# Patient Record
Sex: Male | Born: 1960 | Race: Black or African American | Hispanic: No | Marital: Single
Health system: Southern US, Academic
[De-identification: ages and names within clinical notes are randomized; demographics above are authoritative.]

## PROBLEM LIST (undated history)

## (undated) ENCOUNTER — Telehealth
Attending: Student in an Organized Health Care Education/Training Program | Primary: Student in an Organized Health Care Education/Training Program

## (undated) ENCOUNTER — Ambulatory Visit
Attending: Student in an Organized Health Care Education/Training Program | Primary: Student in an Organized Health Care Education/Training Program

## (undated) ENCOUNTER — Encounter

## (undated) ENCOUNTER — Encounter: Attending: Hematology & Oncology | Primary: Hematology & Oncology

## (undated) ENCOUNTER — Ambulatory Visit

## (undated) ENCOUNTER — Telehealth: Attending: Family | Primary: Family

## (undated) ENCOUNTER — Encounter
Attending: Student in an Organized Health Care Education/Training Program | Primary: Student in an Organized Health Care Education/Training Program

## (undated) ENCOUNTER — Telehealth

## (undated) ENCOUNTER — Ambulatory Visit
Payer: PRIVATE HEALTH INSURANCE | Attending: Student in an Organized Health Care Education/Training Program | Primary: Student in an Organized Health Care Education/Training Program

## (undated) ENCOUNTER — Ambulatory Visit: Payer: PRIVATE HEALTH INSURANCE | Attending: Hospice and Palliative Medicine | Primary: Hospice and Palliative Medicine

## (undated) ENCOUNTER — Ambulatory Visit: Payer: PRIVATE HEALTH INSURANCE | Attending: Registered" | Primary: Registered"

## (undated) ENCOUNTER — Ambulatory Visit: Payer: PRIVATE HEALTH INSURANCE

## (undated) ENCOUNTER — Telehealth: Attending: Hematology & Oncology | Primary: Hematology & Oncology

## (undated) ENCOUNTER — Encounter: Attending: Radiation Oncology | Primary: Radiation Oncology

## (undated) ENCOUNTER — Encounter
Payer: PRIVATE HEALTH INSURANCE | Attending: Student in an Organized Health Care Education/Training Program | Primary: Student in an Organized Health Care Education/Training Program

## (undated) ENCOUNTER — Inpatient Hospital Stay

## (undated) ENCOUNTER — Ambulatory Visit: Attending: Hematology & Oncology | Primary: Hematology & Oncology

## (undated) ENCOUNTER — Telehealth: Attending: Radiation Oncology | Primary: Radiation Oncology

## (undated) ENCOUNTER — Encounter: Payer: PRIVATE HEALTH INSURANCE | Attending: Hospice and Palliative Medicine | Primary: Hospice and Palliative Medicine

## (undated) ENCOUNTER — Ambulatory Visit: Attending: Radiation Oncology | Primary: Radiation Oncology

## (undated) ENCOUNTER — Ambulatory Visit: Payer: PRIVATE HEALTH INSURANCE | Attending: Geriatric Medicine | Primary: Geriatric Medicine

## (undated) ENCOUNTER — Ambulatory Visit: Attending: Surgery | Primary: Surgery

## (undated) DIAGNOSIS — K859 Acute pancreatitis without necrosis or infection, unspecified: Secondary | ICD-10-CM

## (undated) DIAGNOSIS — C801 Malignant (primary) neoplasm, unspecified: Secondary | ICD-10-CM

## (undated) DIAGNOSIS — F410 Panic disorder [episodic paroxysmal anxiety] without agoraphobia: Secondary | ICD-10-CM

## (undated) HISTORY — PX: KNEE SURGERY: SHX244

## (undated) HISTORY — DX: Panic disorder (episodic paroxysmal anxiety): F41.0

## (undated) MED ORDER — ZINC GLUCONATE 50 MG TABLET
Freq: Every day | ORAL | 0.00000 days
Start: ? — End: 2020-12-02

## (undated) MED ORDER — METFORMIN ER 500 MG TABLET,EXTENDED RELEASE 24 HR
Freq: Two times a day (BID) | ORAL | 0.00000 days | Status: SS
Start: ? — End: 2021-01-12

## (undated) MED ORDER — CHOLECALCIFEROL (VITAMIN D3) 10 MCG (400 UNIT) CAPSULE
Freq: Every day | ORAL | 0.00000 days
Start: ? — End: 2020-12-02

---
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
## 2004-04-25 ENCOUNTER — Emergency Department (HOSPITAL_COMMUNITY): Admission: EM | Admit: 2004-04-25 | Discharge: 2004-04-25 | Payer: Self-pay | Admitting: Emergency Medicine

## 2005-09-20 ENCOUNTER — Observation Stay (HOSPITAL_COMMUNITY): Admission: EM | Admit: 2005-09-20 | Discharge: 2005-09-21 | Payer: Self-pay | Admitting: Emergency Medicine

## 2005-09-21 ENCOUNTER — Ambulatory Visit: Payer: Self-pay | Admitting: *Deleted

## 2006-02-26 ENCOUNTER — Emergency Department (HOSPITAL_COMMUNITY): Admission: EM | Admit: 2006-02-26 | Discharge: 2006-02-26 | Payer: Self-pay | Admitting: Emergency Medicine

## 2006-08-25 ENCOUNTER — Ambulatory Visit (HOSPITAL_COMMUNITY): Admission: RE | Admit: 2006-08-25 | Discharge: 2006-08-25 | Payer: Self-pay | Admitting: Family Medicine

## 2007-06-15 ENCOUNTER — Emergency Department (HOSPITAL_COMMUNITY): Admission: EM | Admit: 2007-06-15 | Discharge: 2007-06-15 | Payer: Self-pay | Admitting: Emergency Medicine

## 2007-11-23 DIAGNOSIS — K859 Acute pancreatitis without necrosis or infection, unspecified: Secondary | ICD-10-CM

## 2007-11-23 HISTORY — DX: Acute pancreatitis without necrosis or infection, unspecified: K85.90

## 2007-11-23 HISTORY — PX: COLONOSCOPY: SHX174

## 2009-02-14 ENCOUNTER — Emergency Department (HOSPITAL_COMMUNITY): Admission: EM | Admit: 2009-02-14 | Discharge: 2009-02-14 | Payer: Self-pay | Admitting: Emergency Medicine

## 2009-02-14 ENCOUNTER — Ambulatory Visit: Payer: Self-pay | Admitting: Cardiology

## 2009-02-15 ENCOUNTER — Inpatient Hospital Stay (HOSPITAL_COMMUNITY): Admission: EM | Admit: 2009-02-15 | Discharge: 2009-03-05 | Payer: Self-pay | Admitting: Emergency Medicine

## 2009-02-15 ENCOUNTER — Ambulatory Visit: Payer: Self-pay | Admitting: Gastroenterology

## 2009-02-17 ENCOUNTER — Ambulatory Visit: Payer: Self-pay | Admitting: Gastroenterology

## 2009-02-18 ENCOUNTER — Ambulatory Visit: Payer: Self-pay | Admitting: Gastroenterology

## 2009-02-20 ENCOUNTER — Ambulatory Visit: Payer: Self-pay | Admitting: Gastroenterology

## 2009-02-21 ENCOUNTER — Ambulatory Visit: Payer: Self-pay | Admitting: Gastroenterology

## 2009-02-24 ENCOUNTER — Encounter (INDEPENDENT_AMBULATORY_CARE_PROVIDER_SITE_OTHER): Payer: Self-pay | Admitting: Internal Medicine

## 2009-02-25 ENCOUNTER — Ambulatory Visit: Payer: Self-pay | Admitting: Internal Medicine

## 2009-02-26 ENCOUNTER — Ambulatory Visit: Payer: Self-pay | Admitting: Internal Medicine

## 2009-02-27 ENCOUNTER — Encounter: Payer: Self-pay | Admitting: Gastroenterology

## 2009-02-28 ENCOUNTER — Ambulatory Visit: Payer: Self-pay | Admitting: Gastroenterology

## 2009-03-01 ENCOUNTER — Ambulatory Visit: Payer: Self-pay | Admitting: Internal Medicine

## 2009-03-01 ENCOUNTER — Encounter: Payer: Self-pay | Admitting: Internal Medicine

## 2009-03-03 ENCOUNTER — Ambulatory Visit: Payer: Self-pay | Admitting: Internal Medicine

## 2009-03-04 ENCOUNTER — Ambulatory Visit: Payer: Self-pay | Admitting: Internal Medicine

## 2009-03-10 ENCOUNTER — Encounter (INDEPENDENT_AMBULATORY_CARE_PROVIDER_SITE_OTHER): Payer: Self-pay

## 2009-03-10 ENCOUNTER — Encounter: Payer: Self-pay | Admitting: Internal Medicine

## 2009-03-10 DIAGNOSIS — D649 Anemia, unspecified: Secondary | ICD-10-CM

## 2009-04-22 ENCOUNTER — Telehealth: Payer: Self-pay | Admitting: Gastroenterology

## 2010-01-13 IMAGING — CR DG CHEST 2V
2 series · 2 of 2 positions shown · non-contrast
Comparison: 02/25/2009.

CLINICAL DATA: Pneumonia.  Acute pancreatitis.

CHEST - 2 VIEW

[view not recorded (1 of 2)]
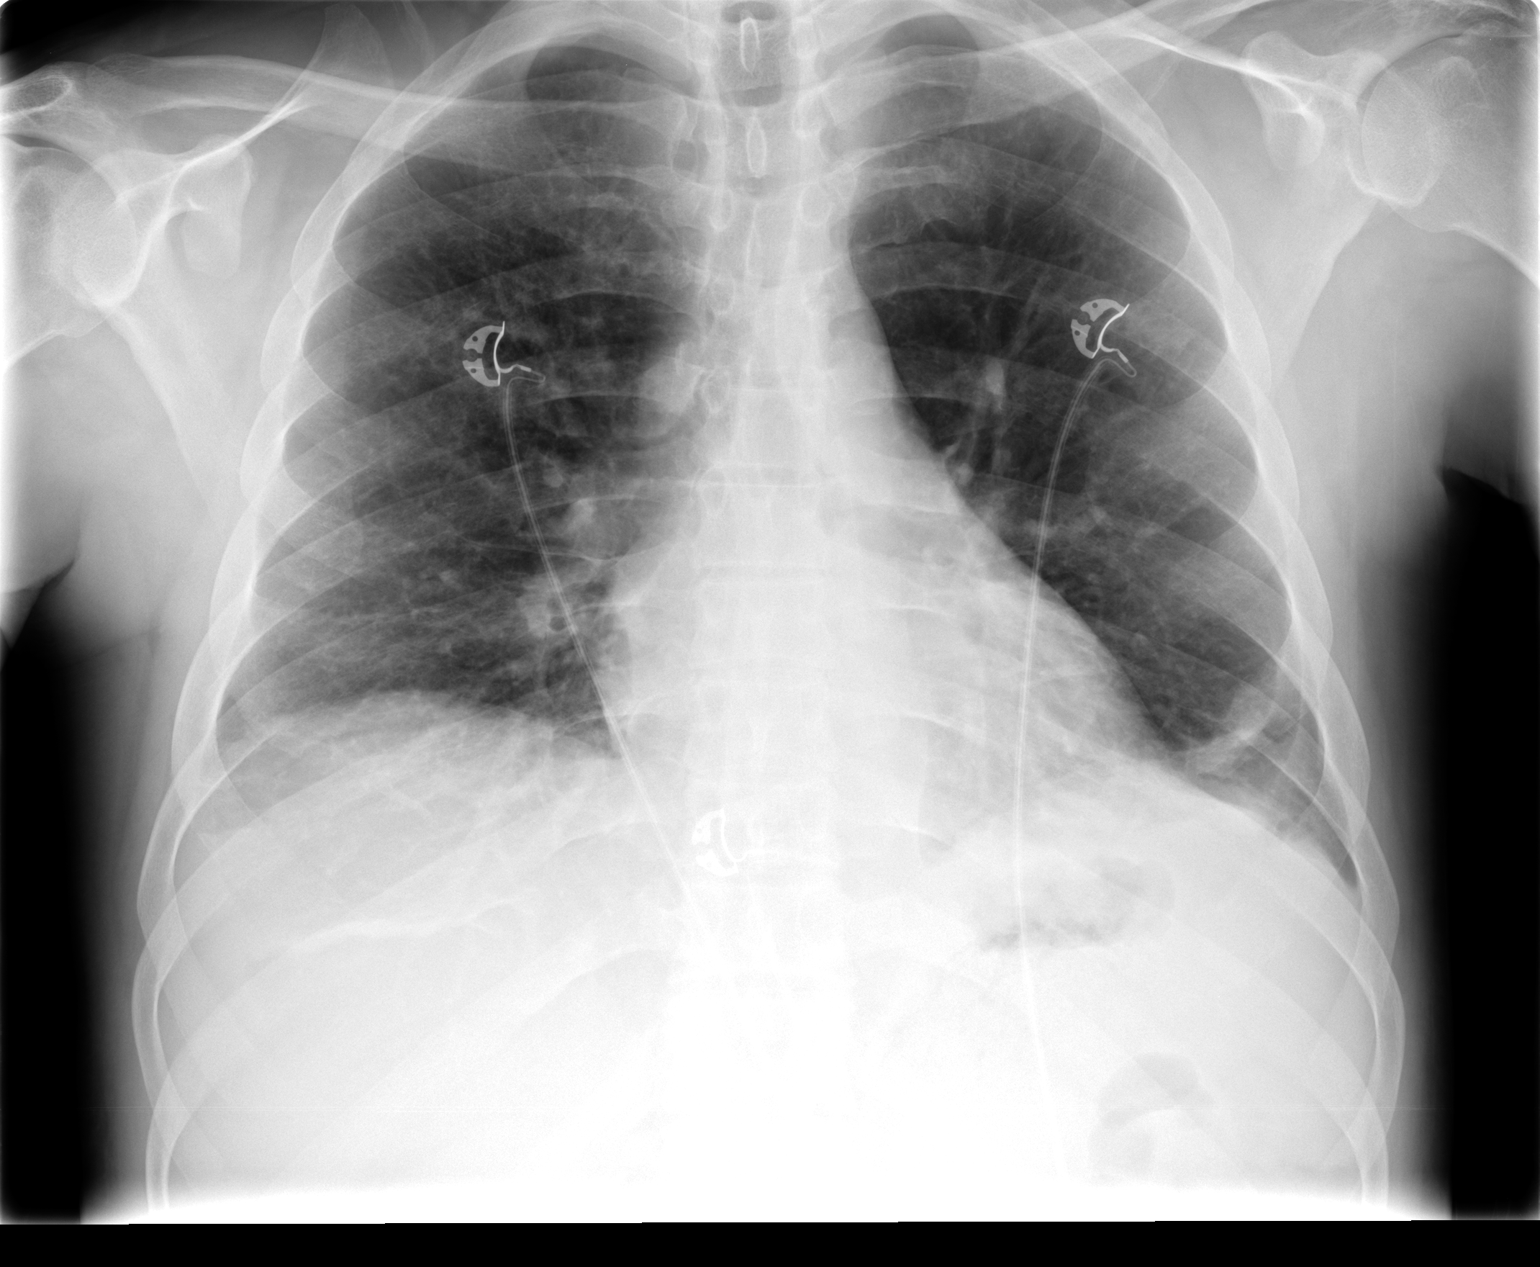

[view not recorded (2 of 2)]
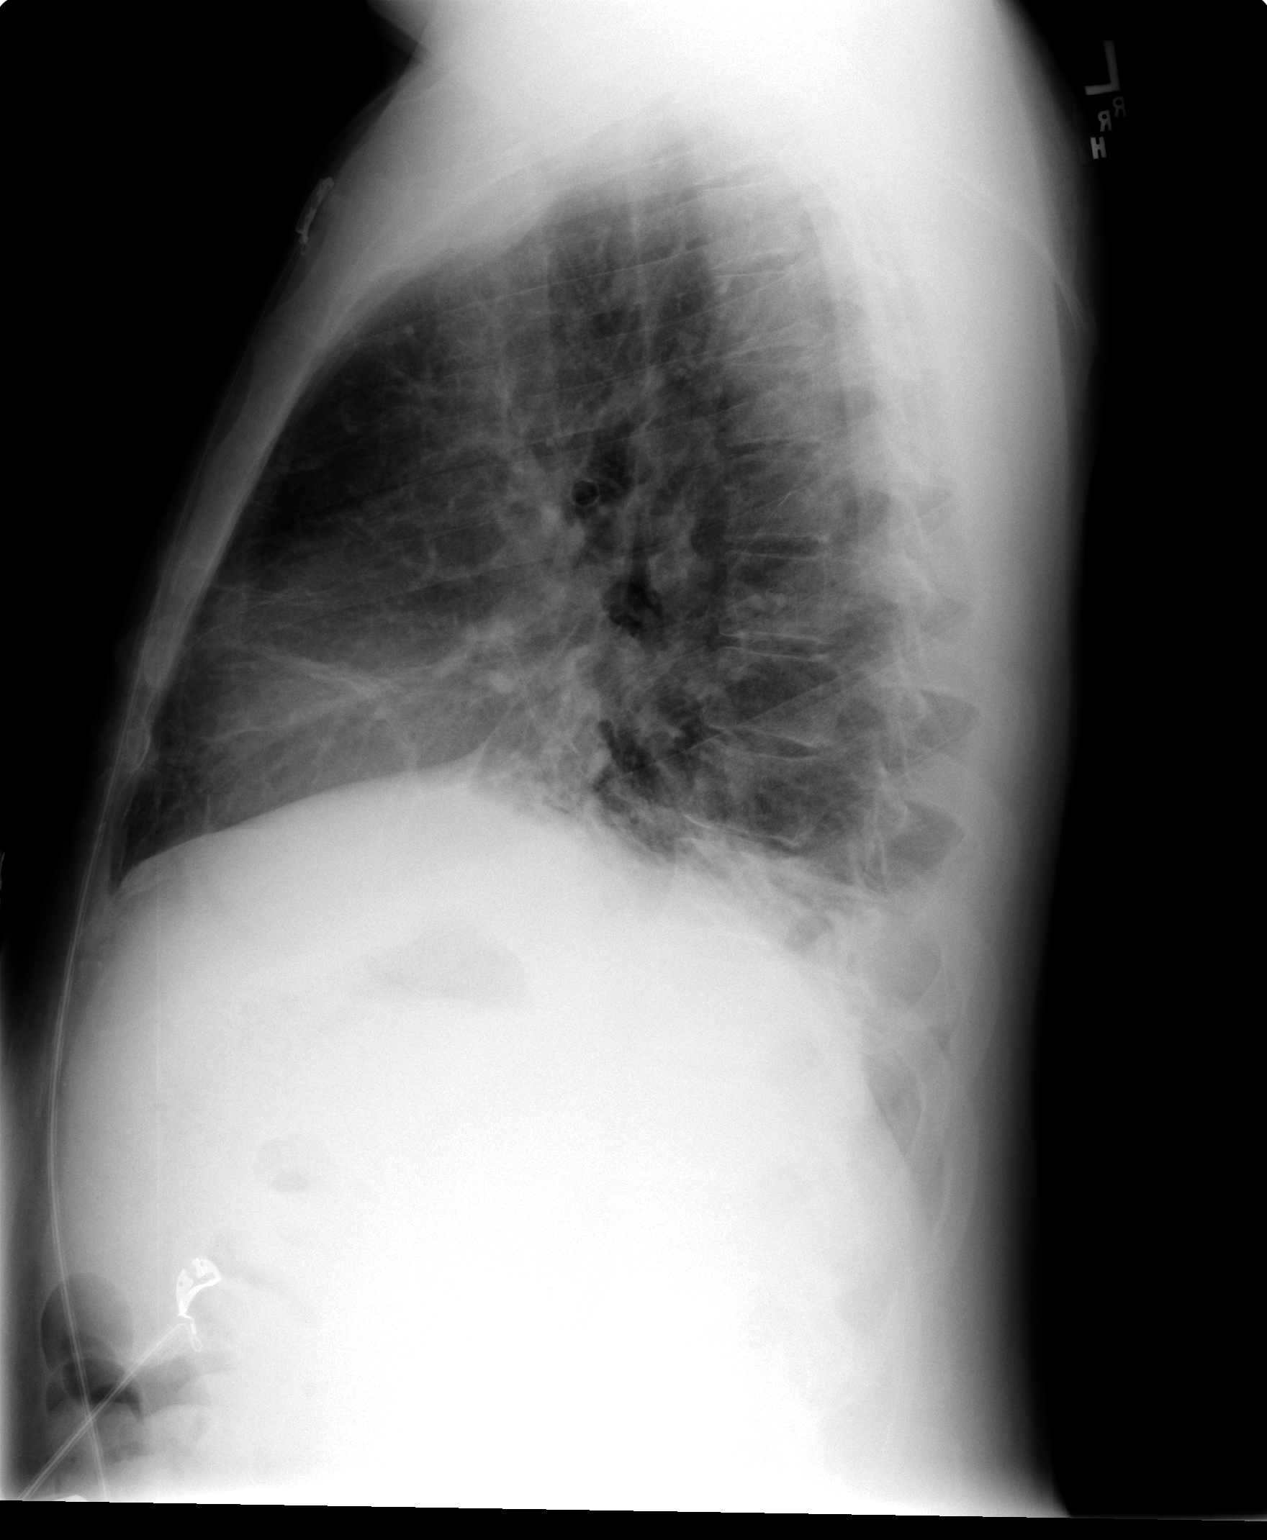

[2 of 2 positions shown; findings below may reference images not displayed]

FINDINGS: Lung volumes are low.  There is bibasilar atelectasis
with tiny bilateral pleural effusions although basilar aeration
does appear improved in the interval.  The diffuse airspace disease
seen previously in the right lung has almost resolved. The
cardiopericardial silhouette is within normal limits for size.
Telemetry leads overlie the chest.
IMPRESSION: Near complete resolution of the fairly diffuse right sided airspace
disease seen previously.

Persistent but improved bibasilar atelectasis and small bilateral
pleural effusions.

## 2010-12-12 ENCOUNTER — Encounter: Payer: Self-pay | Admitting: Family Medicine

## 2011-03-03 LAB — CBC
HCT: 23.5 % — ABNORMAL LOW (ref 39.0–52.0)
HCT: 24 % — ABNORMAL LOW (ref 39.0–52.0)
HCT: 25.1 % — ABNORMAL LOW (ref 39.0–52.0)
HCT: 25.6 % — ABNORMAL LOW (ref 39.0–52.0)
HCT: 26.3 % — ABNORMAL LOW (ref 39.0–52.0)
HCT: 26.4 % — ABNORMAL LOW (ref 39.0–52.0)
HCT: 26.5 % — ABNORMAL LOW (ref 39.0–52.0)
HCT: 26.6 % — ABNORMAL LOW (ref 39.0–52.0)
HCT: 26.9 % — ABNORMAL LOW (ref 39.0–52.0)
HCT: 27 % — ABNORMAL LOW (ref 39.0–52.0)
HCT: 27.1 % — ABNORMAL LOW (ref 39.0–52.0)
HCT: 27.2 % — ABNORMAL LOW (ref 39.0–52.0)
HCT: 27.3 % — ABNORMAL LOW (ref 39.0–52.0)
Hemoglobin: 8.1 g/dL — ABNORMAL LOW (ref 13.0–17.0)
Hemoglobin: 8.4 g/dL — ABNORMAL LOW (ref 13.0–17.0)
Hemoglobin: 8.9 g/dL — ABNORMAL LOW (ref 13.0–17.0)
Hemoglobin: 9 g/dL — ABNORMAL LOW (ref 13.0–17.0)
Hemoglobin: 9.2 g/dL — ABNORMAL LOW (ref 13.0–17.0)
Hemoglobin: 9.2 g/dL — ABNORMAL LOW (ref 13.0–17.0)
Hemoglobin: 9.3 g/dL — ABNORMAL LOW (ref 13.0–17.0)
Hemoglobin: 9.4 g/dL — ABNORMAL LOW (ref 13.0–17.0)
Hemoglobin: 9.4 g/dL — ABNORMAL LOW (ref 13.0–17.0)
Hemoglobin: 9.4 g/dL — ABNORMAL LOW (ref 13.0–17.0)
Hemoglobin: 9.5 g/dL — ABNORMAL LOW (ref 13.0–17.0)
Hemoglobin: 9.6 g/dL — ABNORMAL LOW (ref 13.0–17.0)
MCHC: 33.6 g/dL (ref 30.0–36.0)
MCHC: 34.2 g/dL (ref 30.0–36.0)
MCHC: 34.3 g/dL (ref 30.0–36.0)
MCHC: 34.3 g/dL (ref 30.0–36.0)
MCHC: 34.4 g/dL (ref 30.0–36.0)
MCHC: 34.6 g/dL (ref 30.0–36.0)
MCHC: 34.7 g/dL (ref 30.0–36.0)
MCHC: 34.8 g/dL (ref 30.0–36.0)
MCHC: 34.8 g/dL (ref 30.0–36.0)
MCHC: 34.8 g/dL (ref 30.0–36.0)
MCHC: 34.9 g/dL (ref 30.0–36.0)
MCHC: 35 g/dL (ref 30.0–36.0)
MCHC: 35 g/dL (ref 30.0–36.0)
MCV: 95.1 fL (ref 78.0–100.0)
MCV: 95.8 fL (ref 78.0–100.0)
MCV: 96.1 fL (ref 78.0–100.0)
MCV: 96.3 fL (ref 78.0–100.0)
MCV: 96.3 fL (ref 78.0–100.0)
MCV: 96.6 fL (ref 78.0–100.0)
MCV: 97 fL (ref 78.0–100.0)
MCV: 98.5 fL (ref 78.0–100.0)
MCV: 98.7 fL (ref 78.0–100.0)
MCV: 98.9 fL (ref 78.0–100.0)
MCV: 99.5 fL (ref 78.0–100.0)
MCV: 99.6 fL (ref 78.0–100.0)
MCV: 99.8 fL (ref 78.0–100.0)
Platelets: 340 10*3/uL (ref 150–400)
Platelets: 412 10*3/uL — ABNORMAL HIGH (ref 150–400)
Platelets: 563 10*3/uL — ABNORMAL HIGH (ref 150–400)
Platelets: 617 10*3/uL — ABNORMAL HIGH (ref 150–400)
Platelets: 650 10*3/uL — ABNORMAL HIGH (ref 150–400)
Platelets: 675 10*3/uL — ABNORMAL HIGH (ref 150–400)
Platelets: 676 10*3/uL — ABNORMAL HIGH (ref 150–400)
Platelets: 703 10*3/uL — ABNORMAL HIGH (ref 150–400)
Platelets: 729 10*3/uL — ABNORMAL HIGH (ref 150–400)
Platelets: 744 10*3/uL — ABNORMAL HIGH (ref 150–400)
Platelets: 745 10*3/uL — ABNORMAL HIGH (ref 150–400)
Platelets: 759 10*3/uL — ABNORMAL HIGH (ref 150–400)
RBC: 2.38 MIL/uL — ABNORMAL LOW (ref 4.22–5.81)
RBC: 2.48 MIL/uL — ABNORMAL LOW (ref 4.22–5.81)
RBC: 2.52 MIL/uL — ABNORMAL LOW (ref 4.22–5.81)
RBC: 2.59 MIL/uL — ABNORMAL LOW (ref 4.22–5.81)
RBC: 2.66 MIL/uL — ABNORMAL LOW (ref 4.22–5.81)
RBC: 2.67 MIL/uL — ABNORMAL LOW (ref 4.22–5.81)
RBC: 2.71 MIL/uL — ABNORMAL LOW (ref 4.22–5.81)
RBC: 2.74 MIL/uL — ABNORMAL LOW (ref 4.22–5.81)
RBC: 2.78 MIL/uL — ABNORMAL LOW (ref 4.22–5.81)
RBC: 2.81 MIL/uL — ABNORMAL LOW (ref 4.22–5.81)
RBC: 2.82 MIL/uL — ABNORMAL LOW (ref 4.22–5.81)
RBC: 2.83 MIL/uL — ABNORMAL LOW (ref 4.22–5.81)
RBC: 2.84 MIL/uL — ABNORMAL LOW (ref 4.22–5.81)
RBC: 2.88 MIL/uL — ABNORMAL LOW (ref 4.22–5.81)
RDW: 16.2 % — ABNORMAL HIGH (ref 11.5–15.5)
RDW: 16.5 % — ABNORMAL HIGH (ref 11.5–15.5)
RDW: 16.6 % — ABNORMAL HIGH (ref 11.5–15.5)
RDW: 16.6 % — ABNORMAL HIGH (ref 11.5–15.5)
RDW: 16.7 % — ABNORMAL HIGH (ref 11.5–15.5)
RDW: 16.7 % — ABNORMAL HIGH (ref 11.5–15.5)
RDW: 18.2 % — ABNORMAL HIGH (ref 11.5–15.5)
RDW: 18.5 % — ABNORMAL HIGH (ref 11.5–15.5)
RDW: 18.6 % — ABNORMAL HIGH (ref 11.5–15.5)
RDW: 18.7 % — ABNORMAL HIGH (ref 11.5–15.5)
RDW: 18.7 % — ABNORMAL HIGH (ref 11.5–15.5)
RDW: 18.9 % — ABNORMAL HIGH (ref 11.5–15.5)
WBC: 10.2 10*3/uL (ref 4.0–10.5)
WBC: 11.5 10*3/uL — ABNORMAL HIGH (ref 4.0–10.5)
WBC: 12.3 10*3/uL — ABNORMAL HIGH (ref 4.0–10.5)
WBC: 13.5 10*3/uL — ABNORMAL HIGH (ref 4.0–10.5)
WBC: 13.7 10*3/uL — ABNORMAL HIGH (ref 4.0–10.5)
WBC: 15 10*3/uL — ABNORMAL HIGH (ref 4.0–10.5)
WBC: 16.2 10*3/uL — ABNORMAL HIGH (ref 4.0–10.5)
WBC: 16.7 10*3/uL — ABNORMAL HIGH (ref 4.0–10.5)
WBC: 18.2 10*3/uL — ABNORMAL HIGH (ref 4.0–10.5)
WBC: 19.3 10*3/uL — ABNORMAL HIGH (ref 4.0–10.5)
WBC: 19.3 10*3/uL — ABNORMAL HIGH (ref 4.0–10.5)
WBC: 8.8 10*3/uL (ref 4.0–10.5)
WBC: 9 10*3/uL (ref 4.0–10.5)

## 2011-03-03 LAB — DIFFERENTIAL
Basophils Absolute: 0 10*3/uL (ref 0.0–0.1)
Basophils Absolute: 0 10*3/uL (ref 0.0–0.1)
Basophils Absolute: 0 10*3/uL (ref 0.0–0.1)
Basophils Absolute: 0 10*3/uL (ref 0.0–0.1)
Basophils Absolute: 0 10*3/uL (ref 0.0–0.1)
Basophils Absolute: 0 10*3/uL (ref 0.0–0.1)
Basophils Absolute: 0 10*3/uL (ref 0.0–0.1)
Basophils Absolute: 0.1 10*3/uL (ref 0.0–0.1)
Basophils Absolute: 0.1 10*3/uL (ref 0.0–0.1)
Basophils Absolute: 0.1 10*3/uL (ref 0.0–0.1)
Basophils Absolute: 0.1 10*3/uL (ref 0.0–0.1)
Basophils Absolute: 0.1 10*3/uL (ref 0.0–0.1)
Basophils Absolute: 0.1 10*3/uL (ref 0.0–0.1)
Basophils Relative: 0 % (ref 0–1)
Basophils Relative: 0 % (ref 0–1)
Basophils Relative: 0 % (ref 0–1)
Basophils Relative: 0 % (ref 0–1)
Basophils Relative: 0 % (ref 0–1)
Basophils Relative: 0 % (ref 0–1)
Basophils Relative: 1 % (ref 0–1)
Basophils Relative: 1 % (ref 0–1)
Basophils Relative: 1 % (ref 0–1)
Basophils Relative: 1 % (ref 0–1)
Basophils Relative: 1 % (ref 0–1)
Basophils Relative: 1 % (ref 0–1)
Eosinophils Absolute: 0 10*3/uL (ref 0.0–0.7)
Eosinophils Absolute: 0.1 10*3/uL (ref 0.0–0.7)
Eosinophils Absolute: 0.1 10*3/uL (ref 0.0–0.7)
Eosinophils Absolute: 0.1 10*3/uL (ref 0.0–0.7)
Eosinophils Absolute: 0.1 10*3/uL (ref 0.0–0.7)
Eosinophils Absolute: 0.1 10*3/uL (ref 0.0–0.7)
Eosinophils Absolute: 0.1 10*3/uL (ref 0.0–0.7)
Eosinophils Absolute: 0.2 10*3/uL (ref 0.0–0.7)
Eosinophils Absolute: 0.2 10*3/uL (ref 0.0–0.7)
Eosinophils Absolute: 0.2 10*3/uL (ref 0.0–0.7)
Eosinophils Absolute: 0.2 10*3/uL (ref 0.0–0.7)
Eosinophils Absolute: 0.2 10*3/uL (ref 0.0–0.7)
Eosinophils Relative: 0 % (ref 0–5)
Eosinophils Relative: 1 % (ref 0–5)
Eosinophils Relative: 1 % (ref 0–5)
Eosinophils Relative: 1 % (ref 0–5)
Eosinophils Relative: 1 % (ref 0–5)
Eosinophils Relative: 1 % (ref 0–5)
Eosinophils Relative: 1 % (ref 0–5)
Eosinophils Relative: 1 % (ref 0–5)
Eosinophils Relative: 1 % (ref 0–5)
Eosinophils Relative: 1 % (ref 0–5)
Eosinophils Relative: 1 % (ref 0–5)
Eosinophils Relative: 2 % (ref 0–5)
Lymphocytes Relative: 10 % — ABNORMAL LOW (ref 12–46)
Lymphocytes Relative: 11 % — ABNORMAL LOW (ref 12–46)
Lymphocytes Relative: 11 % — ABNORMAL LOW (ref 12–46)
Lymphocytes Relative: 14 % (ref 12–46)
Lymphocytes Relative: 14 % (ref 12–46)
Lymphocytes Relative: 16 % (ref 12–46)
Lymphocytes Relative: 18 % (ref 12–46)
Lymphocytes Relative: 21 % (ref 12–46)
Lymphocytes Relative: 5 % — ABNORMAL LOW (ref 12–46)
Lymphocytes Relative: 5 % — ABNORMAL LOW (ref 12–46)
Lymphocytes Relative: 5 % — ABNORMAL LOW (ref 12–46)
Lymphocytes Relative: 5 % — ABNORMAL LOW (ref 12–46)
Lymphocytes Relative: 6 % — ABNORMAL LOW (ref 12–46)
Lymphocytes Relative: 7 % — ABNORMAL LOW (ref 12–46)
Lymphs Abs: 0.8 10*3/uL (ref 0.7–4.0)
Lymphs Abs: 0.8 10*3/uL (ref 0.7–4.0)
Lymphs Abs: 0.9 10*3/uL (ref 0.7–4.0)
Lymphs Abs: 1 10*3/uL (ref 0.7–4.0)
Lymphs Abs: 1.1 10*3/uL (ref 0.7–4.0)
Lymphs Abs: 1.2 10*3/uL (ref 0.7–4.0)
Lymphs Abs: 1.4 10*3/uL (ref 0.7–4.0)
Lymphs Abs: 1.4 10*3/uL (ref 0.7–4.0)
Lymphs Abs: 1.4 10*3/uL (ref 0.7–4.0)
Lymphs Abs: 1.4 10*3/uL (ref 0.7–4.0)
Lymphs Abs: 1.5 10*3/uL (ref 0.7–4.0)
Lymphs Abs: 1.5 10*3/uL (ref 0.7–4.0)
Lymphs Abs: 1.6 10*3/uL (ref 0.7–4.0)
Lymphs Abs: 1.7 10*3/uL (ref 0.7–4.0)
Monocytes Absolute: 0.3 10*3/uL (ref 0.1–1.0)
Monocytes Absolute: 0.5 10*3/uL (ref 0.1–1.0)
Monocytes Absolute: 0.6 10*3/uL (ref 0.1–1.0)
Monocytes Absolute: 0.6 10*3/uL (ref 0.1–1.0)
Monocytes Absolute: 0.6 10*3/uL (ref 0.1–1.0)
Monocytes Absolute: 0.7 10*3/uL (ref 0.1–1.0)
Monocytes Absolute: 0.7 10*3/uL (ref 0.1–1.0)
Monocytes Absolute: 0.8 10*3/uL (ref 0.1–1.0)
Monocytes Absolute: 0.8 10*3/uL (ref 0.1–1.0)
Monocytes Absolute: 0.8 10*3/uL (ref 0.1–1.0)
Monocytes Absolute: 0.8 10*3/uL (ref 0.1–1.0)
Monocytes Absolute: 0.8 10*3/uL (ref 0.1–1.0)
Monocytes Absolute: 0.9 10*3/uL (ref 0.1–1.0)
Monocytes Relative: 10 % (ref 3–12)
Monocytes Relative: 2 % — ABNORMAL LOW (ref 3–12)
Monocytes Relative: 3 % (ref 3–12)
Monocytes Relative: 4 % (ref 3–12)
Monocytes Relative: 4 % (ref 3–12)
Monocytes Relative: 5 % (ref 3–12)
Monocytes Relative: 6 % (ref 3–12)
Monocytes Relative: 6 % (ref 3–12)
Monocytes Relative: 6 % (ref 3–12)
Monocytes Relative: 6 % (ref 3–12)
Monocytes Relative: 7 % (ref 3–12)
Monocytes Relative: 7 % (ref 3–12)
Monocytes Relative: 9 % (ref 3–12)
Neutro Abs: 10 10*3/uL — ABNORMAL HIGH (ref 1.7–7.7)
Neutro Abs: 11.1 10*3/uL — ABNORMAL HIGH (ref 1.7–7.7)
Neutro Abs: 11.2 10*3/uL — ABNORMAL HIGH (ref 1.7–7.7)
Neutro Abs: 13.1 10*3/uL — ABNORMAL HIGH (ref 1.7–7.7)
Neutro Abs: 14.6 10*3/uL — ABNORMAL HIGH (ref 1.7–7.7)
Neutro Abs: 14.9 10*3/uL — ABNORMAL HIGH (ref 1.7–7.7)
Neutro Abs: 16.7 10*3/uL — ABNORMAL HIGH (ref 1.7–7.7)
Neutro Abs: 17.2 10*3/uL — ABNORMAL HIGH (ref 1.7–7.7)
Neutro Abs: 17.4 10*3/uL — ABNORMAL HIGH (ref 1.7–7.7)
Neutro Abs: 5.2 10*3/uL (ref 1.7–7.7)
Neutro Abs: 6.3 10*3/uL (ref 1.7–7.7)
Neutro Abs: 6.9 10*3/uL (ref 1.7–7.7)
Neutro Abs: 7.9 10*3/uL — ABNORMAL HIGH (ref 1.7–7.7)
Neutro Abs: 9 10*3/uL — ABNORMAL HIGH (ref 1.7–7.7)
Neutrophils Relative %: 72 % (ref 43–77)
Neutrophils Relative %: 76 % (ref 43–77)
Neutrophils Relative %: 78 % — ABNORMAL HIGH (ref 43–77)
Neutrophils Relative %: 78 % — ABNORMAL HIGH (ref 43–77)
Neutrophils Relative %: 82 % — ABNORMAL HIGH (ref 43–77)
Neutrophils Relative %: 82 % — ABNORMAL HIGH (ref 43–77)
Neutrophils Relative %: 83 % — ABNORMAL HIGH (ref 43–77)
Neutrophils Relative %: 87 % — ABNORMAL HIGH (ref 43–77)
Neutrophils Relative %: 89 % — ABNORMAL HIGH (ref 43–77)
Neutrophils Relative %: 89 % — ABNORMAL HIGH (ref 43–77)
Neutrophils Relative %: 90 % — ABNORMAL HIGH (ref 43–77)
Neutrophils Relative %: 91 % — ABNORMAL HIGH (ref 43–77)
Neutrophils Relative %: 92 % — ABNORMAL HIGH (ref 43–77)

## 2011-03-03 LAB — BASIC METABOLIC PANEL
BUN: 2 mg/dL — ABNORMAL LOW (ref 6–23)
BUN: 3 mg/dL — ABNORMAL LOW (ref 6–23)
BUN: 5 mg/dL — ABNORMAL LOW (ref 6–23)
BUN: 5 mg/dL — ABNORMAL LOW (ref 6–23)
BUN: 5 mg/dL — ABNORMAL LOW (ref 6–23)
BUN: 5 mg/dL — ABNORMAL LOW (ref 6–23)
BUN: 5 mg/dL — ABNORMAL LOW (ref 6–23)
BUN: 5 mg/dL — ABNORMAL LOW (ref 6–23)
BUN: 6 mg/dL (ref 6–23)
BUN: 6 mg/dL (ref 6–23)
CO2: 22 mEq/L (ref 19–32)
CO2: 22 mEq/L (ref 19–32)
CO2: 23 mEq/L (ref 19–32)
CO2: 23 mEq/L (ref 19–32)
CO2: 24 mEq/L (ref 19–32)
CO2: 25 mEq/L (ref 19–32)
CO2: 26 mEq/L (ref 19–32)
CO2: 27 mEq/L (ref 19–32)
CO2: 28 mEq/L (ref 19–32)
CO2: 28 mEq/L (ref 19–32)
Calcium: 8 mg/dL — ABNORMAL LOW (ref 8.4–10.5)
Calcium: 8 mg/dL — ABNORMAL LOW (ref 8.4–10.5)
Calcium: 8.1 mg/dL — ABNORMAL LOW (ref 8.4–10.5)
Calcium: 8.1 mg/dL — ABNORMAL LOW (ref 8.4–10.5)
Calcium: 8.3 mg/dL — ABNORMAL LOW (ref 8.4–10.5)
Calcium: 8.3 mg/dL — ABNORMAL LOW (ref 8.4–10.5)
Calcium: 8.3 mg/dL — ABNORMAL LOW (ref 8.4–10.5)
Calcium: 8.4 mg/dL (ref 8.4–10.5)
Calcium: 8.6 mg/dL (ref 8.4–10.5)
Calcium: 8.6 mg/dL (ref 8.4–10.5)
Chloride: 101 mEq/L (ref 96–112)
Chloride: 103 mEq/L (ref 96–112)
Chloride: 104 mEq/L (ref 96–112)
Chloride: 104 mEq/L (ref 96–112)
Chloride: 104 mEq/L (ref 96–112)
Chloride: 106 mEq/L (ref 96–112)
Chloride: 106 mEq/L (ref 96–112)
Chloride: 106 mEq/L (ref 96–112)
Chloride: 106 mEq/L (ref 96–112)
Chloride: 108 mEq/L (ref 96–112)
Creatinine, Ser: 0.76 mg/dL (ref 0.4–1.5)
Creatinine, Ser: 0.85 mg/dL (ref 0.4–1.5)
Creatinine, Ser: 0.86 mg/dL (ref 0.4–1.5)
Creatinine, Ser: 0.9 mg/dL (ref 0.4–1.5)
Creatinine, Ser: 0.9 mg/dL (ref 0.4–1.5)
Creatinine, Ser: 0.92 mg/dL (ref 0.4–1.5)
Creatinine, Ser: 0.96 mg/dL (ref 0.4–1.5)
Creatinine, Ser: 0.96 mg/dL (ref 0.4–1.5)
Creatinine, Ser: 0.97 mg/dL (ref 0.4–1.5)
Creatinine, Ser: 0.98 mg/dL (ref 0.4–1.5)
GFR calc Af Amer: >60 mL/min (ref >60)
GFR calc Af Amer: >60 mL/min (ref >60)
GFR calc Af Amer: >60 mL/min (ref >60)
GFR calc Af Amer: >60 mL/min (ref >60)
GFR calc Af Amer: >60 mL/min (ref >60)
GFR calc Af Amer: >60 mL/min (ref >60)
GFR calc Af Amer: >60 mL/min (ref >60)
GFR calc Af Amer: >60 mL/min (ref >60)
GFR calc Af Amer: >60 mL/min (ref >60)
GFR calc Af Amer: >60 mL/min (ref >60)
GFR calc non Af Amer: >60 mL/min (ref >60)
GFR calc non Af Amer: >60 mL/min (ref >60)
GFR calc non Af Amer: >60 mL/min (ref >60)
GFR calc non Af Amer: >60 mL/min (ref >60)
GFR calc non Af Amer: >60 mL/min (ref >60)
GFR calc non Af Amer: >60 mL/min (ref >60)
GFR calc non Af Amer: >60 mL/min (ref >60)
GFR calc non Af Amer: >60 mL/min (ref >60)
GFR calc non Af Amer: >60 mL/min (ref >60)
GFR calc non Af Amer: >60 mL/min (ref >60)
Glucose, Bld: 121 mg/dL — ABNORMAL HIGH (ref 70–99)
Glucose, Bld: 127 mg/dL — ABNORMAL HIGH (ref 70–99)
Glucose, Bld: 128 mg/dL — ABNORMAL HIGH (ref 70–99)
Glucose, Bld: 129 mg/dL — ABNORMAL HIGH (ref 70–99)
Glucose, Bld: 136 mg/dL — ABNORMAL HIGH (ref 70–99)
Glucose, Bld: 147 mg/dL — ABNORMAL HIGH (ref 70–99)
Glucose, Bld: 149 mg/dL — ABNORMAL HIGH (ref 70–99)
Glucose, Bld: 157 mg/dL — ABNORMAL HIGH (ref 70–99)
Glucose, Bld: 173 mg/dL — ABNORMAL HIGH (ref 70–99)
Glucose, Bld: 178 mg/dL — ABNORMAL HIGH (ref 70–99)
Potassium: 3.1 mEq/L — ABNORMAL LOW (ref 3.5–5.1)
Potassium: 3.3 mEq/L — ABNORMAL LOW (ref 3.5–5.1)
Potassium: 3.4 mEq/L — ABNORMAL LOW (ref 3.5–5.1)
Potassium: 3.4 mEq/L — ABNORMAL LOW (ref 3.5–5.1)
Potassium: 3.5 mEq/L (ref 3.5–5.1)
Potassium: 3.6 mEq/L (ref 3.5–5.1)
Potassium: 3.6 mEq/L (ref 3.5–5.1)
Potassium: 3.7 mEq/L (ref 3.5–5.1)
Potassium: 4.3 mEq/L (ref 3.5–5.1)
Potassium: 4.4 mEq/L (ref 3.5–5.1)
Sodium: 133 mEq/L — ABNORMAL LOW (ref 135–145)
Sodium: 135 mEq/L (ref 135–145)
Sodium: 135 mEq/L (ref 135–145)
Sodium: 136 mEq/L (ref 135–145)
Sodium: 136 mEq/L (ref 135–145)
Sodium: 136 mEq/L (ref 135–145)
Sodium: 136 mEq/L (ref 135–145)
Sodium: 136 mEq/L (ref 135–145)
Sodium: 136 mEq/L (ref 135–145)
Sodium: 137 mEq/L (ref 135–145)

## 2011-03-03 LAB — URINALYSIS, ROUTINE W REFLEX MICROSCOPIC
Bilirubin Urine: NEGATIVE
Glucose, UA: 100 mg/dL — AB
Ketones, ur: NEGATIVE mg/dL
Leukocytes, UA: NEGATIVE
Protein, ur: 30 mg/dL — AB

## 2011-03-03 LAB — HEPATIC FUNCTION PANEL
ALT: 9 U/L (ref 0–53)
Alkaline Phosphatase: 40 U/L (ref 39–117)
Indirect Bilirubin: 0.7 mg/dL (ref 0.3–0.9)
Total Bilirubin: 1 mg/dL (ref 0.3–1.2)
Total Protein: 6.3 g/dL (ref 6.0–8.3)

## 2011-03-03 LAB — IRON AND TIBC
Saturation Ratios: 40 % (ref 20–55)
UIBC: 90 ug/dL

## 2011-03-03 LAB — CLOSTRIDIUM DIFFICILE EIA: C difficile Toxins A+B, EIA: NEGATIVE

## 2011-03-03 LAB — COMPREHENSIVE METABOLIC PANEL
ALT: 9 U/L (ref 0–53)
AST: 30 U/L (ref 0–37)
Albumin: 2.2 g/dL — ABNORMAL LOW (ref 3.5–5.2)
Alkaline Phosphatase: 59 U/L (ref 39–117)
BUN: 2 mg/dL — ABNORMAL LOW (ref 6–23)
BUN: 5 mg/dL — ABNORMAL LOW (ref 6–23)
CO2: 22 mEq/L (ref 19–32)
CO2: 28 mEq/L (ref 19–32)
Calcium: 8.2 mg/dL — ABNORMAL LOW (ref 8.4–10.5)
Calcium: 8.3 mg/dL — ABNORMAL LOW (ref 8.4–10.5)
Chloride: 104 mEq/L (ref 96–112)
Chloride: 98 mEq/L (ref 96–112)
Creatinine, Ser: 0.82 mg/dL (ref 0.4–1.5)
Creatinine, Ser: 0.95 mg/dL (ref 0.4–1.5)
GFR calc Af Amer: >60 mL/min (ref >60)
GFR calc non Af Amer: >60 mL/min (ref >60)
GFR calc non Af Amer: >60 mL/min (ref >60)
Glucose, Bld: 118 mg/dL — ABNORMAL HIGH (ref 70–99)
Potassium: 3.9 mEq/L (ref 3.5–5.1)
Sodium: 132 mEq/L — ABNORMAL LOW (ref 135–145)
Total Bilirubin: 0.9 mg/dL (ref 0.3–1.2)
Total Bilirubin: 0.9 mg/dL (ref 0.3–1.2)
Total Protein: 7.1 g/dL (ref 6.0–8.3)

## 2011-03-03 LAB — OCCULT BLOOD (STOOL CUP TO LAB): Fecal Occult Bld: NEGATIVE

## 2011-03-03 LAB — CROSSMATCH
ABO/RH(D): O POS
ABO/RH(D): O POS
Antibody Screen: NEGATIVE

## 2011-03-03 LAB — LIPASE, BLOOD
Lipase: 24 U/L (ref 11–59)
Lipase: 31 U/L (ref 11–59)

## 2011-03-03 LAB — CULTURE, BLOOD (ROUTINE X 2)
Culture: NO GROWTH
Report Status: 4062010

## 2011-03-03 LAB — HEMOGLOBIN AND HEMATOCRIT, BLOOD
HCT: 27.7 % — ABNORMAL LOW (ref 39.0–52.0)
Hemoglobin: 9.6 g/dL — ABNORMAL LOW (ref 13.0–17.0)

## 2011-03-03 LAB — HEMOGLOBIN A1C
Hgb A1c MFr Bld: 5.5 % (ref 4.6–6.1)
Mean Plasma Glucose: 111 mg/dL

## 2011-03-03 LAB — VANCOMYCIN, TROUGH
Vancomycin Tr: 12.7 ug/mL (ref 10.0–20.0)
Vancomycin Tr: <5 ug/mL — ABNORMAL LOW (ref 10.0–20.0)

## 2011-03-03 LAB — URINE CULTURE: Colony Count: NO GROWTH

## 2011-03-03 LAB — URINE MICROSCOPIC-ADD ON

## 2011-03-03 LAB — HIV ANTIBODY (ROUTINE TESTING W REFLEX): HIV: NONREACTIVE

## 2011-03-03 LAB — RETICULOCYTES: Retic Ct Pct: 3.4 % — ABNORMAL HIGH (ref 0.4–3.1)

## 2011-03-03 LAB — MAGNESIUM: Magnesium: 1.6 mg/dL (ref 1.5–2.5)

## 2011-03-03 LAB — POTASSIUM: Potassium: 3.2 mEq/L — ABNORMAL LOW (ref 3.5–5.1)

## 2011-03-03 LAB — B. BURGDORFI ANTIBODIES: B burgdorferi Ab IgG+IgM: 0.1 {ISR}

## 2011-03-04 LAB — DIFFERENTIAL
Band Neutrophils: 20 % — ABNORMAL HIGH (ref 0–10)
Band Neutrophils: 8 % (ref 0–10)
Basophils Absolute: 0 10*3/uL (ref 0.0–0.1)
Basophils Absolute: 0 10*3/uL (ref 0.0–0.1)
Basophils Relative: 0 % (ref 0–1)
Basophils Relative: 0 % (ref 0–1)
Basophils Relative: 0 % (ref 0–1)
Basophils Relative: 0 % (ref 0–1)
Blasts: 0 %
Eosinophils Absolute: 0 10*3/uL (ref 0.0–0.7)
Eosinophils Absolute: 0 10*3/uL (ref 0.0–0.7)
Eosinophils Absolute: 0.1 10*3/uL (ref 0.0–0.7)
Eosinophils Relative: 0 % (ref 0–5)
Eosinophils Relative: 1 % (ref 0–5)
Eosinophils Relative: 1 % (ref 0–5)
Lymphocytes Relative: 12 % (ref 12–46)
Lymphocytes Relative: 6 % — ABNORMAL LOW (ref 12–46)
Lymphocytes Relative: 8 % — ABNORMAL LOW (ref 12–46)
Lymphs Abs: 0.9 10*3/uL (ref 0.7–4.0)
Lymphs Abs: 1 10*3/uL (ref 0.7–4.0)
Lymphs Abs: 1.3 10*3/uL (ref 0.7–4.0)
Lymphs Abs: 1.8 10*3/uL (ref 0.7–4.0)
Monocytes Absolute: 0.8 10*3/uL (ref 0.1–1.0)
Monocytes Relative: 6 % (ref 3–12)
Monocytes Relative: 7 % (ref 3–12)
Monocytes Relative: 7 % (ref 3–12)
Myelocytes: 0 %
Neutro Abs: 13.3 10*3/uL — ABNORMAL HIGH (ref 1.7–7.7)
Neutro Abs: 15.8 10*3/uL — ABNORMAL HIGH (ref 1.7–7.7)
Neutro Abs: 9.1 10*3/uL — ABNORMAL HIGH (ref 1.7–7.7)
Neutrophils Relative %: 86 % — ABNORMAL HIGH (ref 43–77)
Neutrophils Relative %: 87 % — ABNORMAL HIGH (ref 43–77)
Neutrophils Relative %: 91 % — ABNORMAL HIGH (ref 43–77)
Promyelocytes Absolute: 0 %
WBC Morphology: INCREASED

## 2011-03-04 LAB — LEGIONELLA ANTIGEN, URINE: Legionella Antigen, Urine: NEGATIVE

## 2011-03-04 LAB — CROSSMATCH
ABO/RH(D): O POS
Antibody Screen: NEGATIVE

## 2011-03-04 LAB — CULTURE, BLOOD (ROUTINE X 2)
Culture: NO GROWTH
Culture: NO GROWTH
Report Status: 4012010
Report Status: 4032010
Report Status: 4032010

## 2011-03-04 LAB — HEPATIC FUNCTION PANEL
ALT: 13 U/L (ref 0–53)
AST: 47 U/L — ABNORMAL HIGH (ref 0–37)
Albumin: 2.9 g/dL — ABNORMAL LOW (ref 3.5–5.2)
Alkaline Phosphatase: 47 U/L (ref 39–117)
Bilirubin, Direct: 0.4 mg/dL — ABNORMAL HIGH (ref 0.0–0.3)
Indirect Bilirubin: 1.3 mg/dL — ABNORMAL HIGH (ref 0.3–0.9)
Total Bilirubin: 1.7 mg/dL — ABNORMAL HIGH (ref 0.3–1.2)
Total Protein: 7.5 g/dL (ref 6.0–8.3)

## 2011-03-04 LAB — HEMOGLOBIN AND HEMATOCRIT, BLOOD
HCT: 25.5 % — ABNORMAL LOW (ref 39.0–52.0)
HCT: 27.9 % — ABNORMAL LOW (ref 39.0–52.0)
HCT: 29.1 % — ABNORMAL LOW (ref 39.0–52.0)
HCT: 29.6 % — ABNORMAL LOW (ref 39.0–52.0)
Hemoglobin: 10.1 g/dL — ABNORMAL LOW (ref 13.0–17.0)
Hemoglobin: 10.4 g/dL — ABNORMAL LOW (ref 13.0–17.0)
Hemoglobin: 8.9 g/dL — ABNORMAL LOW (ref 13.0–17.0)
Hemoglobin: 9.8 g/dL — ABNORMAL LOW (ref 13.0–17.0)

## 2011-03-04 LAB — CBC
HCT: 36.9 % — ABNORMAL LOW (ref 39.0–52.0)
HCT: 37.7 % — ABNORMAL LOW (ref 39.0–52.0)
HCT: 37.8 % — ABNORMAL LOW (ref 39.0–52.0)
Hemoglobin: 12.8 g/dL — ABNORMAL LOW (ref 13.0–17.0)
Hemoglobin: 9.4 g/dL — ABNORMAL LOW (ref 13.0–17.0)
MCHC: 34.5 g/dL (ref 30.0–36.0)
MCHC: 35.1 g/dL (ref 30.0–36.0)
MCHC: 35.2 g/dL (ref 30.0–36.0)
MCHC: 35.3 g/dL (ref 30.0–36.0)
MCV: 101.1 fL — ABNORMAL HIGH (ref 78.0–100.0)
MCV: 102.7 fL — ABNORMAL HIGH (ref 78.0–100.0)
Platelets: 203 10*3/uL (ref 150–400)
Platelets: 218 10*3/uL (ref 150–400)
Platelets: 237 10*3/uL (ref 150–400)
Platelets: 285 10*3/uL (ref 150–400)
RBC: 2.66 MIL/uL — ABNORMAL LOW (ref 4.22–5.81)
RBC: 2.94 MIL/uL — ABNORMAL LOW (ref 4.22–5.81)
RBC: 3.6 MIL/uL — ABNORMAL LOW (ref 4.22–5.81)
RDW: 12.9 % (ref 11.5–15.5)
RDW: 13.5 % (ref 11.5–15.5)
RDW: 13.8 % (ref 11.5–15.5)
WBC: 10.2 10*3/uL (ref 4.0–10.5)
WBC: 18.3 10*3/uL — ABNORMAL HIGH (ref 4.0–10.5)

## 2011-03-04 LAB — COMPREHENSIVE METABOLIC PANEL
ALT: 10 U/L (ref 0–53)
ALT: 18 U/L (ref 0–53)
ALT: 8 U/L (ref 0–53)
AST: 24 U/L (ref 0–37)
AST: 48 U/L — ABNORMAL HIGH (ref 0–37)
Albumin: 2 g/dL — ABNORMAL LOW (ref 3.5–5.2)
Albumin: 2.3 g/dL — ABNORMAL LOW (ref 3.5–5.2)
Albumin: 3.7 g/dL (ref 3.5–5.2)
Alkaline Phosphatase: 27 U/L — ABNORMAL LOW (ref 39–117)
Alkaline Phosphatase: 33 U/L — ABNORMAL LOW (ref 39–117)
Alkaline Phosphatase: 64 U/L (ref 39–117)
BUN: 31 mg/dL — ABNORMAL HIGH (ref 6–23)
BUN: 4 mg/dL — ABNORMAL LOW (ref 6–23)
BUN: 7 mg/dL (ref 6–23)
CO2: 23 mEq/L (ref 19–32)
CO2: 24 mEq/L (ref 19–32)
CO2: 27 mEq/L (ref 19–32)
Calcium: 8 mg/dL — ABNORMAL LOW (ref 8.4–10.5)
Calcium: 9.6 mg/dL (ref 8.4–10.5)
Chloride: 101 mEq/L (ref 96–112)
Chloride: 97 mEq/L (ref 96–112)
Creatinine, Ser: 0.87 mg/dL (ref 0.4–1.5)
Creatinine, Ser: 1.7 mg/dL — ABNORMAL HIGH (ref 0.4–1.5)
GFR calc Af Amer: >60 mL/min (ref >60)
GFR calc Af Amer: >60 mL/min (ref >60)
GFR calc Af Amer: >60 mL/min (ref >60)
GFR calc non Af Amer: >60 mL/min (ref >60)
GFR calc non Af Amer: >60 mL/min (ref >60)
GFR calc non Af Amer: >60 mL/min (ref >60)
Glucose, Bld: 135 mg/dL — ABNORMAL HIGH (ref 70–99)
Glucose, Bld: 135 mg/dL — ABNORMAL HIGH (ref 70–99)
Potassium: 3.1 mEq/L — ABNORMAL LOW (ref 3.5–5.1)
Potassium: 3.4 mEq/L — ABNORMAL LOW (ref 3.5–5.1)
Potassium: 3.8 mEq/L (ref 3.5–5.1)
Sodium: 129 mEq/L — ABNORMAL LOW (ref 135–145)
Sodium: 133 mEq/L — ABNORMAL LOW (ref 135–145)
Sodium: 135 mEq/L (ref 135–145)
Total Bilirubin: 1.4 mg/dL — ABNORMAL HIGH (ref 0.3–1.2)
Total Bilirubin: 1.8 mg/dL — ABNORMAL HIGH (ref 0.3–1.2)
Total Protein: 5.8 g/dL — ABNORMAL LOW (ref 6.0–8.3)
Total Protein: 5.9 g/dL — ABNORMAL LOW (ref 6.0–8.3)
Total Protein: 6.8 g/dL (ref 6.0–8.3)
Total Protein: 8.1 g/dL (ref 6.0–8.3)

## 2011-03-04 LAB — LIPASE, BLOOD
Lipase: 151 U/L — ABNORMAL HIGH (ref 11–59)
Lipase: 180 U/L — ABNORMAL HIGH (ref 11–59)
Lipase: 393 U/L — ABNORMAL HIGH (ref 11–59)
Lipase: 78 U/L — ABNORMAL HIGH (ref 11–59)

## 2011-03-04 LAB — BLOOD GAS, ARTERIAL
Acid-base deficit: 3.3 mmol/L — ABNORMAL HIGH (ref 0.0–2.0)
O2 Content: 4 L/min
O2 Saturation: 91.8 %
Patient temperature: 37
pO2, Arterial: 60.7 mmHg — ABNORMAL LOW (ref 80.0–100.0)

## 2011-03-04 LAB — BASIC METABOLIC PANEL
BUN: 11 mg/dL (ref 6–23)
Calcium: 7.9 mg/dL — ABNORMAL LOW (ref 8.4–10.5)
Calcium: 8.1 mg/dL — ABNORMAL LOW (ref 8.4–10.5)
Creatinine, Ser: 1.04 mg/dL (ref 0.4–1.5)
GFR calc Af Amer: >60 mL/min (ref >60)
GFR calc Af Amer: >60 mL/min (ref >60)
GFR calc non Af Amer: >60 mL/min (ref >60)
GFR calc non Af Amer: >60 mL/min (ref >60)
GFR calc non Af Amer: >60 mL/min (ref >60)
Potassium: 3.4 mEq/L — ABNORMAL LOW (ref 3.5–5.1)
Potassium: 3.9 mEq/L (ref 3.5–5.1)
Sodium: 131 mEq/L — ABNORMAL LOW (ref 135–145)
Sodium: 134 mEq/L — ABNORMAL LOW (ref 135–145)
Sodium: 135 mEq/L (ref 135–145)

## 2011-03-04 LAB — IRON AND TIBC: UIBC: 91 ug/dL

## 2011-03-04 LAB — TSH: TSH: 2.166 u[IU]/mL (ref 0.350–4.500)

## 2011-03-04 LAB — URINALYSIS, ROUTINE W REFLEX MICROSCOPIC
Bilirubin Urine: NEGATIVE
Hgb urine dipstick: NEGATIVE
Specific Gravity, Urine: 1.02 (ref 1.005–1.030)
pH: 5 (ref 5.0–8.0)

## 2011-03-04 LAB — URINE MICROSCOPIC-ADD ON

## 2011-03-04 LAB — URINE CULTURE: Special Requests: NEGATIVE

## 2011-03-04 LAB — FOLATE
Folate: 10.1 ng/mL
Folate: 8.6 ng/mL

## 2011-03-04 LAB — ABO/RH: ABO/RH(D): O POS

## 2011-03-04 LAB — HEPATITIS A ANTIBODY, TOTAL: Hep A Total Ab: NEGATIVE

## 2011-03-04 LAB — VITAMIN B12
Vitamin B-12: 594 pg/mL (ref 211–911)
Vitamin B-12: 709 pg/mL (ref 211–911)

## 2011-03-04 LAB — RETICULOCYTES
RBC.: 2.84 MIL/uL — ABNORMAL LOW (ref 4.22–5.81)
Retic Ct Pct: 3.8 % — ABNORMAL HIGH (ref 0.4–3.1)

## 2011-03-04 LAB — HEPATITIS B SURFACE ANTIGEN: Hepatitis B Surface Ag: NEGATIVE

## 2011-04-06 NOTE — Op Note (Signed)
[name], [name]             ACCOUNT NO.:  [id number]   MEDICAL RECORD NO.:  [id number]         PATIENT TYPE:  PINP   LOCATION:  A305                          FACILITY:  [facility]   PHYSICIAN:  Kassie Mends, M.D.      DATE OF BIRTH:  [birth year]   DATE OF PROCEDURE:  02/28/2009  DATE OF DISCHARGE:  03/05/2009                                PROCEDURE NOTE   REFERRING PHYSICIAN:  Dorris Singh, DO   PROCEDURE:  Esophagogastroduodenoscopy.   INDICATION FOR EXAMINATION:  Mr. Denomme is a 50 year old male who  presented with acute pancreatitis.  He has melena.  He also has a  transfusion-dependent anemia.   FINDINGS:  1. Normal esophagus without evidence of Barrett's, mass, erosion,      ulceration, or stricture.  2. Normal stomach.  No old blood or fresh blood seen in the stomach.  3. Mild patchy erythema in the duodenal bulb without erosion or      ulceration.  No old blood or fresh blood seen in the second portion      of the duodenum.  Second portion of the duodenum was normal.   DIAGNOSIS:  1. No source for melena identified.  2. Small MW tear 2o to scope trauma   RECOMMENDATIONS:  1. Colonoscopy on March 01, 2009.  2. Clear liquid diet.  3. Continue daily PPI.   PROCEDURE TECHNIQUE:  Physical exam was performed.  Informed consent was  obtained from the patient after explaining the benefits, risks, and  alternatives to the procedure.  The patient was connected to the monitor  and placed in the left lateral position.  Continuous oxygen was provided  by nasal cannula.  IV medicine was administered through an indwelling  cannula.  After administration of sedation, the patient's esophagus was  intubated and the scope was advanced under direct visualization.  The  scope was removed slowly by carefully examining the  color, texture, anatomy, and integrity on the way out.  Upon withdrawal  of the scope, a superficial mucosal tear was seen in the upper  esophagus.  No active  bleeding was noted.  The patient was recovered in  endoscopy and discharged to the floor in satisfactory condition.      Kassie Mends, M.D.  Electronically Signed     SM/MEDQ  D:  03/06/2009  T:  03/06/2009  Job:  [id number]

## 2011-04-06 NOTE — H&P (Signed)
NAMEDEMONDRE, [name]             ACCOUNT NO.:  [id number]   MEDICAL RECORD NO.:  [id number]          PATIENT TYPE:  INP   LOCATION:  IC05                          FACILITY:  [facility]   PHYSICIAN:  Skeet Latch, DO    DATE OF BIRTH:  Oct 30, 1961   DATE OF ADMISSION:  02/15/2009  DATE OF DISCHARGE:  LH                              HISTORY & PHYSICAL   PRIMARY CARE PHYSICIAN:  Dr. Regino Schultze.   CHIEF COMPLAINT:  Abdominal pain.   HISTORY OF PRESENT ILLNESS:  This is a 50 year old African American male  who presented complaining of abdominal pain of 2 days duration.  The  patient states that the pain initially started 2 months prior.  At that  time, the patient states that it was associated with a bowel movement.  The patient had a bowel movement at that time and the pain resolved.  Now, the patient states that the pain restarted a few days ago and he  also began to have some nausea and vomiting.  The patient states that is  got worse after he had hot wings.  The patient presented to the  emergency room.  He was treated with pain medication, as well as  antiemetics and sent home.  The patient returned to the emergency room  stating that the pain was worse.  The patient states that the pain is  epigastric in nature with some radiation to his left lower quadrant.  The patient states that the pain is 10/10 at its worst.   PAST MEDICAL HISTORY:  Hypertension.   PAST SURGICAL HISTORY:  Left knee surgery.   SOCIAL HISTORY:  No history of smoking.  The patient does admit to  drinking a 6-12 pack of beer daily for over 20 years.  Denies any drug  use.   FAMILY HISTORY:  Noncontributory.   ALLERGIES:  NO KNOWN DRUG ALLERGIES.   HOME MEDICATIONS:  1. Phenergan 25 mg p.r.n.  2. Oxycodone/acetaminophen 7.5/500 mg p.r.n.   REVIEW OF SYSTEMS:  CONSTITUTIONAL:  Positive for decreased appetite.  No fever or chills.  No weight gain or weight loss.  HEENT:  Unremarkable.  RESPIRATORY:   Unremarkable.  GASTROINTESTINAL:  Positive  for nausea, vomiting and abdominal pain.  GENITOURINARY:  Unremarkable.  SKIN:  Unremarkable.  NEUROLOGIC:  Unremarkable.  All other systems  unremarkable.   PHYSICAL EXAMINATION:  VITAL SIGNS:  Temperature is 98.7, respiratory  rate 24, heart rate 112, blood pressure 125/69.  He is saturating 91% on  room air.  CONSTITUTIONAL:  He is well-nourished, well-hydrated, well-developed in  no acute distress.  HEENT:  Head is atraumatic, normocephalic.  Eyes are PERRLA.  EOMI.  NECK:  Soft, supple, nontender, nondistended.  CARDIOVASCULAR:  He is tachycardic.  Regular rate and rhythm.  No  murmurs, rubs or gallops.  LUNGS:  Clear to auscultation.  No rubs, rhonchi or wheezes.  ABDOMEN:  Soft.  Positive epigastric tenderness, as well as tenderness  in his left lower quadrant.  No rigidity.  Slight guarding.  Minimal  bowel sounds.  EXTREMITIES:  No clubbing, cyanosis or edema.  NEUROLOGIC:  Cranial  nerves II through XII are grossly intact.  The  patient is alert and oriented x3.   LABORATORY DATA:  Lipase is 584, sodium 135, potassium 3.4, chloride 97,  CO2 of 24, glucose 135, BUN 7, creatinine 1.05, total bilirubin 1.4,  alkaline phosphatase 64, AST 61, ALT 18, total protein 8.1, albumin 3.3.  White count is 14,600, hemoglobin 12.8, hematocrit 36.9, platelet count  247,000.  CT of his abdomen and pelvis showed:  1) Marked pancreatitis.  2) Duodenal wall thickening which is likely secondary to duodenitis.  3)  Gallbladder distention without significant evidence of acute  cholecystitis.  4) Question enhancement of the common duct.  __________  correlation with bilirubin levels to exclude common duct stone.  5) Mild  hypoenhancement in the pancreatic body, cannot exclude early necrosis.  6) Severe fatty infiltration of the liver.  7) Small amount of ascites  which is likely secondary to pancreatitis.  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  CT of this  pelvis:  1) Showed a small amount of ascites,  likely secondary to an abdominal process.  2) Fluid-containing right  inguinal hernia.  3) Bilateral femoral head avascular necrosis.   ASSESSMENT:  1. Acute pancreatitis.  2. History of ethanol abuse.  3. Leukocytosis.  4. Anemia.  5. Fatty liver.   PLAN:  1. Patient admitted to the service of [facility] to a general medical      bed.  2. For his pancreatitis, the patient will be kept n.p.o. with ice      chips at this time.  The patient will be placed on IV pain agents,      as well as IV antiemetics.  The patient will be IV hydrated also at      this time.  We will get a gastroenterology consult secondary to his      pancreatitis and his multiple findings on his CT scan of his      abdomen and pelvis, which includes duodenal wall thickening,      gallbladder distention, fatty liver and probable esophageal      dysmotility.  3. For his history of ETOH abuse, the patient will be placed on Ativan      withdrawal protocol at this time.  4. Leukocytosis, probably secondary to his abdominal processes.  We      will place the patient empirically on antibiotics at this time.  5. For his anemia, unsure if chronic in nature, probably secondary to      his chronic alcohol abuse.  We will continue to monitor closely.  6. The patient does have a hypertension.  His blood pressure seems to      be stable.  We will continue to monitor.      Of note, the patient did also show scant bilateral femoral head      avascular necrosis.  This probably will need to be evaluated in the      near future.  7. The patient will be on DVT, as well as GI prophylaxis.      Skeet Latch, DO  Electronically Signed     SM/MEDQ  D:  02/15/2009  T:  02/15/2009  Job:  [id number]   cc:   Kirk Ruths, M.D.  Fax: [phone]

## 2011-04-06 NOTE — Consult Note (Signed)
NAMEDRAYLEN, LOBUE             ACCOUNT NO.:  [id number]   MEDICAL RECORD NO.:  [id number]          PATIENT TYPE:  INP   LOCATION:  A305                          FACILITY:  [facility]   PHYSICIAN:  Barbaraann Barthel, M.D. DATE OF BIRTH:  Nov 05, 1961   DATE OF CONSULTATION:  DATE OF DISCHARGE:                                 CONSULTATION   Note, surgery was asked to see this 50 year old black male for his left  inguinal hernia.  In essence, this 50 year old African American the  patient is admitted for pancreatitis.  He is currently on the medical  service as being seen by the GI people regarding this.  He continues to  have abdominal pain.  He is noted on CT scan to have some peripancreatic  inflammation and as well as bilateral pleural effusions.  Surgery was  asked to see him regarding his left inguinal hernia.   In essence, he has a small left inguinal hernia.  This may, in fact, an  inguinal groin node and at any rate, no surgery would be contemplated on  this patient until his medical status has improved.  I have communicated  this to the [facility] group.  Ideally his ascites and this would need to  be under control and obviously all symptoms of pancreatitis would need  to subside before any surgery would be contemplated.   If his pancreatitis worsens, please consult the General surgeon on call  that would be Dr. Lovell Sheehan and not myself.  I would be happy to take care  of this hernias on an elective basis at a later time, however.  At any  rate, if his pancreatitis did exacerbate, this patient should be treated  in a tertiary environment and not here to [facility].      Barbaraann Barthel, M.D.  Electronically Signed     WB/MEDQ  D:  02/27/2009  T:  02/27/2009  Job:  [id number]   cc:   [facility]

## 2011-04-06 NOTE — Group Therapy Note (Signed)
NAMESHANDELL, JALLOW             ACCOUNT NO.:  [id number]   MEDICAL RECORD NO.:  [id number]          PATIENT TYPE:  INP   LOCATION:  A305                          FACILITY:  [facility]   PHYSICIAN:  Osvaldo Shipper, MD     DATE OF BIRTH:  1961/02/09   DATE OF PROCEDURE:  02/26/2009  DATE OF DISCHARGE:                                 PROGRESS NOTE   This is patient day 12 in the hospital.   SUBJECTIVE:  The patient is feeling better.  He denies any complaints at  this time.  Breathing is better.  His abdominal pain is improved.  Denies any nausea and vomiting.  Still having 3-4 BMs on a daily basis,  however the stool is more formed.  He denies any blood in the stool,  says the stool is dark brown in color.   OBJECTIVE:  VITAL SIGNS:  T-max 100.9 overnight, heart rate 104,  respiratory rate 20, blood pressure 124/66.  Saturations 97% on 2  liters, room air saturations are not available at this time.  LUNGS:  Decreased air entry at the bases but clear to auscultation  otherwise.  CARDIOVASCULAR:  S1 and S2 normal and regular, no S3, S4; no rubs, no  bruits.  ABDOMEN:  Soft, slightly tender in the epigastric area, distended.  No  rebound, rigidity or guarding.  Bowel sounds present, no masses or  organomegaly is appreciated.  EXTREMITIES:  Continue to show edema.  NEUROLOGIC:  He is alert and oriented x3, no focal neurological deficits  are present.   White count continues to improve, 13,500 today compared to 15,000  yesterday.  Hemoglobin has dropped again to 8.4, MCV is 97, platelet  count is increasing 729.  Potassium is 3.3, glucose 178.  Fecal occult  blood testing negative, I think x3.  C. difficile negative x2.  Chest x-  ray was done yesterday which showed bibasilar atelectasis and pleural  effusions.  I think they have improved.  Infiltrate is still noticed in  the right lung.   ASSESSMENT AND PLAN:  1. Fever, multifactorial, including pancreatitis, pneumonia and drug  fever.  Most of his antibiotics have been discontinued.  Today we      will discontinue doxycycline.  He will then remain only on Flagyl      orally for presumed C. difficile.  He has completed course of      vancomycin, imipenem and azithromycin.  I am hoping the fevers will      resolve.  The white count is coming down.  We also have done venous      Dopplers which have not shown any DVT.  He does not have any joint      swellings.  The patient actually clinically is looking better.  2. Acute pancreatitis, related to alcohol use.  GI is managing.  He      has had so far 3 CT scans during this admission, the last one was      done about 5 days ago which actually showed worsening changes of      acute pancreatitis.  I think  we may have to again repeat another CT      considering his worsening platelet count to look for pseudocysts      but I would defer to GI.  3. Anemia.  Unclear why his hemoglobin keeps dropping.  His occult      blood testing has been negative x3.  We will transfuse one unit of      blood.  GI wants to scope him soon.  4. Aspiration versus hospital-acquired pneumonia.  He has completed      treatment for this with antibiotics and his lungs are actually      sounding better.  Will check room air saturations on him today.  5. History of alcoholism.  He has been counseled.  6. Hypoalbuminemia. He is on nutritional supplementation.  7. He is a full code.   The patient continues to show improvement.  We are awaiting fever to  subside completely and GI doing endoscopy soon. His hemoglobin also  needs to stabilize.      Osvaldo Shipper, MD  Electronically Signed     GK/MEDQ  D:  02/26/2009  T:  02/26/2009  Job:  [id number]

## 2011-04-06 NOTE — Group Therapy Note (Signed)
NAMEFREDDERICK, [name]             ACCOUNT NO.:  [id number]   MEDICAL RECORD NO.:  [id number]          PATIENT TYPE:  INP   LOCATION:  A305                          FACILITY:  [facility]   PHYSICIAN:  Osvaldo Shipper, MD     DATE OF BIRTH:  [birth year]   DATE OF PROCEDURE:  02/24/2009  DATE OF DISCHARGE:                                 PROGRESS NOTE   SUBJECTIVE:  This is actually day 10 of his admission. Subjectively, he  is feeling better.  His pain in the abdomen is improved. He says his  stool is also getting more formed.  He is feeling less short of breath.  He is able to ambulate within his room. Overall he is feeling much  better.   OBJECTIVE:  VITAL SIGNS:  He still continues to spike temperatures. This  morning it was 99.2. However, at 4:00 this morning it was 102.0, heart  rate 105, respiratory rate 20, blood pressure 128/67, saturation 92% on  3 L.  GENERAL APPEARANCE:  This is a well-developed, well-nourished black male  in no distress.  HEENT:  There is no pallor, no icterus.  NECK:  Soft and supple.  LUNGS:  Reveal improving air entry bilaterally.  No wheezing is present.  CARDIOVASCULAR: S1, S2 is normal regular.  No murmurs appreciated.  No  S3 or S4. No rubs, no bruits.  ABDOMEN:  Soft, distended.  Bowel sounds  are present.  Slightly tender in the epigastric area.  No rebound,  rigidity or guarding.  No masses are felt.  EXTREMITIES:  Show 1+ pitting edema bilaterally.   LABORATORY DATA:  White count this morning is 16,200. Hemoglobin has  dropped again to 8.1. MCV is 98.7, platelet count of 617, platelet count  is also increasing steadily over the past 3-4 days.  Sodium is 133,  potassium 3.6, glucose 129, lipase yesterday was 31.   ASSESSMENT/PLAN:  1. Fever, multifactorial.  He will finish today 7 days of vancomycin      and 9 days of imipenem.  Based on ID recommendations as this fever      could be drug fever we will go ahead and discontinue both of these  antibiotics after today.  Today will be day 6 of azithromycin and      day 5 of doxycycline, and we will continue both of them for now.      He was also started on Flagyl yesterday by Dr. Cira Servant for presumed      C.  diff which we will continue for now.  He has finished a course      of Tamiflu.  Etiology for the fever is multifactorial, pancreatitis      pneumonia or drug fever.  2. Acute pancreatitis improving, clinically he is improved.  His      concerning thing is platelet count is increasing.  We will need to      monitor this very closely.  GI is managing this issue.  3. Anemia.  Hemoglobin has dropped some more. We will transfuse him 1      more unit.  The EGD  is planned before discharge by GI.  4. Aspiration versus hospital-acquired pneumonia.  He is on broad-      spectrum antibiotics as discussed above.  Dr. Juanetta Gosling is following      this patient.  5. History of alcoholism, counseled  6. Hypoalbuminemia probably the reason for his edema.  7. He is a full code.   So overall he is improving.  Hopefully his fevers will subside soon and  he should be able to go home.  He needs an EGD before he is discharged.      Osvaldo Shipper, MD  Electronically Signed     GK/MEDQ  D:  02/24/2009  T:  02/24/2009  Job:  [id number]

## 2011-04-06 NOTE — Group Therapy Note (Signed)
[name], [name]             ACCOUNT NO.:  [id number]   MEDICAL RECORD NO.:  [id number]          PATIENT TYPE:  INP   LOCATION:  A305                          FACILITY:  [facility]   PHYSICIAN:  Margaretmary Dys, M.D.DATE OF BIRTH:  Jan 23, 1961   DATE OF PROCEDURE:  02/27/2009  DATE OF DISCHARGE:                                 PROGRESS NOTE   SUBJECTIVE:  The patient is doing a little bit better today.  He has  very minimal abdominal pain.  He has has no nausea or vomiting.  He has  had occasional diarrhea, but better formed now.  No blood in his stools  or black color.   OBJECTIVE:  GENERAL APPEARANCE:  Conscious, alert, comfortable and not  in acute distress.  The patient was chronically ill-looking.  VITAL SIGNS:  Blood pressure is 119/70 with a pulse of 109, respirations  are 18, temperature is 98.9 degrees Fahrenheit, and  oxygen saturation  is 93% on room air.  HEENT EXAMINATION:  Normocephalic and atraumatic head.  Oral mucosa is  dry.  No exudates are noted.  NECK:  The neck is supple.  No JVD or lymphadenopathy.  LUNGS:  He has good entry bilaterally.  HEART:  S1 and S2, regular.  No S3, S4, gallops or rubs.  ABDOMEN:  The abdomen is soft, and is slightly tender and distended.  No  rebound.  No rigidity.  No guarding.  This patient has a left inguinal  hernia.  EXTREMITIES:  The extremities have bilateral pitting pedal edema.  No  calf induration or tenderness.  NEUROLOGIC:  CNS exam:  The patient is awake, alert and following  commands.   LABORATORY DATA:  White blood cell count is down to 13.7, hemoglobin  9.2, hematocrit 26.4 and platelet count was 745,000 with 82%  neutrophils.  Sodium was 137, potassium was 3.5, chloride of 106, CO2  was 27, glucose was 128, and BUN of 5.  Calcium was 8.3.   ASSESSMENT AND PLAN:  1. Fever likely related to pancreatitis, pneumonia and possibly a drug      fever.  The patient's antibiotics have been discontinued.  The  patient is currently on Flagyl..  The patient will complete a total      of 14 days of the Flagyl.  His white blood cell count has also      significantly improved.  We will continue to monitor him closely.  2. Acute pancreatitis, alcohol related.  The patient is being followed      by Gastroenterology.  The patient is scheduled for an endoscopy      tomorrow, but I think that is more related to his anemia.  Overall      the patient remains stable from that standpoint.  The patient is      not showing any evidence of a pseudocyst at this time.  3. Anemia.  The patient continues to have a drop in his hemoglobin and      hematocrit.  The patient is scheduled for an endoscopy tomorrow,      after all the patient's hemoglobin and hematocrit have  been stable      over the last 3 days.  The patient was seen by Dr. Cira Servant who has      recommended some studies.  4. Left inguinal hernia.  This is an incidental finding.  The patient      was seen by Dr. Malvin Johns who has recommended follow up as an      outpatient.  5. Aspiration versus hospital-acquired pneumonia.  The patient has      currently completed his antibiotics.  Hemoglobin and hematocrit are      stable.  The patient is not septic at this time.  6. History of alcoholism.  Extensive counseling has been provided to      the patient during the course of this hospitalization.   DISPOSITION:  The patient is having endoscopy tomorrow.  We will  evaluate after his endoscopy.      Margaretmary Dys, M.D.  Electronically Signed     AM/MEDQ  D:  02/27/2009  T:  02/28/2009  Job:  [id number]

## 2011-04-06 NOTE — Group Therapy Note (Signed)
[name], [name]             ACCOUNT NO.:  [id number]   MEDICAL RECORD NO.:  [id number]          PATIENT TYPE:  INP   LOCATION:  A305                          FACILITY:  [facility]   PHYSICIAN:  [name] Shipper, MD     DATE OF BIRTH:  27-Mar-1961   DATE OF PROCEDURE:  DATE OF DISCHARGE:                                 PROGRESS NOTE   This is day 11 of his admission.   SUBJECTIVE:  The patient is feeling better.  He is experiencing only  minimal abdominal pain now.  His diarrhea is also improving.  Breathing  is better.  Denies any new symptoms.   OBJECTIVE:  VITAL SIGNS:  Last recorded temperature here is 100.3, heart  rate 102, respiratory rate 20, blood pressure 135/79, saturation 97% on  2 liters.  Room air sat is pending.  GENERAL:  A well-built African American male in no distress.  HEENT:  There is no pallor, no icterus.  NECK:  Soft and supple.  LUNGS:  Reveal improving air entry bilaterally.  No wheezing is present.  CARDIOVASCULAR:  S1 and S2, normal, regular.  No murmur is appreciated.  No S3 or S4.  No rubs.  No bruits.  ABDOMEN:  Soft, distended.  Bowel sounds are present.  Slight tenderness  in the epigastric area.  No masses are present.  No rebound or rigidity  is present.  EXTREMITIES:  Show 1+ pitting edema bilaterally.   LABORATORY DATA:  His white count is improving, it is 15,000 today,  hemoglobin is 9.0 improved from 8.1 yesterday status post 1 unit of  blood, platelet count is also increasing at 703, electrolytes are  normal, glucose 147, lipase is 31, C. diff negative x2.  Lyme titer is  negative.  Occult blood negative.   ASSESSMENT/PLAN:  1. Fever, likely multifactorial which includes pancreatitis,      pneumonia, drug fever.  We discontinued vancomycin and imipenem      yesterday.  We discontinued azithromycin yesterday as he would have      completed 7 days.  Today is day 6 of doxycycline for presumed      Lyme's which we will discontinue tomorrow.   He was started on      Flagyl for presumed clostridium difficile , however, clostridium      difficile  is negative x2.  I think Flagyl may also be discontinued      safely in the next day or 2, but I would defer to Dr. Cira Servant.  He      has finished a course of Tamiflu as well as we await resolution of      fever.  2. Acute pancreatitis related to alcohol intake.  This is also      improving.  GI is managing for this issue.  3. Mild hyperglycemia, possibly related to pancreatitis.  We will      check his HbA1c.  4. Aspiration versus hospital-acquired pneumonia.  He was on broad-      spectrum antibiotics which were discontinued yesterday.      Azithromycin will be discontinued today.  His  O2 requirements seem      to be coming down.  We will check his room air sats today.  Dr.      Juanetta Gosling is also following this patient.  5. History of alcoholism.  He has been counseled extensively.  6. Hypoalbuminemia.  He is getting nutritional supplementation.  7. He is full code.  8. Anemia.  He was transfused 1 unit.  His FOBT is negative.  GI plans      to do endoscopy prior to discharge.   Overall, the patient seems to be improving.  The only thing remaining  basically is fever is to resolve which I am hoping will resolve in the  next 12-24 hours.  Then he will need to have an endoscopy.  He then  should be able to go home.  He may or may not require home O2.      [name] Shipper, MD  Electronically Signed     GK/MEDQ  D:  02/25/2009  T:  02/25/2009  Job:  [id number]

## 2011-04-06 NOTE — Group Therapy Note (Signed)
NAME:  MIRIAM, KESTLER             ACCOUNT NO.:  [id number]   MEDICAL RECORD NO.:  [id number]          PATIENT TYPE:  INP   LOCATION:  A305                          FACILITY:  [facility]   PHYSICIAN:  Edward L. Juanetta Gosling, M.D.DATE OF BIRTH:  1961-08-14   DATE OF PROCEDURE:  DATE OF DISCHARGE:                                 PROGRESS NOTE   Patient of the [facility] Hospitalist Team.  Mr. Elsen is overall I  think about the same.  He was evaluated by Dr. Malvin Johns and it is not  totally clear if his problem is a hernia or perhaps a lymph node, but at  any rate Dr. Malvin Johns does not feel that this needs surgery now.  Mr.  Burmester says that he is still having some difficulty with his abdomen,  but he is breathing well.  He is not coughing.  He still has some  twinges of abdominal pain.   His exam shows that he looks pretty comfortable.  His chest is clearer  than before.  His temperature 98.6, pulse 98, respirations 22, blood  pressure 132/79, and O2 sats 93% on room air.  His heart is regular.   ASSESSMENT:  Overall I think he is better.   Plan is to continue with his treatments and medications and follow.      Edward L. Juanetta Gosling, M.D.  Electronically Signed     ELH/MEDQ  D:  02/28/2009  T:  02/28/2009  Job:  [id number]

## 2011-04-06 NOTE — Consult Note (Signed)
[name], [name]             ACCOUNT NO.:  [id number]   MEDICAL RECORD NO.:  [id number]          PATIENT TYPE:  INP   LOCATION:  A305                          FACILITY:  [facility]   PHYSICIAN:  Dennie Maizes, M.D.   DATE OF BIRTH:  1961/11/11   DATE OF CONSULTATION:  DATE OF DISCHARGE:                                 CONSULTATION   REFERRING PHYSICIAN:  Skeet Latch, hospitalist.   REASON FOR CONSULTATION:  Scrotal swelling.   This 50 year old male who has alcohol abuse in the past.  He had been  admitted to the hospital with worsening abdominal pain of few days  duration.  He started noticing swelling of the scrotum for the past few  days.  Actually, the swelling is getting better at present.  He did not  have any voiding difficulty or urinary symptoms.  No history of injury  to the scrotum.  He has urinary frequency times 3-4, and nocturia x1.  There is no past history of dysuria, hematuria, urolithiasis or urinary  tract infection.  Has not had any prostate problems in the past.   PAST MEDICAL HISTORY:  History of alcohol abuse, status post left knee  surgery.   MEDICATIONS:  1. Phenergan 25 mg p.r.n.  2. Oxycodone and acetaminophen 7.5/500 mg p.o. p.r.n.   ALLERGIES:  None.   Evaluation in the hospital revealed acute pancreatitis with ascites.  The patient also has bilateral pleural effusions as well as peripheral  edema.   PHYSICAL EXAMINATION:  ABDOMEN:  Soft.  No palpable flank, mass, or CVA  tenderness.  GENITOURINARY:  There is edema of the penile skin.  There is generalized  edema of the scrotal skin.  Testes are normal.  The epididymis are  normal.  There is no tenderness or induration of the testicles or  epididymis.  RECTAL:  Not done.   LABS:  CBC:  WBC 16.1, hemoglobin 9.4, hematocrit 27.  BUN 6, creatinine  0.98.  Urine culture sensitivity, blood culture sensitivity has been  negative.  Urinalysis is clear.  Nitrite positive, rbc's 3-6 high-power  field, bacteria rare.   The patient has been evaluated with CT scan of the abdomen and pelvis.  No abnormality of the urinary tract has been noted.  The patient has  arthritis, bilateral pleural effusion.  There is no evidence of a  urinary calculi or obstruction.   IMPRESSION:  Scrotal edema (part of generalized edema).  The patient has  bilateral pleural effusion, ascites, as well as peripheral edema.   SUGGESTIONS:  1. Diuretics.  2. Ambulation.  3. Scrotal elevation.  I feel this scrotal edema will resolve with      these measures.   Thanks for this consult.      Dennie Maizes, M.D.  Electronically Signed     SK/MEDQ  D:  02/20/2009  T:  02/21/2009  Job:  [id number]

## 2011-04-06 NOTE — Group Therapy Note (Signed)
NAME:  [name], [name]             ACCOUNT NO.:  [id number]   MEDICAL RECORD NO.:  [id number]          PATIENT TYPE:  INP   LOCATION:  A305                          FACILITY:  [facility]   PHYSICIAN:  Edward L. Juanetta Gosling, M.D.DATE OF BIRTH:  12-23-1960   DATE OF PROCEDURE:  DATE OF DISCHARGE:                                 PROGRESS NOTE   Mr. Albro states he is okay.  He is being prepped for his colonoscopy  today.  He is off all of his antibiotics except for Flagyl and he spiked  a temp to a 102 last night, it was 100.8 this morning, pulse is 93,  respirations 16, blood pressure 126/75, and O2 sats 90% on room air.  We  really do not have a source of fever except for his abdomen.  He has the  abnormalities on chest x-ray, but he is not having any significant  symptoms.  Concern is what is the source of his fever.  He still has  significant abdominal problems.  He is going to have a colonoscopy  today.      Edward L. Juanetta Gosling, M.D.  Electronically Signed     ELH/MEDQ  D:  03/01/2009  T:  03/02/2009  Job:  [id number]

## 2011-04-06 NOTE — Op Note (Signed)
NAME:  [name], [name]             ACCOUNT NO.:  [id number]   MEDICAL RECORD NO.:  [id number]          PATIENT TYPE:  INP   LOCATION:  A305                          FACILITY:  [facility]   PHYSICIAN:  R. Roetta Sessions, M.D. DATE OF BIRTH:  Oct 01, 1961   DATE OF PROCEDURE:  03/04/2009  DATE OF DISCHARGE:                               OPERATIVE REPORT   PROCEDURE PERFORMED:  Given small bowel capsule study.   INDICATIONS FOR PROCEDURE:  The 50 year old gentleman admitted to  hospital with alcohol induced pancreatitis, suffered an episode of GI  bleeding characterized as melena.  Significant drop in hemoglobin  requiring transfusion.  EGD by Dr. Cira Servant revealed only duodenitis.  Colonoscopy by me demonstrated a rectosigmoid polyp which was resected  with the snare.  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  The patient also had  pan colonic diverticula and a normal terminal ileum.  Because of a  significant drop in his hemoglobin and the relatively negative findings  of EGD and colonoscopy, he is undergoing given capsule study.  His  hospital course has also been complicated by pneumonia.  Capsule results  as follows:  First gastric image acquired 5 minutes 17 seconds, first  duodenal image 9 minutes 55 seconds, first ileocecal valve image 3 hours  52 minutes, the first cecal image 4 hours 8 minutes.  The patient was  noted have prominent vascular pattern throughout the small bowel mucosa.  There was a single small area of eroded appearing mucosa that was  nonbleeding at 3 hours 49 minutes (most likely corresponding to the more  distal small bowel i.e. ileum).   There was no evidence of ulcer or tumor in his small bowel.   A prominent small bowel mucosal vascular pattern, single erosion in the  ileum, no evidence of active bleeding ulcer or neoplasm.   RECOMMENDATIONS:  1. Would avoid alcohol.  2. Avoid nonsteroidal agents.  3. Arrange follow-up with Dr. Cira Servant in the coming weeks to follow up   on hemoglobin, etc.      R. Roetta Sessions, M.D.  Electronically Signed     RMR/MEDQ  D:  03/04/2009  T:  03/04/2009  Job:  [id number]   cc:   hospitalist team   Kassie Mends, M.D.  [address]  [place] , [place] [address]

## 2011-04-06 NOTE — Group Therapy Note (Signed)
NAME:  HILMAR, MOLDOVAN             ACCOUNT NO.:  [id number]   MEDICAL RECORD NO.:  [id number]          PATIENT TYPE:  INP   LOCATION:  A305                          FACILITY:  [facility]   PHYSICIAN:  Edward L. Juanetta Gosling, M.D.DATE OF BIRTH:  Mar 12, 1961   DATE OF PROCEDURE:  DATE OF DISCHARGE:                                 PROGRESS NOTE   Mr. Jenifer is better.  He has no new complaints.   PHYSICAL EXAMINATION:  GENERAL:  Today, he is awake and alert.  VITAL SIGNS:  Temperature is 99.7, pulse 91, respirations 20, blood  pressure 139/70, and O2 sats 94% on room air.  CHEST:  Clear.  HEART:  Regular.  ABDOMEN:  Soft.   ASSESSMENT:  He is better.   PLAN:  Continue his treatments and follow.      Edward L. Juanetta Gosling, M.D.  Electronically Signed     ELH/MEDQ  D:  03/02/2009  T:  03/03/2009  Job:  [id number]

## 2011-04-06 NOTE — Group Therapy Note (Signed)
[name], [name]             ACCOUNT NO.:  [id number]   MEDICAL RECORD NO.:  [id number]          PATIENT TYPE:  INP   LOCATION:  A305                          FACILITY:  [facility]   PHYSICIAN:  Dorris Singh, DO    DATE OF BIRTH:  [birth year]   DATE OF PROCEDURE:  DATE OF DISCHARGE:                                 PROGRESS NOTE   The patient was seen today, he states he feels little bit better and GI  would like to do a capsular study on him on Monday.  The patient  understands that he will continue and followup appropriately with [place].   VITAL SIGNS:  Temperature 98.6, pulse 87, respirations 18, blood  pressure 118/75.  GENERAL:  The patient is well developed, well nourished, in no acute  distress.  HEART:  Regular rate and rhythm.  LUNGS:  Clear to auscultation bilaterally.  ABDOMEN:  Soft, nontender, nondistended.   LABORATORY DATA:  White count 11.5, hemoglobin 9.5, hematocrit 27.1, and  the platelet count is 744.  Sodium is 136, potassium 4.4, chloride 103,  CO2 of 28, glucose 127, BUN 3, and creatinine 0.96.   ASSESSMENT AND PLAN:  1. Fever which has resolved which is secondary to pancreatitis and      pneumonia.  The patient is currently on Flagyl and his white count      is improving.  2. Acute pancreatitis which is alcohol related.  3. The patient is scheduled for capsular study on Monday.  He had a      colonoscopy in which they found a polyp and pancolonic diverticuli.  4. Anemia.  Discontinue be an issue, however, it seems like it is      stable, so we will continue to monitor this.  5. Left inguinal hernia through incidental finding, he was seen by Dr.      Malvin [name] who recommended followup as outpatient.  We will counsel      the patient on alcoholism.  We will continue to monitor him and      follow and change therapy as necessary.      Dorris Singh, DO  Electronically Signed     CB/MEDQ  D:  03/01/2009  T:  03/02/2009  Job:  [id number]

## 2011-04-06 NOTE — Group Therapy Note (Signed)
NAME:  [name], [name]             ACCOUNT NO.:  [id number]   MEDICAL RECORD NO.:  [id number]          PATIENT TYPE:  INP   LOCATION:  A305                          FACILITY:  [facility]   PHYSICIAN:  Edward L. Juanetta Gosling, M.D.DATE OF BIRTH:  04-11-1961   DATE OF PROCEDURE:  DATE OF DISCHARGE:                                 PROGRESS NOTE   Patient of the [facility] Hospitalist Team.   SUBJECTIVE:  [name] is about the same.  He continues to have some  fever.  His abdomen is still somewhat distended.  Chest x-ray yesterday  is read, is showing more extensive infiltrate, but I think the overall  appearance of the chest x-ray is better.  I think he has mostly still  pancreatic problems.  He does have an infiltrate on chest x-ray, but is  essentially asymptomatic from a pulmonary point of view.  I am going to  plan to continue his treatments and follow.      Edward L. Juanetta Gosling, M.D.  Electronically Signed     ELH/MEDQ  D:  02/26/2009  T:  02/26/2009  Job:  [id number]

## 2011-04-06 NOTE — Group Therapy Note (Signed)
NAMEJUANLUIS, [name]             ACCOUNT NO.:  [id number]   MEDICAL RECORD NO.:  [id number]          PATIENT TYPE:  INP   LOCATION:  A305                          FACILITY:  [facility]   PHYSICIAN:  Osvaldo Shipper, MD     DATE OF BIRTH:  Apr 24, 1961   DATE OF PROCEDURE:  02/17/2009  DATE OF DISCHARGE:                                 PROGRESS NOTE   SUBJECTIVE:  The patient feels that his abdominal pain is better.  He  had a bowel movement yesterday, which was not described as bloody, which  made him feel better.  He, however, continues to complain of some  shortness of breath.  Denies any chest pain.  No nausea or vomiting.   OBJECTIVE:  VITAL SIGNS:  Temperature 99.1, heart rate 118, blood  pressure 121/75, respiratory rate 20, saturation 92% on 4 L.  GENERAL:  This is a well-developed, well-nourished black male in no  distress.  HEENT:  There is no pallor, no icterus.  Oral mucous membrane is moist.  No oral lesions are noted.  NECK:  Soft and supple.  No thyromegaly is appreciated.  LUNGS:  Decreased air entry at the bases but mostly clear to  auscultation.  No wheezing is present.  No crackles are heard.  CARDIAC:  S1, S2 is normal, regular, actually slightly tachycardic.  No  murmurs appreciated.  ABDOMEN:  Soft, nontender, nondistended.  Bowel sounds are present.  No  masses or organomegaly are appreciated.  EXTREMITIES:  Show no edema.  NEUROLOGIC:  He is alert, oriented x3.  No focal neurological deficits  are present.   Lab data:  His white count is 13,100, hemoglobin is 10.1, this is a drop  from 13.0 yesterday.  Platelet count is 218.  Sodium is 129, potassium  is 3.8, chloride is 99, bicarb is 21, glucose 168, BUN is 30, creatinine  is 1.41, improvement from yesterday.  Bilirubin is 1.1.   1. Acute pancreatitis related to alcoholism.  He is slowly getting      better, though he is still quite sick at this time.  The patient      appears to have some degree of  third-spacing of his fluids.  We      will continue to monitor on telemetry.  At this time he can be      monitored on the floor and does not need to go into the ICU.  2. Fever.  Definitely has resolved, though he is still running low-      grade fevers.  I will send another set of blood cultures.  Continue      with imipenem for now.  Urine cultures are pending.  3. Prerenal azotemia, improving.  Continue with the IV fluids.  4. Hyperbilirubinemia, improving as well.  5. History of alcoholism.  He is on the Ativan protocol.  He is on      thiamine.  6. Macrocytosis.  His B12, folate and TSH levels were all normal.  It      is likely related to alcoholism.  7. Hyponatremia.  Stable.  He is on  normal saline.  Hopefully, this      should improve in the next few days.  8. Anemia.  Drop in the H and H likely dilutional.  No overt bleeding      was reported.  We will check stool for occult blood.  Anemia panel      will be sent as well.   So overall the patient appears to be stable.  He still will require at  least 2-3 days of hospitalization more.  We will continue to monitor him  closely.  GI is assisting with his care at this time.      Osvaldo Shipper, MD  Electronically Signed     GK/MEDQ  D:  02/17/2009  T:  02/17/2009  Job:  [id number]

## 2011-04-06 NOTE — Group Therapy Note (Signed)
[name], [name]             ACCOUNT NO.:  [id number]   MEDICAL RECORD NO.:  [id number]          PATIENT TYPE:  INP   LOCATION:  A305                          FACILITY:  [facility]   PHYSICIAN:  Dorris Singh, DO    DATE OF BIRTH:  03-15-1961   DATE OF PROCEDURE:  DATE OF DISCHARGE:                                 PROGRESS NOTE   The patient is seen today.  States that he feels a little bit better.  He is still having issues with elevated white count and fever today.  Discussed case with Dr. Jena Gauss.  He is getting his capsular study.  If he  continues to do well he can be discharged tomorrow.  He will not need to  be held any longer than an additional 24 hours.   VITALS:  Temperature 99.7, pulse 91, respirations 20, blood pressure  139/70.  GENERAL:  The patient is well-developed, well-nourished in no acute  distress.  HEART:  Regular rate and rhythm.  LUNGS:  Clear to auscultation bilaterally.  ABDOMEN:  Soft, nontender.  EXTREMITIES:  Positive pulses.  No edema, ecchymosis or cyanosis.   His white count today is 10.2, hemoglobin 9.4, hematocrit 26.9, platelet  count of 676.  Sodium is 132, potassium 3.9, chloride 98, glucose 118,  CO2 is 28 and creatinine is 0.82 and BUN is 2.  Liver enzymes are within  normal limits.   ASSESSMENT/PLAN:  1. Pancreatitis.  This seems to have has resolved slowly.  2. Pneumonia.  The patient was treated with antibiotics.  Dr. Juanetta Gosling      followed him and then signed off.  Felt that his problems were not      due to pulmonary issues.  His pancreatitis seems to be resolving.      The patient has had an esophagogastroduodenoscopy and a      colonoscopy.  The reports are above.  Also he is getting currently      a capsular study.  3. Pyrexia.  He has had fluctuating temperatures.  There is some      concern due to his past medical history that maybe an      echocardiogram would be in order.  We will go ahead and order that      for Monday.  If  everything is stable for the patient tomorrow then      he can be discharged at that point in time with proper followup      with Gastroenterology.  They will see him in the office at the      beginning of the week to go over his capsular study.  4. Other issue is anemia.  This has remained stable.  Like I said,      Gastroenterology is following this and hey will follow up.  5. Left inguinal hernia.  Surgery saw him.  This is something he can      elect to have done at a later date.  6. History of alcoholism.  He has been counseled on stopping this      behavior.     Dorris Singh,  DO  Electronically Signed    CB/MEDQ  D:  03/02/2009  T:  03/02/2009  Job:  [id number]

## 2011-04-06 NOTE — Group Therapy Note (Signed)
[name], [name]             ACCOUNT NO.:  [id number]   MEDICAL RECORD NO.:  [id number]          PATIENT TYPE:  INP   LOCATION:  A305                          FACILITY:  [facility]   PHYSICIAN:  Osvaldo Shipper, MD     DATE OF BIRTH:  [birth year]   DATE OF PROCEDURE:  02/21/2009  DATE OF DISCHARGE:                                 PROGRESS NOTE   Subjectively, the patient feels a little better.  He is breathing a  little better.  He tells me that his fever seems to be subsiding.  He is  able to tolerate liquid diet.  Denies any other complaints at this time.   OBJECTIVE:  VITAL SIGNS:  T-max 102.7 - last recorded temperature 99.5,  heart rate 99, respiratory rate 20, blood pressure 134/81, saturation  94% on 2 liters.  GENERAL EXAM:  This is a well-developed, well-nourished male in no  distress.  HEENT:  There is no pallor, no icterus.  Oral mucous membrane is moist.  No oral lesions are noted.  NECK:  Soft and supple.  No thyromegaly is appreciated.  LUNGS:  Reveal dullness to percussion on the left side greater than the  right.  There are a few crackles at the bases.  No wheezing is present.  Few rhonchi also appreciated.  CARDIOVASCULAR:  S1-S2, tachycardiac, regular.  I still do not  appreciate any murmurs.  No rubs, no bruits.  ABDOMEN:  There is soft tenderness in the epigastric area.  There is  some degree of guarding.  No rebound rigidity is present.  Bowel sounds  are present.  GENITOURINARY EXAMINATION:  Is unchanged.  NEUROLOGIC:  He is alert and oriented x3.  No focal neurological  deficits are present.   LABORATORY DATA:  His white count actually has gone up today to 19,300  with the differential showing 89% neutrophils, 6% lymphocytes.  Absolute  lymphocyte count is within the normal range.  Toxic granulation noted.  Hemoglobin is 9.2, MCV is 99.8, platelet count is 412.  His electrolytes  are pending this morning.  UA done last night did not show any evidence  for  infection.  Blood cultures are pending.  Lyme titers are pending.   ASSESSMENT/PLAN:  1. Fever, etiology again unclear.  As discussed in my note yesterday,      I am concerned about various possibilities including Lyme disease      and infectious endocarditis.  Evaluation is pending for these.  We      will empirically start the patient on doxycycline considering his      tick bite.  An echocardiogram was also ordered.  He is on broad-      spectrum coverage with vancomycin, imipenem, azithromycin and      doxycycline.  He is also on Tamiflu until tomorrow per ID      recommendations.  If his fevers do not resolve by tonight, we will      have to again discuss this issue with ID.  Of note, he did have a      CT abdomen and pelvis which was repeated  on March 29, and apart      from the evidence for possible pneumonia and pleural effusions, the      pancreas again looked edematous with marked inflammatory changes      and fluid extending into the bilateral perinephric space.  No      evidence for any abscess was noted.  We may have to repeat another      CT with contrast.  The patient continues to remain febrile.  Other      considerations should also include thoracentesis.  I will also go      ahead and test him for HIV and also send HIV viral load  2. Pneumonia.  He is on antibiotics.  Continue with the same.  3. Acute pancreatitis.  Gastroenterology is managing the issue.  He is      on a liquid diet at this time.  His pain appears to be well      controlled.  4. Anemia.  Hemoglobin and hematocrit had a slight degree of drop      today.  Continue to follow closely.  Gastroenterology is      considering EGD in the near future.  5. Hypokalemia.  Was repeated.  We are awaiting his labs from this      morning.  6. History of alcoholism.  He has been counseled.  He is on thiamine.      He has completed his Ativan protocol.  7. Basically, in summary, we are evaluating his fever.  We are  waiting      for the fevers to subside.  The source of the fever is unclear.      This is the most concerning thing in his whole clinical picture at      this time.  ID input again required later today.      Osvaldo Shipper, MD  Electronically Signed     GK/MEDQ  D:  02/21/2009  T:  02/21/2009  Job:  [id number]

## 2011-04-06 NOTE — Discharge Summary (Signed)
[name], SCHANZ             ACCOUNT NO.:  [id number]   MEDICAL RECORD NO.:  [id number]           PATIENT TYPE:   LOCATION:                                 FACILITY:   PHYSICIAN:  Osvaldo Shipper, MD     DATE OF BIRTH:  08-01-1961   DATE OF ADMISSION:  02/15/2009  DATE OF DISCHARGE:  04/14/2010LH                               DISCHARGE SUMMARY   The patient does not have a PMD.   The patient was seen in consultation during this hospitalization by  gastroenterology and he was also seen by urology, Dr. Dennie Maizes,  for scrotal swelling.  He was seen by Dr. Juanetta Gosling for pneumonia.  He was  seen by Dr. Malvin Johns for inguinal hernia.   Procedures undergone during this admission include a capsule study which  showed a small area of eroded mucosa in the small bowel.  No other  abnormalities were noted.  He underwent an EGD and colonoscopy.  The  colonoscopy showed a normal rectum.  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  He also underwent an EGD which apparently did not show any acute  abnormalities.  I do not see the report in the computer.   DISCHARGE DIAGNOSES:  1. Acute alcoholic pancreatitis, improved.  2. Bilateral pneumonia, improved.  3. Left-sided groin pain with possibly inguinal herniation.  4. Anemia, etiology unclear, stable.  5. History of alcoholism, counseled.   Please review the H and P dictated at the time of admission for details  regarding the patient's presenting illness.   Briefly, this is a 50 year old African American male who had a history  of a significant amount of alcohol consumption who presented to the  hospital with abdominal pain and he was found to have acute  pancreatitis.  This was thought to be related to alcohol consumption.  A  CT of the abdomen and pelvis was done, which showed moderate-to-marked  pancreatitis.  A lot of other nonspecific findings were also noted.   I  would encourage you to review the report of the CT scan.  He underwent  subsequently an ultrasound of the abdomen, which revealed pancreatitis  but a normal-appearing gallbladder.  Fatty liver was also noted.  The  patient was started on supportive treatment.  He was kept n.p.o.  Gastroenterology consulted on this patient.  He had to undergo 2 more  CAT scans of his abdomen and pelvis during the course of the admission,  which revealed, again, severe pancreatitis with no evidence for  necrosis.  The patient, over the past week or so, has been started on an  oral diet.  His pain is only minimal.  He is tolerating his diet quite  well.   During the course of this admission the patient also became febrile.  He  was febrile for almost 10 days.  The etiology for this was thought to be  multifactorial including pneumonia, pancreatitis and possibly even drug  fever.  He was on broad-spectrum antibiotic coverage, which were all  discontinued after 7-  to 10-day courses and his fevers finally have  resolved.  He also underwent a Doppler study which did not reveal any  evidence for DVTs.  No other specific etiology for the fever was found.   He also developed a pneumonia during this admission.  He was probably  third spacing because of his pancreatitis.  He also developed pleural  effusions.  He was put on antibiotics and he has significantly improved  at this time.   He also had electrolyte abnormalities, which have also been corrected.   He was also anemic with heme-positive stools requiring blood  transfusions.  CT did not reveal any evidence for retroperitoneal  bleeding.  He underwent, as discussed above, EGD, colonoscopy and a  capsule study, which have not specifically revealed any source of GI  bleeding.  His hemoglobin has been stable for the last 5-6 days.   He has been counseled to avoid alcohol consumption.  He has also been  counseled regarding smoking cessation and illicit  drug use.  On the day  of discharge the patient is feeling well.  He is still complaining of on-  and-off pain in his left groin.  Otherwise denies any nausea, vomiting.  He says the swelling in the groin tends to increase when he stands up.   Vital signs show that he is afebrile.  Blood pressure 142/89, heart rate  84, respiratory rate 16, saturation 94% on room air.  His lungs are clear to auscultation bilaterally.  CARDIOVASCULAR:  S1, S2 is normal, regular.  No bruits are noted.  GENERAL EXAM:  This is a well-developed, well-nourished young male in no  distress.  No lymphadenopathy was noted.  GU:  Examination does reveal swelling in the left groin.  With cough, it  does increase slightly in size.  It is reducible.  It is nontender.  EXTREMITIES:  Show no edema.  MUSCULOSKELETAL:  Unremarkable.  NEUROLOGIC:  He is alert, oriented x3.  No focal neurological deficits  are present.   Overall he is stable for discharge.   FOLLOWUP:  1. With GI in the next few weeks.  2. With Dr. Malvin Johns for inguinal hernia in the next 2-3 weeks.   DIET:  Lactose-free, low-fat diet.   MEDICATIONS:  1. Percocet 5/325 one tablet every 6 hours as needed for pain, 20      tablets have been prescribed.  2. Flora-Q 1 capsule daily.  3. Multivitamin with minerals 1 tablet daily.  4. Prilosec 40 mg daily.  5. Phenergan as needed.   TOTAL TIME ON THIS DISCHARGE ENCOUNTER:  35 minutes      Osvaldo Shipper, MD  Electronically Signed     GK/MEDQ  D:  03/05/2009  T:  03/05/2009  Job:  [id number]   cc:   Kassie Mends, M.D.  [address]  [place] , [place] [address]   Barbaraann Barthel, M.D.  Fax: [phone]   Juanetta Gosling, MD

## 2011-04-06 NOTE — Consult Note (Signed)
NAME:  [name], [name]             ACCOUNT NO.:  [id number]   MEDICAL RECORD NO.:  [id number]         PATIENT TYPE:  PINP   LOCATION:  A305                          FACILITY:  [facility]   PHYSICIAN:  Edward L. Juanetta Gosling, M.D.DATE OF BIRTH:  [birth year]   DATE OF CONSULTATION:  DATE OF DISCHARGE:                                 CONSULTATION   The patient of [facility].   REASON FOR CONSULTATION:  Pneumonia.   HISTORY:  [name] is a 50 year old African American male who came to  the emergency room with pancreatitis.  He has had a difficult course so  far with recurrent high fevers.  His pancreatitis actually looks a  little worse on x-ray and he has what appears to be a pneumonia in right  upper lobe.  I suspect that this well maybe from aspiration.  He has  been being seen by the GI physicians for his pancreatitis, and he has  improved to some extent.  He still had some fever.  He says he is  coughing, but he is not coughing anything up.   PAST MEDICAL HISTORY:  Positive essentially just for hypertension and  for knee surgery.   SOCIAL HISTORY:  He does not use any tobacco or illicit drugs, but he  does drink about 12 beers a day for 20 years or so.   FAMILY HISTORY:  Not known to include any respiratory problems.  His  father has diabetes and so do two siblings.  His mother died of some  sort of a cancer, but it is not clear what that was.   REVIEW OF SYSTEMS:  Except as mentioned is negative.   PHYSICAL EXAMINATION:  GENERAL:  Well developed, well nourished thin  male who is in no acute distress.  VITAL SIGNS:  Temperature 100.5, pulse 96, respirations 14, blood  pressure 135/74, O2 sat is 93% on 4 L.  HEENT:  Pupils are equal, round, reactive to light and accommodation.  His nose and throat are clear.  His mucous membranes are moist.  NECK:  Supple without masses.  CHEST:  Actually fairly clear despite the pneumonia on chest x-ray.  HEART:  Regular without  gallop.  ABDOMEN:  Soft, slightly protuberant, and slightly tender.  EXTREMITIES:  Trace to 1+ edema.  CENTRAL NERVOUS SYSTEM:  Grossly intact.   RECENT LABORATORY WORK:  CBC today shows white count 19,300, hemoglobin  is 8.8, platelets 499.  Comp metabolic profile shows his potassium is  2.8, glucose of 175, SGOT 53, albumin is 1.9.  He has 4 recent blood  cultures all of which are negative.  HIV antibody is negative.  BNP was  slightly elevated at 244.  Magnesium normal at 1.8.   ASSESSMENT:  Then, I think he has pneumonia.  I think it is very likely  that this represents aspiration based on its location.  He is certainly  on adequate antibiotic coverage.  He has excellent antibiotics and I  think part of his fever is not so much from this pneumonia, although I  think it is a contributor, but I think it is  also from his pancreatitis  and ongoing inflammation.  I think he is on excellent treatment at this  point and we will continue with what we are doing.   Thank you very much for allowing me to see him with you.      Edward L. Juanetta Gosling, M.D.  Electronically Signed     ELH/MEDQ  D:  02/22/2009  T:  02/23/2009  Job:  [id number]   cc:   Kirk Ruths, M.D.  Fax: [phone]

## 2011-04-06 NOTE — Group Therapy Note (Signed)
[name], [name]             ACCOUNT NO.:  [id number]   MEDICAL RECORD NO.:  [id number]          PATIENT TYPE:  INP   LOCATION:  A305                          FACILITY:  [facility]   PHYSICIAN:  Osvaldo Shipper, MD     DATE OF BIRTH:  [birth year]   DATE OF PROCEDURE:  02/23/2009  DATE OF DISCHARGE:                                 PROGRESS NOTE   This is day 9 of his hospital admission.   SUBJECTIVE:  The patient overall is feeling slightly better.  His  breathing is better.  He is not getting short of breath when he moves  around within the room.  However, he is complaining of pain in his left  side which also is present in the back.  He required pain medication for  this overnight.  This pain has been present for the last 24 hours.  Denies any nausea, vomiting.  Continues to have multiple BMs on a daily  basis.  He denies any other new complaints apart from the above.   VITAL SIGNS:  Continues to be afebrile, temperature 101.2 overnight, T-  max was 102.4 yesterday afternoon. Heart rate 100, respiratory 20, blood  pressure 130/75, saturation 93% on 2 liters.  GENERAL EXAM:  A pleasant African American male in no distress.  HEENT:  There is no pallor, no icterus.  Oral mucous membranes moist.  No oral lesions are noted.  NECK:  Soft and supple.  LUNGS:  Improved aeration bilaterally; dullness to percussion in  bilateral bases.  No rhonchi.  No wheezing is appreciated.  CARDIOVASCULAR:  S1 and S2 normal, regular.  No S3, S4, no rubs, no  bruits.  No murmurs appreciated.  ABDOMEN:  Soft.  There is tenderness in the epigastric area and the left  upper quadrant as well.  There is some degree of guarding present.  No  rebound or rigidity is present.  Bowel sounds are present.  EXTREMITIES:  Show 1 to 2+ pitting edema bilaterally.  No calf  tenderness is present.   LABORATORY DATA:  White count is 18,200, a slight decrease from 19.3  yesterday.  Hemoglobin is 8.8, platelet count is 563.   Potassium is 3.4,  glucose 149.  Vancomycin trough 12.7.  C. diff is pending.  Blood  cultures have been negative.   ASSESSMENT/PLAN:  1. Fever. The patient has continued to spike fever at this time.      Today will be day 8 of imipenem, day 6 of vancomycin, day 5 of      azithromycin and day 4 of doxycycline.  I think we should be able      to discontinue vancomycin after tomorrow's dose and the same with      imipenem.  This is based on ID recommendations.  I would continue      azithromycin for total of 7 days and doxycycline for a total of 7      days.  He has already finished a course of Tamiflu.  The etiology      for the fever is multifactorial, could be secondary to  pancreatitis.  He also has a pneumonia.  It could be drug related      fever, we are trying to  rule out other etiologies, so I am going      to have the nursing staff change his IV site if it has not been      changed in the last 2 days and we will check a venous Doppler study      to make sure there is no DVT.  2. Acute pancreatitis.  This appears to be improving clinically      although he is now complaining of new pain in his abdomen.  I will      go ahead and check another lipase level and we will see what Dr.      Kassie Mends has to say regarding this new development.  3. Mild fluid overload.  We will give him a small dose of Lasix today.  4. Hypokalemia.  This will be repleted.  5. Anemia.  Hemoglobin remains stable is 8.8. I will not transfuse him      today.  Will recheck it again tomorrow.  6. Pneumonia, aspiration versus hospital-acquired.  He is on      antibiotics as above.  Dr. Juanetta Gosling has seen him, we appreciate is      input.  7. History of alcoholism.  Counseling has been provided.  8. Hypoalbuminemia.  He is he is getting nutritional supplements and      he is on low fat diet.  9. He is a full code.   Overall, the patient is clinically stable, clinically appears to be  somewhat improving  in certain aspects; however, he continues to have  fever and he has this new abdominal and back pain.      Osvaldo Shipper, MD  Electronically Signed     GK/MEDQ  D:  02/23/2009  T:  02/23/2009  Job:  [id number]

## 2011-04-06 NOTE — Group Therapy Note (Signed)
NAME:  ELIAM, SNAPP             ACCOUNT NO.:  [id number]   MEDICAL RECORD NO.:  [id number]         PATIENT TYPE:  PINP   LOCATION:  A305                          FACILITY:  [facility]   PHYSICIAN:  Edward L. Juanetta Gosling, M.D.DATE OF BIRTH:  1961-08-15   DATE OF PROCEDURE:  03/04/2009  DATE OF DISCHARGE:                                 PROGRESS NOTE   Mr. Boateng is overall much improved.  He states he is feeling better.  His abdomen is better.   His physical examination shows that he is awake and alert.  His  temperature is 99.1, pulse 85, respirations 20, blood pressure 135/75,  and O2 sat is 97% on room air.  His abdomen is much softer.  His chest  is better and his chest x-ray looks better as well.   ASSESSMENT:  He has pancreatitis.  He has had what I think is possibly  an aspiration pneumonia, which appears to have cleared.  I am going to  sign off at this point.  He will need chest x-rays serially until his  infiltrate is totally cleared.   Thanks for allowing me to see him with you.      Edward L. Juanetta Gosling, M.D.  Electronically Signed     ELH/MEDQ  D:  03/04/2009  T:  03/05/2009  Job:  [id number]

## 2011-04-06 NOTE — Group Therapy Note (Signed)
NAME:  [name], [name]             ACCOUNT NO.:  [id number]   MEDICAL RECORD NO.:  [id number]          PATIENT TYPE:  INP   LOCATION:  A305                          FACILITY:  [facility]   PHYSICIAN:  Edward L. Juanetta Gosling, M.D.DATE OF BIRTH:  04-28-1961   DATE OF PROCEDURE:  DATE OF DISCHARGE:                                 PROGRESS NOTE   The patient of the [facility].   SUBJECTIVE:  [name] continues to have fever and had a temperature  as high as 102.  He says he is feeling okay, he is still having some  abdominal discomfort.  He is not coughing.  He is not congested.   PHYSICAL EXAMINATION:  VITAL SIGNS:  This morning, temperature was 102  last night, 99 now; pulse is 99; respirations 18; blood pressure 123/70;  and O2 sats 95% on 3 L.  CHEST:  Actually pretty clear.  HEART:  Regular.  ABDOMEN:  Soft.  EXTREMITIES:  No edema.   White count 16,200, hemoglobin is 8.1, and platelets 617.  BMET shows  his potassium is normal at 3.6, his sodium is slightly low at 133, and  glucose of 129.  C. diff negative.   ASSESSMENT:  He has fever.  I still think this is more likely from his  abdominal problem than his lung problem.   My plan is to continue with his current treatment and follow.  He is  certainly on adequate antibiotic coverage.      Edward L. Juanetta Gosling, M.D.  Electronically Signed     ELH/MEDQ  D:  02/24/2009  T:  02/25/2009  Job:  [id number]

## 2011-04-06 NOTE — Group Therapy Note (Signed)
NAME:  [name], [name]             ACCOUNT NO.:  [id number]   MEDICAL RECORD NO.:  [id number]          PATIENT TYPE:  INP   LOCATION:  A305                          FACILITY:  [facility]   PHYSICIAN:  Edward L. Juanetta Gosling, M.D.DATE OF BIRTH:  1961-03-16   DATE OF PROCEDURE:  DATE OF DISCHARGE:                                 PROGRESS NOTE   Patient of the [facility] Team.   SUBJECTIVE:  Mr. Burgert states that he is doing okay.  He has no new  complaints.  He does have a history of asbestos exposure about 8 or 9  years ago, and he is concerned that that may have something to do with  his current cardiac or pulmonary problems.  I have told him that that is  not showing on a CT, so I think he is at least not showing current signs  of any asbestos-related pulmonary disease.  He states that his abdomen  is giving him a little more problem now.   PHYSICAL EXAMINATION:  VITAL SIGNS:  Temperature is 101.9, pulse 115,  respirations 22, blood pressure 110/75, and O2 sat 90% on 4 L.  CHEST:  Actually, relatively clear.  HEART:  Regular.  ABDOMEN:  Mildly distended.   His lipase this morning is 31.  Blood cultures from the first there were  no growth thus far.  BMET this morning shows that his potassium is 3.4,  BUN of 5, creatinine 0.92, and a CBC shows white count is 18,200, which  is down a bit from yesterday.  His hemoglobin level was 8.8, which is  stable and platelets 563.   ASSESSMENT:  He is better.  In some ways, he still has fever, he still  has obviously problems with his pancreatitis.   PLAN:  Continue with his treatments and follow.      Edward L. Juanetta Gosling, M.D.  Electronically Signed     ELH/MEDQ  D:  02/23/2009  T:  02/23/2009  Job:  [id number]

## 2011-04-06 NOTE — Op Note (Signed)
NAME:  [name], [name]             ACCOUNT NO.:  [id number]   MEDICAL RECORD NO.:  [id number]          PATIENT TYPE:  INP   LOCATION:  A305                          FACILITY:  [facility]   PHYSICIAN:  R. Roetta Sessions, M.D. DATE OF BIRTH:  03/07/1961   DATE OF PROCEDURE:  03/01/2009  DATE OF DISCHARGE:                               OPERATIVE REPORT   PROCEDURE:  Ileocolonoscopy and snare polypectomy.   INDICATIONS FOR PROCEDURE:  A pleasant 50 year old gentleman admitted to  the hospital with alcohol induced pancreatitis.  He has also had melena  with a significant drop in his hemoglobin requiring transfusion.  Hospital course complicated by pneumonia.  EGD yesterday demonstrated  duodenitis, but no finding by Dr. Cira Servant to really explain the etiology  of the GI bleed.  Consequently, colonoscopy is now being done.  Given  alcohol abuse, __________OR as was done for his EGD yesterday.  The  risks, benefits, alternatives and limitations have been reviewed,  questions answered and all parties agreeable.   PROCEDURE NOTE:  The patient was placed in the left lateral decubitus  position.  Sedation provided by Anesthesia.   INSTRUMENT:  Pentax video chip system.   FINDINGS:  Digital rectal revealed no abnormalities.  The prep was  adequate.  Colon:  Colonic mucosa was surveyed from the rectosigmoid  junction through the left transverse, right colon and appendiceal  orifice, ileocecal valve and cecum.  These structures were well seen and  photographed for the record.  The terminal ileum was intubated to 15 cm.  From this level, the scope was slowly and cautiously withdrawn.  All  previously mentioned mucosal surfaces were again seen.  The patient had  scattered narrow-mouth diverticula throughout the colon (more pronounced  on the right side than the left).  There was a 9-mm pedunculated polyp  at the rectosigmoid junction.  The remainder of the colonic mucosa  appeared unremarkable as did the  terminal ileum mucosa.  There was no  blood in the colon.  The polyp was resected with hot snare cautery and  recovered through the scope.  The scope was pulled down into the rectum  where thorough examination of the rectal mucosa, including retroflexion  of the anal verge demonstrated no abnormalities.  The patient tolerated  the procedure well and was reacted in endoscopy.  Cecal withdrawal time  8 minutes.   IMPRESSION:  1. Normal rectum.  2. __________ polyp in the rectosigmoid, status post hot snare      removal.  3. Pancolonic diverticula (right side greater than left.  The      remainder of the colonic mucosa appeared normal and normal terminal      ileum.   [name] has suffered a significant GI bleed with an essentially  negative EGD and a colonoscopy.  I recommend we go ahead and proceed  with a capsule study of his small bowel while he is here.  This approach  has been  discussed with the patient at length.  The risks, benefits, alternatives  and limitations have been reviewed.  We will plan to deploy the capsule  tomorrow morning.  Further recommendations to follow.   Follow-up on path.      Jonathon Bellows, M.D.  Electronically Signed     RMR/MEDQ  D:  03/01/2009  T:  03/01/2009  Job:  [id number]   cc:   Hospitalist Team   Oneal Deputy. Juanetta Gosling, M.D.  Fax: [phone]

## 2011-04-06 NOTE — Group Therapy Note (Signed)
[name], [name]             ACCOUNT NO.:  [id number]   MEDICAL RECORD NO.:  [id number]          PATIENT TYPE:  INP   LOCATION:  A305                          FACILITY:  [facility]   PHYSICIAN:  Osvaldo Shipper, MD     DATE OF BIRTH:  06-27-1961   DATE OF PROCEDURE:  02/20/2009  DATE OF DISCHARGE:                                 PROGRESS NOTE   SUBJECTIVE:  The patient is actually feeling a little bit better today.  He says he is breathing better.  He denies any nausea or vomiting.  He  denies any other symptoms.  He still has abdominal pain, which is also  improving.  He also mentioned to me that about a week prior to his  admission here he had two tick bites and his girlfriend had to remove  the ticks off his inner thigh on the left side.   OBJECTIVE:  VITAL SIGNS:  Temperature, he is still spiking fevers, this  morning it was 101.4, heart rate 107, respiratory rate 20, blood  pressure 132/65.  Saturation 93%, I believe on O2.  Ins and outs, he was  positive by 943 mL yesterday.  GENERAL:  This is a well-developed, well-nourished African American male  in no distress.  HEENT:  There is no pallor, no icterus.  Oral mucous membrane is moist.  No oral lesions are noted.  NECK:  Soft and supple.  No thyromegaly is  appreciated.  LUNGS:  Reveal crackles and rhonchi at the bases, but mostly clear to  auscultation.  No wheezing is present.  CARDIOVASCULAR:  S1-S2 is normal regular.  He is tachycardiac actually.  No S3-S4.  A possible S1 split is heard.  I do not appreciate any  murmurs with various body positions and with holding his breath, etc.  ABDOMEN:  Soft, still pretty tense.  He has got some distention.  Tenderness in the epigastric area is present.  No rebound, rigidity or  guarding.  Bowel sounds are sluggish.  EXTREMITIES:  Show minimal edema.  GU:  He has got swelling of his scrotum, as well as his penis which  appear to be stable.  EXTREMITIES:  Left thigh reveals no  evidence for erythema, but the areas  where the tick was removed is clearly visible.  NEUROLOGIC:  He is alert and oriented x3.  No focal neurological  deficits are present.   LABORATORY DATA:  His white count is 16,700 with 89% neutrophils, no  bands reported today.  Hemoglobin is 9.4.  His platelet count is 340,  potassium was 3.1, glucose was 173.  Legionella was negative in the  urine.  H1N1, as well as influenza A was negative.  Blood cultures done  on February 17, 2009, are negative.  Sputum cultures have been ordered but  not done yet.  Urine culture was negative.   RECENT IMAGING STUDIES:  He had a two-view chest film yesterday which  showed persistent bilateral inferior effusions, pleural effusion that is  and bibasilar air space disease, new patchy opacity in the right mid  lung, thought to be atelectasis versus pneumonia.  ASSESSMENT AND PLAN:  1. Fever.  Etiology so far unclear, although he does have evidence for      pancreatitis and does have pneumonia which could be causing the      fever, but he is still febrile after being on broad-spectrum      antibiotic coverage in the form of vancomycin, imipenem and      azithromycin.  This is very concerning.  He is also on Tamiflu per      ID recommendations.  Based on the history provided by the patient      regarding a tick bite, I am concerned about Lyme disease as well.      I will send Lyme titers.  I will repeat blood cultures and I will      start him on doxycycline.  I will also get an echocardiogram to      make sure there is no evidence for endocarditis.  So far, however,      the blood cultures have been negative.  He continues to be      hemodynamically stable at this time.  2. Possible pneumonia.  He is on antibiotics, will continue with the      same.  3. Acute pancreatitis, GI is managing this issue and they have      advanced his diet and we will let them handle this issue at this      point.  The etiology for  his pancreatitis was alcohol.  4. Anemia.  Hemoglobin and hematocrit stable.  He was actually      transfused 1 unit of blood 2 days ago.  There is no evidence for GI      bleeding.  Gastroenterology is considering an      esophagogastroduodenoscopy.  His hemoglobin continues to drop.  5. Hypokalemia.  Will replete it.  6. History of alcoholism.  He has completed his Ativan protocol.      Counseling has been provided.  He is on thiamine.  7. IV fluids will be continued for now.   So basically we are awaiting his fever to resolve.  Testing will be  ordered as mentioned above.  We will continue to monitor this patient on  a very close basis.      Osvaldo Shipper, MD  Electronically Signed     GK/MEDQ  D:  02/20/2009  T:  02/20/2009  Job:  [id number]

## 2011-04-06 NOTE — Group Therapy Note (Signed)
NAME:  [name], [name]             ACCOUNT NO.:  [id number]   MEDICAL RECORD NO.:  [id number]         PATIENT TYPE:  PINP   LOCATION:  A305                          FACILITY:  [facility]   PHYSICIAN:  Edward L. Juanetta Gosling, M.D.DATE OF BIRTH:  1961-05-19   DATE OF PROCEDURE:  03/03/2009  DATE OF DISCHARGE:                                 PROGRESS NOTE   Patient of the [facility] Hospitalist Team.   [name] states he is feeling okay.  He has what feels like a small  nodule in his left groin that he states is painful but I do not think it  is a hernia, I think it may perhaps be a lymph node or even area of  something like fat necrosis.  He is feeling better.  He is having little  bit less fever.   His physical examination today shows that his temperature is 98.4, pulse  is 95, respirations 16, and blood pressure 124/60.  He is now being  afebrile for approximately 24 hours.  He has no complaints of any  respiratory symptoms.  His O2 sat is 98% at 7:35.   My plan then I am going to have him get another chest x-ray and then  depending on the results of that I may sign off, I think his major  problem of course is his abdominal problem and I think his chest problem  is a secondary issue that may be totally related to his severe  pancreatitis.      Edward L. Juanetta Gosling, M.D.  Electronically Signed     ELH/MEDQ  D:  03/03/2009  T:  03/03/2009  Job:  [id number]

## 2011-04-06 NOTE — Group Therapy Note (Signed)
NAME:  SHAHIN, KNIERIM             ACCOUNT NO.:  [id number]   MEDICAL RECORD NO.:  [id number]          PATIENT TYPE:  INP   LOCATION:  A305                          FACILITY:  [facility]   PHYSICIAN:  Edward L. Juanetta Gosling, M.D.DATE OF BIRTH:  Jul 09, 1961   DATE OF PROCEDURE:  02/27/2009  DATE OF DISCHARGE:                                 PROGRESS NOTE   This patient of [facility] Hospitalist Team, who said Mr. Nghiem is  overall about the same.  He continues to have some problems with his  abdomen.  He said that everything else is about the same.  He has no  other new complaints except he now has what appears to be an inguinal  hernia.  As far as his chest is concerned, I think the major problem  with his chest is actually related to his pancreatitis.  His exam now  shows that his chest is fairly clear, his heart is regular, his abdomen  is soft but distended.  He does have an inguinal hernia.  Vital signs  are as recorded.   ASSESSMENT:  He has significant problems with severe pancreatitis, and  he has abnormalities on his chest x-ray that I think are related to  that.  He has not had much fever now.  My plan then, he has got a  surgical consult ordered, and I am going to plan to continue with all of  his other treatments and try to get him cleared up as far as his chest  is concerned.      Edward L. Juanetta Gosling, M.D.  Electronically Signed     ELH/MEDQ  D:  02/27/2009  T:  02/28/2009  Job:  [id number]
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
## 2011-04-06 NOTE — Group Therapy Note (Signed)
NAME:  [name], [name]             ACCOUNT NO.:  [id number]   MEDICAL RECORD NO.:  [id number]          PATIENT TYPE:  INP   LOCATION:  A305                          FACILITY:  [facility]   PHYSICIAN:  Melissa L. Ladona Ridgel, MD  DATE OF BIRTH:  June 05, 1961   DATE OF PROCEDURE:  03/04/2009  DATE OF DISCHARGE:                                 PROGRESS NOTE   SUBJECTIVE:  The patient was seen and examined.  He states his symptoms  have improved significantly.  He is tolerating full diet.  He has no  complaints of nausea, vomiting or diarrhea.   OBJECTIVE:  Temperature T-max is 99.1, temperature 98.5, blood pressure  149/80, pulse 86, respirations 20, saturation 98%.  GENERAL:  This is a thin, African-American male in no acute distress.  He is moderately nourished.  He is normocephalic, atraumatic.  Pupils  are equal, round, and reactive to light.  Extraocular muscles are  intact.  Mucous membranes are moist.  He has anicteric sclerae.  Examination of the external ears reveals no lesions or discharge.  Examination of the nose:  Septum is midline without discharge.  Examination of the mouth reveals no oral lesions or discharge.  NECK:  Is supple.  There is no JVD.  No lymph nodes.  No carotid bruits.  CHEST:  Clear to auscultation.  There are no rhonchi, rales or wheezes.  CARDIOVASCULAR:  Regular rate and rhythm.  Positive S1-S2.  No S3-S4.  No murmurs, rubs, or gallops.  ABDOMEN:  Soft, nontender, nondistended with positive bowel sounds.  There is no hepatosplenomegaly noted.  No masses or hernias.  EXTREMITIES:  Show radial pulses 2+.  No edema.  NEUROLOGICAL:  The patient is awake, alert, oriented.  Cranial nerves II-  XII are intact.  Power appears to be 5/5.  Gait is steady.   PERTINENT LABORATORY DATA:  His white count is down to 8.8 with a  hemoglobin of 9.4, hematocrit 27.3, and platelets 650.   ASSESSMENT AND PLAN:  This is a 50 year old African-American male with  recent pancreatitis  and possible upper gastrointestinal bleed.  The  patient underwent a capsule study which showed a small-bowel erosion,  colon polyp in the rectum.  Recommendations are for him to avoid alcohol  and follow up with Dr. Cira Servant as an outpatient.   1. Pancreatitis.  Symptomatically resolving.  Tolerating p.o.  The      patient can likely discharge in the morning if he continues to be      stable.  2. With regards to his left inguinal hernia, he was seen and evaluated      by surgery and can have this worked up as an outpatient.  3. The patient remains on Flagyl p.o. and presume this is for      clostridium difficile toxin coverage.  His clostridium difficile      toxin levels have been negative.  I will allow the primary      attending in the a.m. to reevaluate his need for further Flagyl      therapy since he has had no further diarrhea.  4.  Pneumonia.  Low-grade fever with no cough, treated with antibiotic,      namely Avelox and Primaxin as well as Flagyl.  Chest x-ray was      negative.  He has had small effusions but no obvious infection.      Continue to monitor his low-grade fever of 99 tonight to see if      that progresses any further.  5. Ethanol abuse.  No signs of withdrawal at this time.  The patient      has been counseled on discontinuation of alcohol use.  He should      continue likely at home with multivitamin therapy and perhaps      thiamine therapy if tolerated.  6. Anemia, likely secondary to acute on chronic losses.  I will check      an iron panel in the a.m. to facilitate outpatient care and      supplementation.   Total time with this patient was 20 minutes.      Melissa L. Ladona Ridgel, MD  Electronically Signed     MLT/MEDQ  D:  03/05/2009  T:  03/05/2009  Job:  [id number]

## 2011-04-09 NOTE — Procedures (Signed)
NAMESIGISMUND, CROSS            ACCOUNT NO.:  [id number]   MEDICAL RECORD NO.:  [id number]          PATIENT TYPE:  OBV   LOCATION:  A223                          FACILITY:  [facility]   PHYSICIAN:  Muir Bing, M.D. [facility] OF BIRTH:  12-Jun-1961   DATE OF PROCEDURE:  09/21/2005  DATE OF DISCHARGE:                                    STRESS TEST   REFERRING PHYSICIAN:  Calvert Cantor, M.D.   CLINICAL DATA:  A 50 year old gentleman admitted to hospital with chest  pain.  Cardiovascular risk factors include only borderline hypertension.   RESULTS:  1.  The patient performed treadmill exercise to a work load of 13 METS and a      heart rate of 184, 104% of age predicted maximum.  Exercise was      discontinued due to fatigue and leg fatigue, no chest pain was reported.  2.  Blood pressure increase from a resting value of 150/85 to 190/80 during      exercise and 220/80 early in recovery.  A mildly hypertensive response.      No arrhythmias noted.   BASELINE ELECTROCARDIOGRAM:  Normal sinus rhythm within normal limits.   STRESS ELECTROCARDIOGRAM:  No significant change.   IMPRESSION:  Adequate and negative graded exercise test.  Mildly  hypertensive response.  Other findings as noted.       Bing, M.D. [facility]  Electronically Signed     RR/MEDQ  D:  09/21/2005  T:  09/21/2005  Job:  [id number]

## 2011-04-09 NOTE — H&P (Signed)
NAMEAUBERT, CHOYCE            ACCOUNT NO.:  [id number]   MEDICAL RECORD NO.:  [id number]          PATIENT TYPE:  OBV   LOCATION:  A223                          FACILITY:  [facility]   PHYSICIAN:  Margaretmary Dys, M.D.DATE OF BIRTH:  19-Mar-1961   DATE OF ADMISSION:  09/20/2005  DATE OF DISCHARGE:  LH                                HISTORY & PHYSICAL   ADMITTING DIAGNOSES:  1.  Chest pain, rule out myocardial infarction.  2.  Uncontrolled hypertension.   PRIMARY CARE PHYSICIAN:  The patient is unassigned.   CHIEF COMPLAINT:  Chest pain.   HISTORY OF PRESENT ILLNESS:  [name] is a 50 year old African-American  male with no significant past medical history who presents with left-sided  chest pressure.  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  He describes a pressure, mid sternum on his left  side which was nonradiating, 10/10 at its worst.  He said he had some  trouble taking breaths but denies any diaphoresis.  He denies any present  history of fever chills or rigors.  No cough, no shortness of breath.  This  is an index of episode of chest pain for him.  His pain lasted up until the  time he was seen here in our emergency room and he said the pain only got  better after he received some aspirin and nitroglycerine down in the  emergency room.  He also received Ativan 1 mg.  He currently has no chest  pain and reports that his pain is 0/10.  He is fairly active working the  [employer] business since the 2001.  He did notice that the pain was  slightly exacerbated when he moved around.  The patient is being admitted  now for evaluation of his cardiac status.   REVIEW OF SYSTEMS:  A 10-point review of systems is as per History of  Present Illness.  He denies any weight loss, no hematuria.  No nausea,  vomiting, diarrhea, abdominal pain, no blood in his stools.   PAST MEDICAL HISTORY:  None.   PAST SURGICAL HISTORY:  None.   MEDICATIONS:  None.   ALLERGIES:  No known drug allergies.   FAMILY HISTORY:  Positive for father with diabetes, a brother and sister  with diabetes.  His mother died of metastatic cancer of unknown etiology.  There is no family history of stroke or premature heart disease or coronary  artery disease.   SOCIAL HISTORY:  The patient is single, lives with his girlfriend.  He is a  life-long nonsmoker.  He does only drinks socially.  He denies any IV drug  abuse or crack cocaine use.  He works in [employer] and has  been doing this job since 2001.  He remains fairly active.   PHYSICAL EXAMINATION:  GENERAL:  Conscious, alert, comfortable, not in acute  distress, well-oriented in time, place and person.  VITAL SIGNS:  Blood pressure back at [place] [employer] [place]  site was interpreted to be 182/86 at one point, pulse 98, respiratory rate  was not  documented but his oxygen saturation was 98% on room air.  Blood  sugar on this event was 137.  Here his blood pressure peaked at 158/80,  pulse 115, respiratory rate 16, saturation 98% on room air, temperature 98.8  degrees Fahrenheit.  HEENT:  Normocephalic, atraumatic.  Oral mucosa was moist with no exudate.  NECK:  Supple, no JVD or lymphadenopathy.  LUNGS:  Clear with good air entry bilaterally.  HEART:  S1/S2 regular, no S3/S4 gallops or rubs.  No murmurs were heard.  ABDOMEN:  Soft, nontender, bowel sounds positive.  No masses palpable.  EXTREMITIES:  No pitting pedal edema.  No calf induration or tenderness was  noted.  MUSCULOSKELETAL:  Chest wall pressure was placed but the pain was not  reproducible.  CNS EXAM:  Was grossly intact with no focal deficits.   LABORATORY DATA:  Chest x-ray was negative for any acute cardiopulmonary  disease.  A 12-lead EKG was normal sinus rhythm with right bundle branch  block, poor R wave progression.   White blood cell count 5.1, hemoglobin 11.5, hematocrit 32,  platelet count  367,000.  There was no left shift.  Sodium 137, potassium 4, chloride 102,  carbon dioxide 25, glucose 112, BUN 7, creatinine 1, calcium 8.8, cardiac  enzymes x2 were negative.  PT 13.4, INR 1.   ASSESSMENT AND PLAN:  Mr. Pech is a 50 year old male who presents with  fairly typical angina pain which appeared to resolve with nitroglycerin.  The onset of pain was at the time of exertional activity.  While the patient  has no risk factors for heart disease other than probable uncontrolled  hypertension his pain is typical that I am concerned that there may be some  cardiac component.  The patient does not have any acid reflux and does not  describe such symptoms of gastroesophageal spasm.  He was not lifting any  heavy objects at the time.   The plan is to admit at this time on telemetry, rule out with cardiac  enzymes x2.  We will get a fasting lipid profile and repeat kidney function,  basic metabolic profile in the morning.  We will check a fasting lipid  profile on him.   We will request cardiology to see him in consult.  I have discussed the  above plan with the patient who verbalized full understanding.      Margaretmary Dys, M.D.  Electronically Signed     AM/MEDQ  D:  09/20/2005  T:  09/20/2005  Job:  [id number]

## 2017-12-28 ENCOUNTER — Other Ambulatory Visit: Payer: Self-pay

## 2017-12-28 ENCOUNTER — Encounter (HOSPITAL_COMMUNITY): Payer: Self-pay | Admitting: Emergency Medicine

## 2017-12-28 ENCOUNTER — Emergency Department (HOSPITAL_COMMUNITY): Payer: Self-pay

## 2017-12-28 ENCOUNTER — Emergency Department (HOSPITAL_COMMUNITY)
Admission: EM | Admit: 2017-12-28 | Discharge: 2017-12-28 | Disposition: A | Discharge status: Home / Self Care | Payer: Self-pay | Attending: Emergency Medicine | Admitting: Emergency Medicine

## 2017-12-28 DIAGNOSIS — Y939 Activity, unspecified: Secondary | ICD-10-CM | POA: Insufficient documentation

## 2017-12-28 DIAGNOSIS — S61451A Open bite of right hand, initial encounter: Secondary | ICD-10-CM | POA: Insufficient documentation

## 2017-12-28 DIAGNOSIS — Y929 Unspecified place or not applicable: Secondary | ICD-10-CM | POA: Insufficient documentation

## 2017-12-28 DIAGNOSIS — W540XXD Bitten by dog, subsequent encounter: Secondary | ICD-10-CM

## 2017-12-28 DIAGNOSIS — L03113 Cellulitis of right upper limb: Secondary | ICD-10-CM | POA: Insufficient documentation

## 2017-12-28 DIAGNOSIS — W540XXA Bitten by dog, initial encounter: Secondary | ICD-10-CM | POA: Insufficient documentation

## 2017-12-28 DIAGNOSIS — Y999 Unspecified external cause status: Secondary | ICD-10-CM | POA: Insufficient documentation

## 2017-12-28 LAB — BASIC METABOLIC PANEL
Anion gap: 9 (ref 5–15)
BUN: 6 mg/dL (ref 6–20)
CO2: 28 mmol/L (ref 22–32)
Calcium: 9.5 mg/dL (ref 8.9–10.3)
Chloride: 97 mmol/L — ABNORMAL LOW (ref 101–111)
Creatinine, Ser: 0.82 mg/dL (ref 0.61–1.24)
GFR calc Af Amer: >60 mL/min (ref >60)
GFR calc non Af Amer: >60 mL/min (ref >60)
Glucose, Bld: 229 mg/dL — ABNORMAL HIGH (ref 65–99)
Potassium: 4.6 mmol/L (ref 3.5–5.1)
Sodium: 134 mmol/L — ABNORMAL LOW (ref 135–145)

## 2017-12-28 LAB — CBC WITH DIFFERENTIAL/PLATELET
Basophils Absolute: 0 10*3/uL (ref 0.0–0.1)
Basophils Relative: 1 %
Eosinophils Absolute: 0.1 10*3/uL (ref 0.0–0.7)
Eosinophils Relative: 1 %
HCT: 39 % (ref 39.0–52.0)
Hemoglobin: 13 g/dL (ref 13.0–17.0)
Lymphocytes Relative: 23 %
Lymphs Abs: 1.4 10*3/uL (ref 0.7–4.0)
MCH: 33.6 pg (ref 26.0–34.0)
MCHC: 33.3 g/dL (ref 30.0–36.0)
MCV: 100.8 fL — ABNORMAL HIGH (ref 78.0–100.0)
Monocytes Absolute: 0.4 10*3/uL (ref 0.1–1.0)
Monocytes Relative: 7 %
Neutro Abs: 4.2 10*3/uL (ref 1.7–7.7)
Neutrophils Relative %: 68 %
Platelets: 282 10*3/uL (ref 150–400)
RBC: 3.87 MIL/uL — ABNORMAL LOW (ref 4.22–5.81)
RDW: 11.7 % (ref 11.5–15.5)
WBC: 6.1 10*3/uL (ref 4.0–10.5)

## 2017-12-28 MED ORDER — AMOXICILLIN-POT CLAVULANATE 875-125 MG PO TABS: 1.0000 | ORAL_TABLET | Freq: Two times a day (BID) | ORAL | 0 refills | Status: DC

## 2017-12-28 MED ORDER — SODIUM CHLORIDE 0.9 % IV SOLN
3.0000 g | Freq: Once | INTRAVENOUS | Status: AC
  Administered 2017-12-28: 3 g via INTRAVENOUS
  Filled 2017-12-28: qty 3

## 2017-12-28 NOTE — Discharge Instructions (Signed)
Return here tomorrow for recheck.  You must take antibiotic.  Leave wounds open.  Soak area 20 minutes 4 times a day

## 2017-12-28 NOTE — ED Triage Notes (Signed)
Patient with animal bites that occurred on Sunday night. Patient was seen at [facility] and given Tdap. No xrays done. Patient with swelling to his L hand. Wound to inner upper R thigh with redness.

## 2017-12-28 NOTE — ED Notes (Signed)
Pt alert & oriented x4, stable gait. Patient given discharge instructions, paperwork & prescription(s). Patient  instructed to stop at the registration desk to finish any additional paperwork. Patient verbalized understanding. Pt left department w/ no further questions. 

## 2017-12-29 NOTE — ED Provider Notes (Signed)
[facility] EMERGENCY DEPARTMENT Provider Note   CSN: [id number] Arrival date & time: 12/28/17  5277     History   Chief Complaint Chief Complaint  Patient presents with  . Animal Bite    HPI [name] is a 57 y.o. male.  The history is provided by the patient. No language interpreter was used.  Animal Bite  Contact animal:  Dog Location:  Hand Hand injury location:  L hand and L wrist Pain details:    Quality:  Aching   Severity:  Moderate   Progression:  Worsening Incident location:  Home Provoked: provoked   Animal in possession: yes   Tetanus status:  Up to date Worsened by:  Nothing Ineffective treatments:  None tried Associated symptoms: swelling   Pt reports he was bitten by his dog.  Pt reports he was seen Sunday at [facility].  Pt reports he was given one dose of antibiotics.  Pt states he did not get rx for antibiotics.  Pt reports wounds were left open so his girlfriend closed them with steristrips,  Pt complains of redness and swelling around wounds.  Pt is concerned something is broken.   History reviewed. No pertinent past medical history.  Patient Active Problem List   Diagnosis Date Noted  . ANEMIA 03/10/2009    Past Surgical History:  Procedure Laterality Date  . KNEE SURGERY         Home Medications    Prior to Admission medications   Medication Sig Start Date End Date Taking? Authorizing Provider  amoxicillin-clavulanate (AUGMENTIN) 875-125 MG tablet Take 1 tablet by mouth every 12 (twelve) hours. 12/28/17   Fransico Meadow, PA-C    Family History Family History  Problem Relation Age of Onset  . Cancer Mother   . Cancer Father   . Cancer Other   . Diabetes Other     Social History Social History   Tobacco Use  . Smoking status: Never Smoker  . Smokeless tobacco: Never Used  Substance Use Topics  . Alcohol use: Yes    Alcohol/week: 4.2 oz    Types: 7 Cans of beer per week    Comment: beer  . Drug use: No      Allergies   Aspirin   Review of Systems Review of Systems  All other systems reviewed and are negative.    Physical Exam Updated Vital Signs BP (!) 167/101 (BP Location: Right Arm)   Pulse 91   Temp 98.3 F (36.8 C) (Oral)   Resp 16   Ht 6\' 1"  (1.854 m)   Wt 81.6 kg (180 lb)   SpO2 97%   BMI 23.75 kg/m   Physical Exam  Constitutional: He is oriented to person, place, and time. He appears well-developed and well-nourished.  HENT:  Head: Normocephalic.  Eyes: Pupils are equal, round, and reactive to light.  Cardiovascular: Normal rate.  Pulmonary/Chest: Effort normal.  Musculoskeletal: He exhibits tenderness.  Swollen right hand,  Multiple puncture wounds,  steristips across wounds.   Neurological: He is alert and oriented to person, place, and time.  Skin: Skin is warm.  Psychiatric: He has a normal mood and affect.  Vitals reviewed.    ED Treatments / Results  Labs (all labs ordered are listed, but only abnormal results are displayed) Labs Reviewed  CBC WITH DIFFERENTIAL/PLATELET - Abnormal; Notable for the following components:      Result Value   RBC 3.87 (*)    MCV 100.8 (*)    All  other components within normal limits  BASIC METABOLIC PANEL - Abnormal; Notable for the following components:   Sodium 134 (*)    Chloride 97 (*)    Glucose, Bld 229 (*)    All other components within normal limits    EKG  EKG Interpretation None       Radiology Dg Hand Complete Left  Result Date: 12/28/2017 CLINICAL DATA:  LEFT hand pain, swelling and multiple wounds from dog bite to the ulnar side of the LEFT hand 4 days ago EXAM: LEFT HAND - COMPLETE 3+ VIEW COMPARISON:  02/26/2006 FINDINGS: Soft tissue swelling. Osseous mineralization normal. Minimal degenerative changes at DIP joint of the little finger. Remain joint spaces preserved. Old non fused ossicle at tip of ulnar styloid. No acute fracture, dislocation, or bone destruction. No soft tissue gas  identified. IMPRESSION: Soft tissue swelling without acute bony abnormalities. Electronically Signed   By: Lavonia Dana M.D.   On: 12/28/2017 19:11    Procedures Procedures (including critical care time)  Medications Ordered in ED Medications  Ampicillin-Sulbactam (UNASYN) 3 g in sodium chloride 0.9 % 100 mL IVPB (0 g Intravenous Stopped 12/28/17 1959)     Initial Impression / Assessment and Plan / ED Course  I have reviewed the triage vital signs and the nursing notes.  Pertinent labs & imaging results that were available during my care of the patient were reviewed by me and considered in my medical decision making (see chart for details).     MDM:  I removed steristrips,  Pt given Iv Unasyn.  Labs reviewed no elevated WBC's.  Pt's paper work from [employer] reviewed.  Pt did receive rx for augmentin but he did not fill.  I advised pt to leave wounds open  Warm compresses/Soaks 20 minutes every hour.  Pt given rx for augmentin.  Pt is advised to recheck here tomorrow.   Final Clinical Impressions(s) / ED Diagnoses   Final diagnoses:  Cellulitis of right upper extremity  Dog bite, subsequent encounter    ED Discharge Orders        Ordered    amoxicillin-clavulanate (AUGMENTIN) 875-125 MG tablet  Every 12 hours     12/28/17 2031    An After Visit Summary was printed and given to the patient.    Fransico Meadow, PA-C 12/29/17 0013    Francine Graven, DO 12/31/17 1650

## 2019-06-01 ENCOUNTER — Telehealth: Payer: Self-pay

## 2019-06-06 ENCOUNTER — Encounter: Payer: Self-pay | Admitting: Physician Assistant

## 2019-06-06 ENCOUNTER — Ambulatory Visit: Payer: Self-pay | Admitting: Physician Assistant

## 2019-06-06 VITALS — BP 144/90 | HR 91 | Temp 98.1°F | Wt 175.6 lb

## 2019-06-06 DIAGNOSIS — Z131 Encounter for screening for diabetes mellitus: Secondary | ICD-10-CM

## 2019-06-06 DIAGNOSIS — Z125 Encounter for screening for malignant neoplasm of prostate: Secondary | ICD-10-CM

## 2019-06-06 DIAGNOSIS — Z833 Family history of diabetes mellitus: Secondary | ICD-10-CM

## 2019-06-06 DIAGNOSIS — R2231 Localized swelling, mass and lump, right upper limb: Secondary | ICD-10-CM

## 2019-06-06 DIAGNOSIS — R030 Elevated blood-pressure reading, without diagnosis of hypertension: Secondary | ICD-10-CM

## 2019-06-06 DIAGNOSIS — Z7689 Persons encountering health services in other specified circumstances: Secondary | ICD-10-CM

## 2019-06-06 DIAGNOSIS — Z8639 Personal history of other endocrine, nutritional and metabolic disease: Secondary | ICD-10-CM

## 2019-06-06 NOTE — Progress Notes (Signed)
BP (!) 144/90   Pulse 91   Temp 98.1 F (36.7 C)   Wt 175 lb 9.6 oz (79.7 kg)   SpO2 95%   BMI 23.17 kg/m    Subjective:    Patient ID: [name], male    DOB: 1960-12-13, 58 y.o.   MRN: [id number]  HPI: [name] is a 58 y.o. male presenting on 06/06/2019 for New Patient (Initial Visit)   HPI    Chief Complaint  Patient presents with  . New Patient (Initial Visit)    Pt had a negative Covid 19 screening questionaire   Pt says he has on his R forearm a mass that started 3 or 4 years ago and is  Getting bigger.   He says about 6 months ago he started getting a mass R axilla.  He says both seem to be getting larger.    He says he didn't seek treatment previously because he was afraid it might be cancer   Pt says he has long history of problems with his shoulders.   He has been seen for this previously by dr Arnoldo Morale (neurosurgeon [place])  Then by dr case in [place].  Pt only brings this up as provider leaving exam room to end appointment.  [facility] [place] [facility].    He has history of pancreatitis and colonoscopy- both in  2009.  He continues to drink 6 pack/beer every other day  Pt states previously Went to pcp in 2016 but then he [employer]    Of note, pt Works at [employer] for past 7-8 years     Relevant past medical, surgical, family and social history reviewed and updated as indicated. Interim medical history since our last visit reviewed. Allergies and medications reviewed and updated.  CURRENT MEDS:  None  No current outpatient medications on file.     Review of Systems  Per HPI unless specifically indicated above     Objective:    BP (!) 144/90   Pulse 91   Temp 98.1 F (36.7 C)   Wt 175 lb 9.6 oz (79.7 kg)   SpO2 95%   BMI 23.17 kg/m   Wt Readings from Last 3 Encounters:  06/06/19 175 lb 9.6 oz (79.7 kg)  12/28/17 180 lb (81.6 kg)    Physical Exam Vitals signs and nursing note reviewed.   Constitutional:      General: He is not in acute distress.    Appearance: Normal appearance. He is well-developed and normal weight. He is not ill-appearing.  HENT:     Head: Normocephalic and atraumatic.  Eyes:     Conjunctiva/sclera: Conjunctivae normal.     Pupils: Pupils are equal, round, and reactive to light.  Neck:     Musculoskeletal: Neck supple.     Thyroid: No thyromegaly.  Cardiovascular:     Rate and Rhythm: Normal rate and regular rhythm.  Pulmonary:     Effort: Pulmonary effort is normal.     Breath sounds: Normal breath sounds. No wheezing or rales.  Chest:     Comments: Large approximately 1 inch diameter mass/lymph node in the lower portion of the  R axilla.  It is hard, nontender, not mobile.  Abdominal:     General: Bowel sounds are normal.     Palpations: Abdomen is soft. There is no mass.     Tenderness: There is no abdominal tenderness.  Musculoskeletal:     Right lower leg: No  edema.     Left lower leg: No edema.     Comments: Right forearm with large mass-  About 6 cm x 3cm x 2 cm.  It is nontender.  No redness.  Mass pictured below  Lymphadenopathy:     Cervical: No cervical adenopathy.     Upper Body:     Right upper body: Axillary adenopathy present.  Skin:    General: Skin is warm and dry.     Findings: No rash.  Neurological:     Mental Status: He is alert and oriented to person, place, and time.  Psychiatric:        Behavior: Behavior normal.        Thought Content: Thought content normal.             Assessment & Plan:   Encounter Diagnoses  Name Primary?  . Encounter to establish care Yes  . Arm mass, right   . History of hyperlipidemia   . Elevated blood pressure reading   . Mass of right axilla   . Family history of diabetes mellitus   . Screening for diabetes mellitus   . Screening for prostate cancer       -Refer to surgeon for further evaluation and treatment of mass right forearm -Will get ultrasound R axilla  -discussed with pt this could possibly be something infectious (?something unusual from the [employer]?) with drainage to the lymph node or could be cancerous.    -will get Fasting labs   -Pt was given application for [facility] and encouraged to get it submitted in a timely manner  -discussed medication for BP but pt thinks it may be up some today due to nervous and he would like to recheck it in one month to see if it is improved   -counseled pt to limit etoh to no more than 1 or 2 daily  -pt encouraged to continue wearing a mask whenever he is out in public  -will follow up with pt in 1 month to review labs, recheck  Bp.  Pt to contact office sooner prn

## 2019-06-07 ENCOUNTER — Other Ambulatory Visit (HOSPITAL_COMMUNITY)
Admission: RE | Admit: 2019-06-07 | Discharge: 2019-06-07 | Disposition: A | Discharge status: Home / Self Care | Payer: Self-pay | Source: Ambulatory Visit | Attending: Physician Assistant | Admitting: Physician Assistant

## 2019-06-07 ENCOUNTER — Other Ambulatory Visit: Payer: Self-pay | Admitting: Student

## 2019-06-07 DIAGNOSIS — R030 Elevated blood-pressure reading, without diagnosis of hypertension: Secondary | ICD-10-CM | POA: Insufficient documentation

## 2019-06-07 DIAGNOSIS — R2231 Localized swelling, mass and lump, right upper limb: Secondary | ICD-10-CM

## 2019-06-07 DIAGNOSIS — Z131 Encounter for screening for diabetes mellitus: Secondary | ICD-10-CM | POA: Insufficient documentation

## 2019-06-07 DIAGNOSIS — Z125 Encounter for screening for malignant neoplasm of prostate: Secondary | ICD-10-CM | POA: Insufficient documentation

## 2019-06-07 DIAGNOSIS — Z8639 Personal history of other endocrine, nutritional and metabolic disease: Secondary | ICD-10-CM | POA: Insufficient documentation

## 2019-06-07 LAB — COMPREHENSIVE METABOLIC PANEL
ALT: 12 U/L (ref 0–44)
AST: 26 U/L (ref 15–41)
Albumin: 3.7 g/dL (ref 3.5–5.0)
Alkaline Phosphatase: 58 U/L (ref 38–126)
Anion gap: 10 (ref 5–15)
BUN: 10 mg/dL (ref 6–20)
CO2: 26 mmol/L (ref 22–32)
Calcium: 9.7 mg/dL (ref 8.9–10.3)
Chloride: 96 mmol/L — ABNORMAL LOW (ref 98–111)
Creatinine, Ser: 0.89 mg/dL (ref 0.61–1.24)
GFR calc Af Amer: >60 mL/min (ref >60)
GFR calc non Af Amer: >60 mL/min (ref >60)
Glucose, Bld: 138 mg/dL — ABNORMAL HIGH (ref 70–99)
Potassium: 4.8 mmol/L (ref 3.5–5.1)
Sodium: 132 mmol/L — ABNORMAL LOW (ref 135–145)
Total Bilirubin: 1 mg/dL (ref 0.3–1.2)
Total Protein: 8.4 g/dL — ABNORMAL HIGH (ref 6.5–8.1)

## 2019-06-07 LAB — PSA: Prostatic Specific Antigen: 1.45 ng/mL (ref 0.00–4.00)

## 2019-06-07 LAB — LIPID PANEL
Cholesterol: 241 mg/dL — ABNORMAL HIGH (ref 0–200)
HDL: 78 mg/dL (ref >40)
LDL Cholesterol: 148 mg/dL — ABNORMAL HIGH (ref 0–99)
Total CHOL/HDL Ratio: 3.1 RATIO
Triglycerides: 73 mg/dL (ref <150)
VLDL: 15 mg/dL (ref 0–40)

## 2019-06-07 LAB — CBC WITH DIFFERENTIAL/PLATELET
Abs Immature Granulocytes: 0.01 10*3/uL (ref 0.00–0.07)
Basophils Absolute: 0.1 10*3/uL (ref 0.0–0.1)
Basophils Relative: 1 %
Eosinophils Absolute: 0.1 10*3/uL (ref 0.0–0.5)
Eosinophils Relative: 3 %
HCT: 43.7 % (ref 39.0–52.0)
Hemoglobin: 14.3 g/dL (ref 13.0–17.0)
Immature Granulocytes: 0 %
Lymphocytes Relative: 28 %
Lymphs Abs: 1.4 10*3/uL (ref 0.7–4.0)
MCH: 34.3 pg — ABNORMAL HIGH (ref 26.0–34.0)
MCHC: 32.7 g/dL (ref 30.0–36.0)
MCV: 104.8 fL — ABNORMAL HIGH (ref 80.0–100.0)
Monocytes Absolute: 0.5 10*3/uL (ref 0.1–1.0)
Monocytes Relative: 11 %
Neutro Abs: 2.9 10*3/uL (ref 1.7–7.7)
Neutrophils Relative %: 57 %
Platelets: 256 10*3/uL (ref 150–400)
RBC: 4.17 MIL/uL — ABNORMAL LOW (ref 4.22–5.81)
RDW: 11.4 % — ABNORMAL LOW (ref 11.5–15.5)
WBC: 5 10*3/uL (ref 4.0–10.5)
nRBC: 0 % (ref 0.0–0.2)

## 2019-06-07 LAB — HEMOGLOBIN A1C
Hgb A1c MFr Bld: 6.2 % — ABNORMAL HIGH (ref 4.8–5.6)
Mean Plasma Glucose: 131.24 mg/dL

## 2019-06-11 ENCOUNTER — Ambulatory Visit (HOSPITAL_COMMUNITY)
Admission: RE | Admit: 2019-06-11 | Discharge: 2019-06-11 | Disposition: A | Discharge status: Home / Self Care | Payer: Self-pay | Source: Ambulatory Visit | Attending: Physician Assistant | Admitting: Physician Assistant

## 2019-06-11 ENCOUNTER — Other Ambulatory Visit: Payer: Self-pay

## 2019-06-11 DIAGNOSIS — R2231 Localized swelling, mass and lump, right upper limb: Secondary | ICD-10-CM | POA: Insufficient documentation

## 2019-06-12 ENCOUNTER — Ambulatory Visit (INDEPENDENT_AMBULATORY_CARE_PROVIDER_SITE_OTHER): Payer: Self-pay | Admitting: Surgery

## 2019-06-12 ENCOUNTER — Encounter: Payer: Self-pay | Admitting: Surgery

## 2019-06-12 ENCOUNTER — Ambulatory Visit: Payer: Self-pay | Admitting: Surgery

## 2019-06-12 VITALS — BP 182/102 | HR 100 | Temp 97.5°F | Ht 72.0 in | Wt 172.0 lb

## 2019-06-12 DIAGNOSIS — R2231 Localized swelling, mass and lump, right upper limb: Secondary | ICD-10-CM

## 2019-06-12 NOTE — Progress Notes (Signed)
06/12/2019  Reason for Visit:  Right forearm mass, right axillary mass  Referring Provider:  Soyla Dryer, PA-C  History of Present Illness: [name] is a 58 y.o. male presenting for evaluation of a right forearm mass.  He reports having this mass for probably 5 years, which started very small, and has grown with time.  It has grown enough that the skin over it is becoming tighter and the mass is becoming uncomfortable.  He reports that sometimes he will feel weakness of his right hand.  He is right-handed.  He also reports feeling two other smaller masses in his right forearm and arm.  He feels his larger mass started similar in size to the other two.  He also has noticed a large mass in his right axilla.  His PCP ordered an ultrasound of the axilla, which shows a 3.1 x 3.9 x 2.5 cm heterogeneous mass, concerning for possible neoplasm.  He did not seek any medical attention for these because he was afraid that it would be cancer.  Past Medical History: Past Medical History:  Diagnosis Date  . Pancreatitis 2009   Hypertension    Hyperlipidemia   . Panic attacks      Past Surgical History: Past Surgical History:  Procedure Laterality Date  . COLONOSCOPY  2009  . KNEE SURGERY Left 1980    Home Medications: Prior to Admission medications   None    Allergies: Allergies  Allergen Reactions  . Bee Venom Anaphylaxis  . Aspirin Other (See Comments)    Stomach pain    Social History:  reports that he has never smoked. He has never used smokeless tobacco. He reports current alcohol use of about 25.0 standard drinks of alcohol per week. He reports current drug use. Drug: Marijuana.   Family History: Family History  Problem Relation Age of Onset  . Cancer Mother        intestinal cancer  . Diabetes Father   . Kidney disease Father   . Cancer Father        lung cancer  . Diabetes Paternal Aunt     Review of Systems: Review of Systems  Constitutional: Negative for  chills and fever.  HENT: Negative for hearing loss.   Eyes: Negative for blurred vision.  Respiratory: Negative for shortness of breath.   Cardiovascular: Negative for chest pain.  Gastrointestinal: Negative for abdominal pain, nausea and vomiting.  Genitourinary: Negative for dysuria.  Musculoskeletal: Negative for myalgias.  Neurological: Positive for weakness (right hand).  Psychiatric/Behavioral: Negative for depression.    Physical Exam BP (!) 182/102   Pulse 100   Temp (!) 97.5 F (36.4 C) (Skin)   Ht 6' (1.829 m)   Wt 172 lb (78 kg)   SpO2 98%   BMI 23.33 kg/m  CONSTITUTIONAL: No acute distress HEENT:  Normocephalic, atraumatic, extraocular motion intact. NECK: Trachea is midline, and there is no jugular venous distension.  RESPIRATORY:  Lungs are clear, and breath sounds are equal bilaterally. Normal respiratory effort without pathologic use of accessory muscles. CARDIOVASCULAR: Heart is regular without murmurs, gallops, or rubs. BREAST:  Bilateral breast/chest exam was negative for any palpable masses, nipple changes, drainage, or skin changes. GI: The abdomen is soft, non-distended, non-tender to palpation. There were no palpable masses.  MUSCULOSKELETAL:  Right mid-forearm has a 6 x 3 cm mass, which is mobile but hard, with some tenderness to palpation.  There is no palpable pulse or thrill.  There is no ulceration of the skin,  though the skin is somewhat dry over the mass.  More proximally, about 3 cm distal to the elbow, there is a smaller, 2 cm mass which is also mobile, non-tender, without any skin changes.  At the distal arm, about 8 cm proximal to the elbow, there is smaller 1 cm mass which is also mobile, non-tender, without any skin changes.  Left arm and forearm exam is negative. LYMPH:  Right axilla has a 3-4 cm mass which is not mobile, not tender, without any drainage or skin changes.  Left axilla negative.  No supraclavicular lymphadenopathy.  NEUROLOGIC:  Motor  and sensation is grossly normal.  Cranial nerves are grossly intact. PSYCH:  Alert and oriented to person, place and time. Affect is normal.  Laboratory Analysis: No results found for this or any previous visit (from the past 24 hour(s)).  Imaging: U/S Right axilla 06/11/19: FINDINGS: There is a large, heterogeneous solid mass in the right axilla, corresponding with the patient's palpable concern. This measures approximately 3.1 x 3.9 x 2.5 cm and demonstrates internal blood flow with color Doppler. There is an adjacent similar lesion measuring approximately 2.8 cm, probably a lymph node.  IMPRESSION: 1. The patient's palpable concerns in the right axilla are solid with internal blood flow. These are likely neoplastic and could reflect metastatic adenopathy, lymphoma or primary soft tissue malignancy. 2. Correlation with physical examination to exclude a right breast mass recommended. Consider chest CT for further evaluation prior to tissue sampling.  Assessment and Plan: This is a 58 y.o. male presenting with multiple masses of the right upper extremity and axilla.    Discussed with the patient that there is high suspicion for malignancy, though we could not be sure 100% until further workup is done.  At this point, he has a large hard mid-forearm mass, a smaller proximal forearm mass, and a smaller distal arm mass, with also a large mass in the right axilla.  There are no findings on the left arm.  Discussed with the patient that we would order CT scan of the chest as recommended by radiology as well as CT scan of the right upper extremity to further identify these masses, and any other possible masses.  Based on these imaging findings, I think the next step would be obtaining biopsy of the masses and then referrals for appropriate treatment based on diagnosis.  CT scans have been ordered for 7/23 and he will follow up with me on 7/24 to discuss the results and further plans.  He is in  agreement with this plan, and all of his questions have been answered.  Face-to-face time spent with the patient and care providers was 60 minutes, with more than 50% of the time spent counseling, educating, and coordinating care of the patient.     Melvyn Neth, [facility]

## 2019-06-12 NOTE — Patient Instructions (Addendum)
06/14/2019 Outpatient Imaging @ 2:30 pm . Only liquids 4 hours prior.

## 2019-06-14 ENCOUNTER — Ambulatory Visit
Admission: RE | Admit: 2019-06-14 | Discharge: 2019-06-14 | Disposition: A | Discharge status: Home / Self Care | Payer: Self-pay | Source: Ambulatory Visit | Attending: Surgery | Admitting: Surgery

## 2019-06-14 ENCOUNTER — Other Ambulatory Visit: Payer: Self-pay

## 2019-06-14 DIAGNOSIS — R2231 Localized swelling, mass and lump, right upper limb: Secondary | ICD-10-CM | POA: Insufficient documentation

## 2019-06-14 MED ORDER — IOHEXOL 300 MG/ML  SOLN
75.0000 mL | Freq: Once | INTRAMUSCULAR | Status: AC | PRN
  Administered 2019-06-14: 100 mL via INTRAVENOUS

## 2019-06-15 ENCOUNTER — Ambulatory Visit (INDEPENDENT_AMBULATORY_CARE_PROVIDER_SITE_OTHER): Payer: Self-pay | Admitting: Surgery

## 2019-06-15 ENCOUNTER — Other Ambulatory Visit: Payer: Self-pay

## 2019-06-15 ENCOUNTER — Encounter: Payer: Self-pay | Admitting: Surgery

## 2019-06-15 VITALS — BP 157/94 | HR 90 | Temp 97.7°F | Resp 16 | Ht 73.0 in | Wt 175.4 lb

## 2019-06-15 DIAGNOSIS — C499 Malignant neoplasm of connective and soft tissue, unspecified: Secondary | ICD-10-CM

## 2019-06-15 NOTE — Patient Instructions (Addendum)
The Urgent referral to [facility] Dr. Junie Spencer. Someone should call you within a few days. If you do not hear from their office please call us back and let us know.   Dr.Robert [phone].  Please call with any questions or concerns.

## 2019-06-18 ENCOUNTER — Telehealth: Payer: Self-pay

## 2019-06-18 ENCOUNTER — Encounter: Payer: Self-pay | Admitting: Surgery

## 2019-06-18 NOTE — Telephone Encounter (Signed)
Left message letting patient know I have faxed office notes to Dr.Robert esther [facility].

## 2019-06-18 NOTE — Progress Notes (Signed)
HPI 58 year old male with soft tissue masses to include a right forearm 4 cm mass and an axillary mass.  He was seen by Dr. Hampton Abbot.  A CT scan has been ordered and I have personally reviewed showing evidence of 5 cm soft tissue mass in the right forearm concerning for sarcoma or lymphoma.  There is also lymphadenopathy on the right axilla.  PE NAD , alert Chest CTA, NSR, s1,s2 Axilla: there is bulky abnormal lymphadenopathy on the right axilla. Ext: There is a 3-1/2 x 5 cm soft tissue mass on the right forearm concerning for sarcoma.  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  At a tertiary facility where they specialize in soft tissue cancers.  Discussed with patient in detail about his options and I do think that given that they have specialized pathologies that can make a better diagnosis I do think that is his best interest to go ahead and have a biopsy and evaluation by a soft tissue specialist at [facility].  He wishes to go to [facility].  Please note that we will arrange for this referral.  Please note that I spent over 25 minutes in this encounter with greater than 50% spent in coordination and counseling of his care

## 2019-06-19 ENCOUNTER — Telehealth: Payer: Self-pay | Admitting: Student

## 2019-06-19 ENCOUNTER — Other Ambulatory Visit: Payer: Self-pay | Admitting: Physician Assistant

## 2019-06-19 MED ORDER — LISINOPRIL 10 MG PO TABS: 10.0000 mg | ORAL_TABLET | Freq: Every day | ORAL | 1 refills | Status: DC

## 2019-06-19 NOTE — Telephone Encounter (Signed)
LPN called pt to notify him that PA would like to put him on lisinopril for his bp as it has been elevated at his appointments with the specialist as well as when he was seen here for his New Patient visit. Pt verbalized understanding and requests med to be sent to [facility] in [place].  Rx lisinopril sent to [facility] in [place] on 06-19-19.

## 2019-06-21 ENCOUNTER — Telehealth: Payer: Self-pay | Admitting: *Deleted

## 2019-06-21 NOTE — Telephone Encounter (Signed)
Dr. [name] Herrlich office called today to report they have scheduled patient for an appointment for tomorrow, 06-22-19 at 9:45 am.   They are requesting recent imaging to be pushed through to [name].   I did call [facility] and spoke with Pam today. She will push over the images from the 06-11-19 soft tissue ultrasound and the 06-14-19 images from chest CT and right upper extremity CT scan.

## 2019-06-22 ENCOUNTER — Ambulatory Visit: Admit: 2019-06-22 | Discharge: 2019-06-23

## 2019-06-22 DIAGNOSIS — R2231 Localized swelling, mass and lump, right upper limb: Principal | ICD-10-CM

## 2019-06-22 DIAGNOSIS — C50911 Malignant neoplasm of unspecified site of right female breast: Secondary | ICD-10-CM

## 2019-06-22 DIAGNOSIS — C773 Secondary and unspecified malignant neoplasm of axilla and upper limb lymph nodes: Secondary | ICD-10-CM

## 2019-06-22 DIAGNOSIS — C4911 Malignant neoplasm of connective and soft tissue of right upper limb, including shoulder: Principal | ICD-10-CM

## 2019-06-23 ENCOUNTER — Emergency Department (HOSPITAL_COMMUNITY): Payer: Self-pay

## 2019-06-23 ENCOUNTER — Encounter (HOSPITAL_COMMUNITY): Payer: Self-pay | Admitting: Emergency Medicine

## 2019-06-23 ENCOUNTER — Other Ambulatory Visit: Payer: Self-pay

## 2019-06-23 ENCOUNTER — Emergency Department (HOSPITAL_COMMUNITY)
Admission: EM | Admit: 2019-06-23 | Discharge: 2019-06-23 | Disposition: A | Discharge status: Home / Self Care | Payer: Self-pay | Attending: Emergency Medicine | Admitting: Emergency Medicine

## 2019-06-23 DIAGNOSIS — Z79899 Other long term (current) drug therapy: Secondary | ICD-10-CM | POA: Insufficient documentation

## 2019-06-23 DIAGNOSIS — K047 Periapical abscess without sinus: Secondary | ICD-10-CM | POA: Insufficient documentation

## 2019-06-23 DIAGNOSIS — Z859 Personal history of malignant neoplasm, unspecified: Secondary | ICD-10-CM | POA: Insufficient documentation

## 2019-06-23 HISTORY — DX: Malignant (primary) neoplasm, unspecified: C80.1

## 2019-06-23 LAB — I-STAT CREATININE, ED: Creatinine, Ser: 0.7 mg/dL (ref 0.61–1.24)

## 2019-06-23 MED ORDER — CHLORHEXIDINE GLUCONATE 0.12% ORAL RINSE (MEDLINE KIT): 15.0000 mL | Freq: Two times a day (BID) | OROMUCOSAL | 0 refills | Status: AC

## 2019-06-23 MED ORDER — ACETAMINOPHEN 325 MG PO TABS
650.0000 mg | ORAL_TABLET | Freq: Once | ORAL | Status: AC
  Administered 2019-06-23: 15:00:00 650 mg via ORAL
  Filled 2019-06-23: qty 2

## 2019-06-23 MED ORDER — AMOXICILLIN-POT CLAVULANATE 875-125 MG PO TABS: 1.0000 | ORAL_TABLET | Freq: Two times a day (BID) | ORAL | 0 refills | Status: AC

## 2019-06-23 MED ORDER — IOHEXOL 300 MG/ML  SOLN
75.0000 mL | Freq: Once | INTRAMUSCULAR | Status: AC | PRN
  Administered 2019-06-23: 16:00:00 75 mL via INTRAVENOUS

## 2019-06-23 MED ORDER — LIDOCAINE HCL (PF) 1 % IJ SOLN
INTRAMUSCULAR | Status: AC
  Filled 2019-06-23: qty 2

## 2019-06-23 MED ORDER — HYDROCODONE-ACETAMINOPHEN 5-325 MG PO TABS
1.0000 | ORAL_TABLET | Freq: Once | ORAL | Status: AC
  Administered 2019-06-23: 16:00:00 1 via ORAL
  Filled 2019-06-23: qty 1

## 2019-06-23 MED ORDER — AMOXICILLIN-POT CLAVULANATE 875-125 MG PO TABS: 1.0000 | ORAL_TABLET | Freq: Two times a day (BID) | ORAL | 0 refills | Status: DC

## 2019-06-23 NOTE — Discharge Instructions (Signed)
You were given a prescription for antibiotics. Please take the antibiotic prescription fully.   Please follow-up with a dentist in the next 5 to 7 days for reevaluation.  If you do not have a dentist, resources were provided for dentist in the area in your discharge summary.  Please contact one of the offices that are listed and make an appointment for follow-up.  Please return to the emergency department for any new or worsening symptoms.  

## 2019-06-23 NOTE — ED Provider Notes (Addendum)
[facility] EMERGENCY DEPARTMENT Provider Note   CSN: [id number] Arrival date & time: 06/23/19  1342    History   Chief Complaint Chief Complaint  Patient presents with  . Abscess    HPI [name] is a 58 y.o. male.     HPI   Pt is a 58 y/o male with a h/o CA, pancreatitis, who presents to the ED today for eval of possible dental abscess. States that yesterday he noticed a small bump to his gum line and today he experienced increased swelling to that area. He now has increased facial swelling and pain to the right lower mouth. Denies fevers. States heis currently being worked up for soft tissue/connective tissue neoplasm at [facility].   Past Medical History:  Diagnosis Date  . Cancer ([place])   . Pancreatitis 2009  . Panic attacks     Patient Active Problem List   Diagnosis Date Noted  . ANEMIA 03/10/2009    Past Surgical History:  Procedure Laterality Date  . COLONOSCOPY  2009  . KNEE SURGERY Left 1980        Home Medications    Prior to Admission medications   Medication Sig Start Date End Date Taking? Authorizing Provider  lisinopril (ZESTRIL) 10 MG tablet Take 1 tablet (10 mg total) by mouth daily. Patient taking differently: Take 10 mg by mouth every evening.  06/19/19  Yes Soyla Dryer, PA-C  amoxicillin-clavulanate (AUGMENTIN) 875-125 MG tablet Take 1 tablet by mouth every 12 (twelve) hours for 7 days. 06/23/19 06/30/19  Makeya Hilgert S, PA-C  chlorhexidine gluconate, MEDLINE KIT, (PERIDEX) 0.12 % solution Use as directed 15 mLs in the mouth or throat 2 (two) times daily for 7 days. 06/23/19 06/30/19  Eloise Mula S, PA-C    Family History Family History  Problem Relation Age of Onset  . Cancer Mother        intestinal cancer  . Diabetes Father   . Kidney disease Father   . Cancer Father        lung cancer  . Diabetes Paternal Aunt     Social History Social History   Tobacco Use  . Smoking status: Never Smoker  . Smokeless tobacco: Never Used   Substance Use Topics  . Alcohol use: Yes    Alcohol/week: 25.0 standard drinks    Types: 25 Cans of beer per week    Comment: 6 pack beer every other day  . Drug use: Yes    Types: Marijuana     Allergies   Bee venom, Aspirin, and Morphine and related   Review of Systems Review of Systems  Constitutional: Negative for fever.  HENT: Positive for dental problem.        Facial swelling  Eyes: Negative for visual disturbance.  Respiratory: Negative for shortness of breath.   Cardiovascular: Negative for chest pain.  Gastrointestinal: Negative for abdominal pain, nausea and vomiting.  Genitourinary: Negative for flank pain.  Musculoskeletal: Negative for myalgias.  Skin: Negative for wound.  Neurological: Negative for headaches.     Physical Exam Updated Vital Signs BP (!) 181/93 (BP Location: Left Arm)   Pulse 90   Temp 98.2 F (36.8 C) (Oral)   Resp 18   Ht '6\' 1"'  (1.854 m)   Wt 79.4 kg   SpO2 97%   BMI 23.09 kg/m   Physical Exam Vitals signs and nursing note reviewed.  Constitutional:      Appearance: He is well-developed.  HENT:     Head: Normocephalic  and atraumatic.     Mouth/Throat:     Comments: Multiple dental caries and fractured teeth. There is large area of fluctuance to the right lower mouth that is TTP. There is some induration to the right lower cheek and chin. No trismus. No sublingual swelling. No swelling to the neck.  Eyes:     Conjunctiva/sclera: Conjunctivae normal.  Neck:     Musculoskeletal: Neck supple.  Cardiovascular:     Rate and Rhythm: Normal rate.  Pulmonary:     Effort: Pulmonary effort is normal. No respiratory distress.  Abdominal:     General: Abdomen is flat.  Skin:    General: Skin is warm and dry.  Neurological:     Mental Status: He is alert.      ED Treatments / Results  Labs (all labs ordered are listed, but only abnormal results are displayed) Labs Reviewed  I-STAT CREATININE, ED    EKG None  Radiology  Ct Soft Tissue Neck W Contrast  Result Date: 06/23/2019 CLINICAL DATA:  Swelling of the right mandibular region. Assess for deep space infection. EXAM: CT NECK WITH CONTRAST TECHNIQUE: Multidetector CT imaging of the neck was performed using the standard protocol following the bolus administration of intravenous contrast. CONTRAST:  36m OMNIPAQUE IOHEXOL 300 MG/ML  SOLN COMPARISON:  None. FINDINGS: Pharynx and larynx: No mucosal or submucosal lesion. Salivary glands: Parotid and submandibular glands are normal. Thyroid: Normal Lymph nodes: No enlarged or low-density nodes on either side of the neck. Vascular: Normal Limited intracranial: Normal Visualized orbits: Normal Mastoids and visualized paranasal sinuses: Clear Skeleton: Patient has advanced dental and periodontal disease. This is particularly advanced with respect to the right mandibular dentition where there are multiple root abscesses and cortical erosion along the lateral cortex, responsible for the pronounced soft tissue inflammatory disease of the right lower face. This includes a 2 cm region of low-density that could represent a developing abscess. No deep space extension. Upper chest: Negative Other: Nonspecific cellulitis changes of the superficial right neck. IMPRESSION: Advanced dental and periodontal disease, most pronounced affecting the right mandibular dentition, with multiple root abscesses including lateral cortical erosion and associated soft tissue inflammatory changes of the right lower face. There is a 2 cm poorly enhancing region that could represent a developing abscess. No evidence of deep space extension or nodal suppuration. Electronically Signed   By: MNelson ChimesM.D.   On: 06/23/2019 16:11    Procedures ..Marland Kitchenncision and Drainage  Date/Time: 06/30/2019 8:03 PM Performed by: CRodney Booze PA-C Authorized by: CRodney Booze PA-C   Consent:    Consent obtained:  Verbal   Consent given by:  Patient   Risks  discussed:  Bleeding, incomplete drainage and pain   Alternatives discussed:  No treatment Location:    Type:  Abscess   Location:  Mouth   Mouth location: right lower gums. Anesthesia (see MAR for exact dosages):    Anesthesia method:  Local infiltration   Local anesthetic:  Lidocaine 1% w/o epi Procedure type:    Complexity:  Simple Procedure details:    Incision types:  Stab incision   Scalpel blade:  11   Drainage:  Purulent   Drainage amount:  Moderate   Wound treatment:  Wound left open   Packing materials:  None Post-procedure details:    Patient tolerance of procedure:  Tolerated well, no immediate complications   (including critical care time)  Medications Ordered in ED Medications  lidocaine (PF) (XYLOCAINE) 1 % injection (has  no administration in time range)  acetaminophen (TYLENOL) tablet 650 mg (650 mg Oral Given 06/23/19 1436)  iohexol (OMNIPAQUE) 300 MG/ML solution 75 mL (75 mLs Intravenous Contrast Given 06/23/19 1601)  HYDROcodone-acetaminophen (NORCO/VICODIN) 5-325 MG per tablet 1 tablet (1 tablet Oral Given 06/23/19 1626)     Initial Impression / Assessment and Plan / ED Course  I have reviewed the triage vital signs and the nursing notes.  Pertinent labs & imaging results that were available during my care of the patient were reviewed by me and considered in my medical decision making (see chart for details).   Final Clinical Impressions(s) / ED Diagnoses   Final diagnoses:  Dental abscess   58 y/o male presenting for swelling/pain to the right lower face and gums starting 2 days ago. Being w/u for soft tissue/connective tissue neoplasm. No fevers.  Multiple dental caries and missing teeth. Has swelling to the right lower gums. No trismus or sublingual swelling.   CT soft tissue neck obtained which showed advanced dental and periodontal disease, most pronounced affecting the right mandibular dentition, with multiple root abscesses including lateral  cortical erosion and associated soft tissue inflammatory changes of the right lower face. There is a 2 cm poorly enhancing region that could represent a developing abscess. No evidence of deep space extension or nodal suppuration.   Abscess was incised and drained in the ED with moderate purulent drainage. Pt will be given rx for augmentin and peridex. Advised to f/u with dentist and to return if worse. He voices understanding and is in agreement with the plan. All questions answered, pt stable for d/c.   ED Discharge Orders         Ordered    amoxicillin-clavulanate (AUGMENTIN) 875-125 MG tablet  Every 12 hours,   Status:  Discontinued     06/23/19 1712    amoxicillin-clavulanate (AUGMENTIN) 875-125 MG tablet  Every 12 hours     06/23/19 1712    chlorhexidine gluconate, MEDLINE KIT, (PERIDEX) 0.12 % solution  2 times daily     06/23/19 1716           Angles Trevizo S, PA-C 06/23/19 1717    Jayli Fogleman S, PA-C 06/30/19 2004    Maudie Flakes, MD 07/02/19 [phone]

## 2019-06-23 NOTE — ED Triage Notes (Signed)
Pt reports abscess to right face since yesterday.

## 2019-06-25 ENCOUNTER — Encounter: Admit: 2019-06-25 | Discharge: 2019-06-26 | Payer: PRIVATE HEALTH INSURANCE

## 2019-06-25 DIAGNOSIS — R2231 Localized swelling, mass and lump, right upper limb: Secondary | ICD-10-CM

## 2019-06-25 DIAGNOSIS — C4911 Malignant neoplasm of connective and soft tissue of right upper limb, including shoulder: Principal | ICD-10-CM

## 2019-06-26 ENCOUNTER — Ambulatory Visit: Payer: Self-pay | Admitting: Surgery

## 2019-07-10 ENCOUNTER — Ambulatory Visit: Payer: Self-pay | Admitting: Physician Assistant

## 2019-07-10 ENCOUNTER — Encounter
Admit: 2019-07-10 | Discharge: 2019-07-11 | Payer: PRIVATE HEALTH INSURANCE | Attending: Hematology & Oncology | Primary: Hematology & Oncology

## 2019-07-10 ENCOUNTER — Encounter: Admit: 2019-07-10 | Discharge: 2019-07-23 | Payer: PRIVATE HEALTH INSURANCE

## 2019-07-10 ENCOUNTER — Encounter
Admit: 2019-07-10 | Discharge: 2019-07-23 | Payer: PRIVATE HEALTH INSURANCE | Attending: Radiation Oncology | Primary: Radiation Oncology

## 2019-07-10 DIAGNOSIS — C499 Malignant neoplasm of connective and soft tissue, unspecified: Secondary | ICD-10-CM

## 2019-07-10 DIAGNOSIS — C4911 Malignant neoplasm of connective and soft tissue of right upper limb, including shoulder: Principal | ICD-10-CM

## 2019-07-10 MED ORDER — TRAZODONE 50 MG TABLET
ORAL_TABLET | Freq: Every evening | ORAL | 1 refills | 30.00000 days | Status: CP
Start: 2019-07-10 — End: 2019-08-09

## 2019-07-12 ENCOUNTER — Ambulatory Visit
Admit: 2019-07-12 | Discharge: 2019-07-23 | Payer: PRIVATE HEALTH INSURANCE | Attending: Internal Medicine | Primary: Internal Medicine

## 2019-07-12 DIAGNOSIS — C499 Malignant neoplasm of connective and soft tissue, unspecified: Principal | ICD-10-CM

## 2019-07-16 DIAGNOSIS — C4911 Malignant neoplasm of connective and soft tissue of right upper limb, including shoulder: Principal | ICD-10-CM

## 2019-07-19 ENCOUNTER — Encounter: Payer: Self-pay | Admitting: Physician Assistant

## 2019-07-19 ENCOUNTER — Ambulatory Visit: Payer: Self-pay | Admitting: Physician Assistant

## 2019-07-19 ENCOUNTER — Other Ambulatory Visit: Payer: Self-pay

## 2019-07-19 VITALS — BP 150/80 | HR 89 | Temp 98.1°F | Wt 173.2 lb

## 2019-07-19 DIAGNOSIS — I10 Essential (primary) hypertension: Secondary | ICD-10-CM

## 2019-07-19 DIAGNOSIS — C801 Malignant (primary) neoplasm, unspecified: Secondary | ICD-10-CM

## 2019-07-19 MED ORDER — LISINOPRIL 20 MG PO TABS: 20.0000 mg | ORAL_TABLET | Freq: Every day | ORAL | 3 refills | Status: DC

## 2019-07-19 NOTE — Progress Notes (Signed)
BP (!) 150/80   Pulse 89   Temp 98.1 F (36.7 C)   Wt 173 lb 3.2 oz (78.6 kg)   SpO2 97%   BMI 22.85 kg/m    Subjective:    Patient ID: [name], male    DOB: 01-27-1961, 58 y.o.   MRN: [id number]  HPI: [name] is a 58 y.o. male presenting on 07/19/2019 for No chief complaint on file.   HPI   Pt had a negative covid 19 screening questionnaire    Today is pt's second visit to this office.  He was referred to surgery for arm mass.  The general surgeon referred him to a tertiary care center for biopsy.   He apparently has been referred around and is presumably seen an oncologist.    Pt says he went last week and got another ct.  He sya she got his "tatoos" and he starts Radiation Treatments on september 1.   He says he will not be treated with chemotherapy right now.     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  He says he has places on his lung in addition to the areas on his arm and axilla.    He says he was recommended to apply for medicaid but has not done so yet.   He says his mood is okay and he denies depression.  He says he has good support with family and friends.     Relevant past medical, surgical, family and social history reviewed and updated as indicated. Interim medical history since our last visit reviewed. Allergies and medications reviewed and updated.   Current Outpatient Medications:  .  lisinopril (ZESTRIL) 10 MG tablet, Take 1 tablet (10 mg total) by mouth daily., Disp: 30 tablet, Rfl: 1     Review of Systems  Per HPI unless specifically indicated above     Objective:    BP (!) 150/80   Pulse 89   Temp 98.1 F (36.7 C)   Wt 173 lb 3.2 oz (78.6 kg)   SpO2 97%   BMI 22.85 kg/m   Wt Readings from Last 3 Encounters:  07/19/19 173 lb 3.2 oz (78.6 kg)  06/23/19 175 lb (79.4 kg)  06/15/19 175 lb 6.4 oz (79.6 kg)    Physical Exam Vitals signs reviewed.  Constitutional:      General: He is not in acute distress.  Appearance: Normal appearance. He is well-developed. He is not ill-appearing.  HENT:     Head: Normocephalic and atraumatic.  Neck:     Musculoskeletal: Neck supple.  Cardiovascular:     Rate and Rhythm: Normal rate and regular rhythm.  Pulmonary:     Effort: Pulmonary effort is normal.     Breath sounds: Normal breath sounds. No wheezing.  Abdominal:     General: Bowel sounds are normal.     Palpations: Abdomen is soft.     Tenderness: There is no abdominal tenderness.  Musculoskeletal:     Right lower leg: No edema.     Left lower leg: No edema.  Lymphadenopathy:     Cervical: No cervical adenopathy.  Skin:    General: Skin is warm and dry.  Neurological:     Mental Status: He is alert and oriented to person, place, and time.  Psychiatric:        Attention and Perception: Attention normal.        Speech: Speech normal.        Behavior: Behavior  normal. Behavior is cooperative.         Assessment & Plan:   Encounter Diagnoses  Name Primary?  . Essential hypertension Yes  . Cancer ([place])       -will Increase lisinopril -Pt to continue with oncologists as scheduled.  Encouraged him to have them send notes with his treatment  -Pt encouraged to apply for medicaid in light of stage 4 cancer -Pt to follow up in 1 month to recheck bp.  He is to contact office sooner prn

## 2019-07-24 ENCOUNTER — Encounter: Admit: 2019-07-24 | Discharge: 2019-08-22 | Payer: PRIVATE HEALTH INSURANCE

## 2019-07-24 DIAGNOSIS — C4911 Malignant neoplasm of connective and soft tissue of right upper limb, including shoulder: Secondary | ICD-10-CM

## 2019-07-25 DIAGNOSIS — C499 Malignant neoplasm of connective and soft tissue, unspecified: Secondary | ICD-10-CM

## 2019-08-01 DIAGNOSIS — C499 Malignant neoplasm of connective and soft tissue, unspecified: Secondary | ICD-10-CM

## 2019-08-06 ENCOUNTER — Telehealth: Admit: 2019-08-06 | Discharge: 2019-08-07

## 2019-08-06 DIAGNOSIS — F4310 Post-traumatic stress disorder, unspecified: Secondary | ICD-10-CM

## 2019-08-06 DIAGNOSIS — F411 Generalized anxiety disorder: Secondary | ICD-10-CM

## 2019-08-06 DIAGNOSIS — F1020 Alcohol dependence, uncomplicated: Secondary | ICD-10-CM

## 2019-08-06 DIAGNOSIS — F5105 Insomnia due to other mental disorder: Secondary | ICD-10-CM

## 2019-08-08 DIAGNOSIS — C499 Malignant neoplasm of connective and soft tissue, unspecified: Secondary | ICD-10-CM

## 2019-08-15 DIAGNOSIS — C499 Malignant neoplasm of connective and soft tissue, unspecified: Secondary | ICD-10-CM

## 2019-08-21 ENCOUNTER — Ambulatory Visit: Payer: Self-pay | Admitting: Physician Assistant

## 2019-08-22 DIAGNOSIS — C499 Malignant neoplasm of connective and soft tissue, unspecified: Secondary | ICD-10-CM

## 2019-08-22 MED ORDER — HYDROCODONE 5 MG-ACETAMINOPHEN 325 MG TABLET
ORAL_TABLET | Freq: Three times a day (TID) | ORAL | 0 refills | 4.00000 days | Status: CP | PRN
Start: 2019-08-22 — End: ?

## 2019-08-22 MED ORDER — SILVER SULFADIAZINE 1 % TOPICAL CREAM
1 refills | 0 days | Status: CP
Start: 2019-08-22 — End: 2020-08-21

## 2019-08-23 ENCOUNTER — Ambulatory Visit: Admit: 2019-08-23 | Discharge: 2019-09-22 | Payer: PRIVATE HEALTH INSURANCE

## 2019-08-23 ENCOUNTER — Encounter
Admit: 2019-08-23 | Discharge: 2019-09-22 | Payer: PRIVATE HEALTH INSURANCE | Attending: Radiation Oncology | Primary: Radiation Oncology

## 2019-08-28 DIAGNOSIS — C801 Malignant (primary) neoplasm, unspecified: Secondary | ICD-10-CM

## 2019-08-29 ENCOUNTER — Ambulatory Visit: Payer: Self-pay | Admitting: Physician Assistant

## 2019-08-29 ENCOUNTER — Encounter: Payer: Self-pay | Admitting: Physician Assistant

## 2019-08-29 ENCOUNTER — Other Ambulatory Visit: Payer: Self-pay

## 2019-08-29 VITALS — BP 142/90 | HR 95 | Temp 98.1°F

## 2019-08-29 DIAGNOSIS — C499 Malignant neoplasm of connective and soft tissue, unspecified: Secondary | ICD-10-CM

## 2019-08-29 DIAGNOSIS — I10 Essential (primary) hypertension: Secondary | ICD-10-CM

## 2019-08-29 MED ORDER — LISINOPRIL 20 MG PO TABS: 20.0000 mg | ORAL_TABLET | Freq: Every day | ORAL | 3 refills | Status: DC

## 2019-08-29 NOTE — Progress Notes (Signed)
BP (!) 142/90   Pulse 95   Temp 98.1 F (36.7 C)   SpO2 97%    Subjective:    Patient ID: [name], male    DOB: December 20, 1960, 58 y.o.   MRN: [id number]  HPI: [name] is a 58 y.o. male presenting on 08/29/2019 for Hypertension   HPI  Pt had a negative covid 19 screening questionnaire    Pt is currently being followed at [facility] for stage IV sarcoma  He says he doesn't have medicaid yet-   He is going tomorrow to check on that  He says he Finished radiation last week  He has follow up at [facility] later this month  He isn't sure if they are going to do chemotherapy or not  He checks his bp at home-  One night it was 97.     He ran out of 20mg  lisinopril so he's been taking 2- 10mg  tabs    Relevant past medical, surgical, family and social history reviewed and updated as indicated. Interim medical history since our last visit reviewed. Allergies and medications reviewed and updated.   Current Outpatient Medications:  .  HYDROcodone-acetaminophen (NORCO/VICODIN) 5-325 MG tablet, Take 1 tablet by mouth every 8 (eight) hours as needed for moderate pain., Disp: , Rfl:  .  lisinopril (ZESTRIL) 10 MG tablet, Take 10 mg by mouth daily., Disp: , Rfl:  .  silver sulfADIAZINE (SILVADENE) 1 % cream, Apply 1 application topically daily., Disp: , Rfl:  .  lisinopril (ZESTRIL) 20 MG tablet, Take 1 tablet (20 mg total) by mouth daily. (Patient not taking: Reported on 08/29/2019), Disp: 30 tablet, Rfl: 3    Review of Systems  Per HPI unless specifically indicated above     Objective:    BP (!) 142/90   Pulse 95   Temp 98.1 F (36.7 C)   SpO2 97%   Wt Readings from Last 3 Encounters:  07/19/19 173 lb 3.2 oz (78.6 kg)  06/23/19 175 lb (79.4 kg)  06/15/19 175 lb 6.4 oz (79.6 kg)    Physical Exam Vitals signs reviewed.  Constitutional:      General: He is not in acute distress.    Appearance: He is well-developed.  HENT:     Head: Normocephalic and atraumatic.   Neck:     Musculoskeletal: Neck supple.  Cardiovascular:     Rate and Rhythm: Normal rate and regular rhythm.  Pulmonary:     Effort: Pulmonary effort is normal.     Breath sounds: Normal breath sounds. No wheezing.  Abdominal:     General: Bowel sounds are normal.     Palpations: Abdomen is soft.     Tenderness: There is no abdominal tenderness.  Lymphadenopathy:     Cervical: No cervical adenopathy.  Skin:    General: Skin is warm and dry.  Neurological:     Mental Status: He is alert and oriented to person, place, and time.  Psychiatric:        Attention and Perception: Attention normal.        Speech: Speech normal.        Behavior: Behavior normal. Behavior is cooperative.     Results for orders placed or performed during the hospital encounter of 06/23/19  I-Stat Creatinine, ED (not at [facility])  Result Value Ref Range   Creatinine, Ser 0.70 0.61 - 1.24 mg/dL      Assessment & Plan:    Encounter Diagnoses  Name Primary?  . Essential hypertension Yes  .  Sarcoma ([place])       -No increases in bp meds.  Need to prevent low bp.  Pt to monitor at home.  He is to notify office if SBP < 100 or > 150.    -pt to continue with [facility] for treatment of cancer  -Pt to follow up with telemedicine visit- 1 month.  He is to contact office sooner prn.  He is encouraged to notify office when he gets his medicaid

## 2019-09-19 DIAGNOSIS — C499 Malignant neoplasm of connective and soft tissue, unspecified: Principal | ICD-10-CM

## 2019-09-26 ENCOUNTER — Encounter: Payer: Self-pay | Admitting: Physician Assistant

## 2019-09-26 ENCOUNTER — Ambulatory Visit: Payer: Self-pay | Admitting: Physician Assistant

## 2019-09-26 VITALS — BP 124/68 | HR 82

## 2019-09-26 DIAGNOSIS — I10 Essential (primary) hypertension: Secondary | ICD-10-CM

## 2019-09-26 DIAGNOSIS — C499 Malignant neoplasm of connective and soft tissue, unspecified: Secondary | ICD-10-CM

## 2019-09-26 MED ORDER — LISINOPRIL 20 MG PO TABS: 20.0000 mg | ORAL_TABLET | Freq: Every day | ORAL | 3 refills | Status: DC

## 2019-09-26 NOTE — Progress Notes (Signed)
   BP 124/68   Pulse 82    Subjective:    Patient ID: [name], male    DOB: 11-12-1961, 58 y.o.   MRN: [id number]  HPI: [name] is a 58 y.o. male presenting on 09/26/2019 for No chief complaint on file.   HPI    This is a telemedicine appointment due to coronavirus pandemic.  It is via telephone as pt does not have video enabled device  I connected with  [name] on 09/26/19 by a video enabled telemedicine application and verified that I am speaking with the correct person using two identifiers.   I discussed the limitations of evaluation and management by telemedicine. The patient expressed understanding and agreed to proceed.  Pt is at home.  Provider is working from home office.   Pt is 58yo male who was referred to [facility] for evaluation and treatment of cancer.  He has been diagnosed with sarcoma.  He says he has finished his radiation treatment.  He is Going for follow up imaging  on 10/11/19.  He says that based upon those results, they will discuss whether or not chemotherapy is indicated.    Pt has been monitoring his BP at home.  He says it has been running good.    He says he has still not heard anything on his medicaid application  He says his Mood is good.  He denies depression, anxiety.      Relevant past medical, surgical, family and social history reviewed and updated as indicated. Interim medical history since our last visit reviewed. Allergies and medications reviewed and updated.   Current Outpatient Medications:  .  lisinopril (ZESTRIL) 20 MG tablet, Take 1 tablet (20 mg total) by mouth daily., Disp: 30 tablet, Rfl: 3 .  silver sulfADIAZINE (SILVADENE) 1 % cream, Apply 1 application topically daily., Disp: , Rfl:    Review of Systems  Per HPI unless specifically indicated above     Objective:    BP 124/68   Pulse 82   Wt Readings from Last 3 Encounters:  07/19/19 173 lb 3.2 oz (78.6 kg)  06/23/19 175 lb (79.4 kg)  06/15/19 175  lb 6.4 oz (79.6 kg)    Physical Exam Pulmonary:     Effort: Pulmonary effort is normal. No respiratory distress.  Neurological:     Mental Status: He is alert and oriented to person, place, and time.  Psychiatric:        Attention and Perception: Attention normal.        Mood and Affect: Mood normal.        Speech: Speech normal.        Behavior: Behavior is cooperative.         Assessment & Plan:     1. Hypertension  Pt is well controlled on his lisinopril.  He is to continue with this medication.  Refill sent to his pharmacy  2. Sarcoma  Pt to continue with cancer care through [facility] per their recommendation   Pt is to follow up in 2 months.  He is to contact office sooner as needed

## 2019-10-11 ENCOUNTER — Encounter: Admit: 2019-10-11 | Discharge: 2019-10-12 | Payer: PRIVATE HEALTH INSURANCE

## 2019-10-15 ENCOUNTER — Encounter
Admit: 2019-10-15 | Discharge: 2019-10-16 | Payer: PRIVATE HEALTH INSURANCE | Attending: Hematology & Oncology | Primary: Hematology & Oncology

## 2019-10-30 ENCOUNTER — Encounter
Admit: 2019-10-30 | Discharge: 2019-11-22 | Payer: PRIVATE HEALTH INSURANCE | Attending: Student in an Organized Health Care Education/Training Program | Primary: Student in an Organized Health Care Education/Training Program

## 2019-10-30 ENCOUNTER — Encounter: Admit: 2019-10-30 | Discharge: 2019-11-22 | Payer: PRIVATE HEALTH INSURANCE

## 2019-10-30 ENCOUNTER — Encounter
Admit: 2019-10-30 | Discharge: 2019-11-22 | Payer: PRIVATE HEALTH INSURANCE | Attending: Radiation Oncology | Primary: Radiation Oncology

## 2019-10-30 DIAGNOSIS — C499 Malignant neoplasm of connective and soft tissue, unspecified: Principal | ICD-10-CM

## 2019-10-30 DIAGNOSIS — C4911 Malignant neoplasm of connective and soft tissue of right upper limb, including shoulder: Principal | ICD-10-CM

## 2019-11-06 ENCOUNTER — Encounter
Admit: 2019-11-06 | Discharge: 2019-11-22 | Payer: PRIVATE HEALTH INSURANCE | Attending: Radiation Oncology | Primary: Radiation Oncology

## 2019-11-21 DIAGNOSIS — C499 Malignant neoplasm of connective and soft tissue, unspecified: Principal | ICD-10-CM

## 2019-11-21 DIAGNOSIS — C4911 Malignant neoplasm of connective and soft tissue of right upper limb, including shoulder: Principal | ICD-10-CM

## 2019-11-26 ENCOUNTER — Encounter
Admit: 2019-11-26 | Discharge: 2019-12-23 | Payer: PRIVATE HEALTH INSURANCE | Attending: Radiation Oncology | Primary: Radiation Oncology

## 2019-11-26 ENCOUNTER — Encounter: Admit: 2019-11-26 | Discharge: 2019-12-23 | Payer: PRIVATE HEALTH INSURANCE

## 2019-11-29 DIAGNOSIS — C499 Malignant neoplasm of connective and soft tissue, unspecified: Principal | ICD-10-CM

## 2019-11-29 MED ORDER — MIRTAZAPINE 15 MG DISINTEGRATING TABLET
ORAL_TABLET | Freq: Every evening | ORAL | 1 refills | 30 days | Status: CP
Start: 2019-11-29 — End: 2020-01-28

## 2019-12-05 DIAGNOSIS — C4911 Malignant neoplasm of connective and soft tissue of right upper limb, including shoulder: Principal | ICD-10-CM

## 2019-12-05 DIAGNOSIS — C499 Malignant neoplasm of connective and soft tissue, unspecified: Principal | ICD-10-CM

## 2019-12-12 DIAGNOSIS — C499 Malignant neoplasm of connective and soft tissue, unspecified: Principal | ICD-10-CM

## 2019-12-19 ENCOUNTER — Ambulatory Visit: Payer: Self-pay | Admitting: Physician Assistant

## 2019-12-19 DIAGNOSIS — C499 Malignant neoplasm of connective and soft tissue, unspecified: Principal | ICD-10-CM

## 2019-12-19 DIAGNOSIS — C4911 Malignant neoplasm of connective and soft tissue of right upper limb, including shoulder: Principal | ICD-10-CM

## 2019-12-19 MED ORDER — SILVER SULFADIAZINE 1 % TOPICAL CREAM
1 refills | 0 days | Status: CP
Start: 2019-12-19 — End: 2020-12-18

## 2019-12-20 ENCOUNTER — Encounter
Admit: 2019-12-20 | Discharge: 2019-12-21 | Payer: PRIVATE HEALTH INSURANCE | Attending: Sports Medicine | Primary: Sports Medicine

## 2019-12-20 DIAGNOSIS — M79601 Pain in right arm: Principal | ICD-10-CM

## 2019-12-24 ENCOUNTER — Ambulatory Visit: Payer: Medicaid Other | Admitting: Physician Assistant

## 2019-12-24 DIAGNOSIS — C499 Malignant neoplasm of connective and soft tissue, unspecified: Secondary | ICD-10-CM

## 2019-12-24 DIAGNOSIS — I10 Essential (primary) hypertension: Secondary | ICD-10-CM

## 2019-12-24 MED ORDER — LISINOPRIL 20 MG PO TABS: 20.0000 mg | ORAL_TABLET | Freq: Every day | ORAL | 1 refills | Status: AC

## 2019-12-24 NOTE — Progress Notes (Signed)
   There were no vitals taken for this visit.   Subjective:    Patient ID: [name], male    DOB: 01/24/1961, 59 y.o.   MRN: [id number]  HPI: [name] is a 59 y.o. male presenting on 12/24/2019 for No chief complaint on file.   HPI  this is a telemedicine appointment due to coronavirus pandemic.  It is via telephone as pt does not have a video enabled device.  I connected with  [name] on 12/24/19 by a video enabled telemedicine application and verified that I am speaking with the correct person using two identifiers.   I discussed the limitations of evaluation and management by telemedicine. The patient expressed understanding and agreed to proceed.  Pt at home.  Provider at office    Pt now has medicaid.  He is aware that he is no longer eligible to be pt at [facility] due to having insurance now.  He is still going to [facility] for cancer and has been getting treatment He is scheduled to see orthopedics for a muscle problem He says his oncologist is going to get him a PCP to manage his bp.   bp 145/84 10/30/19 at rad onc.  Last OV here 09/26/19- bp 124/68    Relevant past medical, surgical, family and social history reviewed and updated as indicated. Interim medical history since our last visit reviewed. Allergies and medications reviewed and updated.   Current Outpatient Medications:  .  lisinopril (ZESTRIL) 20 MG tablet, Take 1 tablet (20 mg total) by mouth daily., Disp: 30 tablet, Rfl: 3 .  silver sulfADIAZINE (SILVADENE) 1 % cream, Apply 1 application topically daily., Disp: , Rfl:      Review of Systems  Per HPI unless specifically indicated above     Objective:    There were no vitals taken for this visit.  Wt Readings from Last 3 Encounters:  07/19/19 173 lb 3.2 oz (78.6 kg)  06/23/19 175 lb (79.4 kg)  06/15/19 175 lb 6.4 oz (79.6 kg)    Physical Exam Pulmonary:     Effort: No respiratory distress.  Neurological:     Mental Status: He is alert  and oriented to person, place, and time.  Psychiatric:        Attention and Perception: Attention normal.        Speech: Speech normal.        Behavior: Behavior is cooperative.         Assessment & Plan:    Encounter Diagnoses  Name Primary?  . Essential hypertension Yes  . Sarcoma ([place])       -Refill bp med sent to pharmacy -Pt counseled to establish with new PCP -continue with oncologist

## 2019-12-28 ENCOUNTER — Ambulatory Visit: Admit: 2019-12-28 | Discharge: 2019-12-29 | Payer: PRIVATE HEALTH INSURANCE

## 2019-12-28 ENCOUNTER — Encounter: Admit: 2019-12-28 | Discharge: 2019-12-29 | Payer: PRIVATE HEALTH INSURANCE

## 2019-12-28 MED ORDER — MIRTAZAPINE 15 MG TABLET
ORAL_TABLET | Freq: Every evening | ORAL | 2 refills | 60.00000 days | Status: CP
Start: 2019-12-28 — End: 2020-06-25

## 2019-12-28 MED ORDER — EPINEPHRINE 0.3 MG/0.3 ML INJECTION, AUTO-INJECTOR
Freq: Once | INTRAMUSCULAR | 0 refills | 1.00000 days | Status: CP
Start: 2019-12-28 — End: 2019-12-28

## 2019-12-28 MED ORDER — IBUPROFEN 200 MG TABLET
ORAL_TABLET | Freq: Four times a day (QID) | ORAL | 2 refills | 25.00000 days | Status: CP | PRN
Start: 2019-12-28 — End: 2020-12-27

## 2019-12-28 MED ORDER — ONDANSETRON HCL 4 MG TABLET
ORAL_TABLET | Freq: Every day | ORAL | 1 refills | 30.00000 days | Status: CP | PRN
Start: 2019-12-28 — End: 2020-12-27

## 2020-01-01 ENCOUNTER — Encounter: Admit: 2020-01-01 | Discharge: 2020-01-02 | Payer: PRIVATE HEALTH INSURANCE

## 2020-01-01 ENCOUNTER — Encounter
Admit: 2020-01-01 | Discharge: 2020-01-02 | Payer: PRIVATE HEALTH INSURANCE | Attending: Student in an Organized Health Care Education/Training Program | Primary: Student in an Organized Health Care Education/Training Program

## 2020-01-01 MED ORDER — SILVER SULFADIAZINE 1 % TOPICAL CREAM
1 refills | 0 days | Status: CP
Start: 2020-01-01 — End: 2020-12-30

## 2020-01-02 DIAGNOSIS — E871 Hypo-osmolality and hyponatremia: Principal | ICD-10-CM

## 2020-01-04 DIAGNOSIS — C499 Malignant neoplasm of connective and soft tissue, unspecified: Principal | ICD-10-CM

## 2020-01-04 DIAGNOSIS — E871 Hypo-osmolality and hyponatremia: Principal | ICD-10-CM

## 2020-01-04 DIAGNOSIS — L309 Dermatitis, unspecified: Principal | ICD-10-CM

## 2020-01-04 MED ORDER — SILVER SULFADIAZINE 1 % TOPICAL CREAM
3 refills | 0 days | Status: CP
Start: 2020-01-04 — End: 2021-01-02

## 2020-01-30 ENCOUNTER — Encounter
Admit: 2020-01-30 | Discharge: 2020-02-20 | Payer: PRIVATE HEALTH INSURANCE | Attending: Radiation Oncology | Primary: Radiation Oncology

## 2020-01-30 DIAGNOSIS — C499 Malignant neoplasm of connective and soft tissue, unspecified: Principal | ICD-10-CM

## 2020-01-30 DIAGNOSIS — C4911 Malignant neoplasm of connective and soft tissue of right upper limb, including shoulder: Principal | ICD-10-CM

## 2020-02-05 ENCOUNTER — Encounter: Admit: 2020-02-05 | Discharge: 2020-02-06 | Payer: PRIVATE HEALTH INSURANCE

## 2020-02-05 MED ORDER — OXYCODONE 5 MG TABLET
ORAL_TABLET | Freq: Three times a day (TID) | ORAL | 0 refills | 30 days | Status: CP | PRN
Start: 2020-02-05 — End: 2020-03-06

## 2020-02-07 DIAGNOSIS — G893 Neoplasm related pain (acute) (chronic): Principal | ICD-10-CM

## 2020-02-07 DIAGNOSIS — C499 Malignant neoplasm of connective and soft tissue, unspecified: Principal | ICD-10-CM

## 2020-02-07 MED ORDER — OXYCODONE 5 MG TABLET
ORAL_TABLET | Freq: Three times a day (TID) | ORAL | 0 refills | 30 days | Status: CP | PRN
Start: 2020-02-07 — End: 2020-03-08

## 2020-02-11 DIAGNOSIS — G893 Neoplasm related pain (acute) (chronic): Principal | ICD-10-CM

## 2020-02-11 DIAGNOSIS — C499 Malignant neoplasm of connective and soft tissue, unspecified: Principal | ICD-10-CM

## 2020-02-13 DIAGNOSIS — C499 Malignant neoplasm of connective and soft tissue, unspecified: Principal | ICD-10-CM

## 2020-02-14 DIAGNOSIS — G893 Neoplasm related pain (acute) (chronic): Principal | ICD-10-CM

## 2020-02-14 DIAGNOSIS — C499 Malignant neoplasm of connective and soft tissue, unspecified: Principal | ICD-10-CM

## 2020-02-14 MED ORDER — LISINOPRIL 20 MG TABLET
ORAL_TABLET | Freq: Every day | ORAL | 2 refills | 90 days | Status: CP
Start: 2020-02-14 — End: ?

## 2020-02-14 MED ORDER — OXYCODONE 5 MG TABLET
ORAL_TABLET | Freq: Three times a day (TID) | ORAL | 0 refills | 30.00000 days | Status: CP | PRN
Start: 2020-02-14 — End: 2020-03-15

## 2020-02-19 DIAGNOSIS — G893 Neoplasm related pain (acute) (chronic): Principal | ICD-10-CM

## 2020-02-19 DIAGNOSIS — C499 Malignant neoplasm of connective and soft tissue, unspecified: Principal | ICD-10-CM

## 2020-02-21 MED ORDER — OXYCODONE 5 MG CAPSULE
ORAL_CAPSULE | Freq: Four times a day (QID) | ORAL | 0 refills | 8.00000 days | Status: CP | PRN
Start: 2020-02-21 — End: ?

## 2020-03-03 DIAGNOSIS — G893 Neoplasm related pain (acute) (chronic): Principal | ICD-10-CM

## 2020-03-03 DIAGNOSIS — C499 Malignant neoplasm of connective and soft tissue, unspecified: Principal | ICD-10-CM

## 2020-03-03 MED ORDER — OXYCODONE 5 MG TABLET
ORAL_TABLET | Freq: Three times a day (TID) | ORAL | 0 refills | 30.00000 days | Status: CP | PRN
Start: 2020-03-03 — End: 2020-04-02

## 2020-03-10 ENCOUNTER — Telehealth
Admit: 2020-03-10 | Discharge: 2020-03-11 | Payer: PRIVATE HEALTH INSURANCE | Attending: Pharmacist Clinician (PhC)/ Clinical Pharmacy Specialist | Primary: Pharmacist Clinician (PhC)/ Clinical Pharmacy Specialist

## 2020-04-01 ENCOUNTER — Encounter: Admit: 2020-04-01 | Discharge: 2020-04-01 | Payer: PRIVATE HEALTH INSURANCE

## 2020-04-01 ENCOUNTER — Encounter
Admit: 2020-04-01 | Discharge: 2020-04-01 | Payer: PRIVATE HEALTH INSURANCE | Attending: Student in an Organized Health Care Education/Training Program | Primary: Student in an Organized Health Care Education/Training Program

## 2020-04-01 DIAGNOSIS — C499 Malignant neoplasm of connective and soft tissue, unspecified: Principal | ICD-10-CM

## 2020-04-01 DIAGNOSIS — G893 Neoplasm related pain (acute) (chronic): Principal | ICD-10-CM

## 2020-04-04 ENCOUNTER — Encounter
Admit: 2020-04-04 | Discharge: 2020-04-05 | Payer: PRIVATE HEALTH INSURANCE | Attending: Pharmacist Clinician (PhC)/ Clinical Pharmacy Specialist | Primary: Pharmacist Clinician (PhC)/ Clinical Pharmacy Specialist

## 2020-04-04 MED ORDER — OXYCODONE 5 MG TABLET
ORAL_TABLET | Freq: Three times a day (TID) | ORAL | 0 refills | 30 days | Status: CP | PRN
Start: 2020-04-04 — End: ?

## 2020-04-09 DIAGNOSIS — C499 Malignant neoplasm of connective and soft tissue, unspecified: Principal | ICD-10-CM

## 2020-04-15 ENCOUNTER — Encounter: Admit: 2020-04-15 | Discharge: 2020-04-16 | Payer: PRIVATE HEALTH INSURANCE

## 2020-04-15 ENCOUNTER — Encounter
Admit: 2020-04-15 | Discharge: 2020-04-16 | Payer: PRIVATE HEALTH INSURANCE | Attending: Student in an Organized Health Care Education/Training Program | Primary: Student in an Organized Health Care Education/Training Program

## 2020-04-16 DIAGNOSIS — C499 Malignant neoplasm of connective and soft tissue, unspecified: Principal | ICD-10-CM

## 2020-04-23 ENCOUNTER — Encounter: Admit: 2020-04-23 | Discharge: 2020-04-24 | Payer: PRIVATE HEALTH INSURANCE

## 2020-05-01 DIAGNOSIS — C499 Malignant neoplasm of connective and soft tissue, unspecified: Principal | ICD-10-CM

## 2020-05-02 DIAGNOSIS — C499 Malignant neoplasm of connective and soft tissue, unspecified: Principal | ICD-10-CM

## 2020-05-04 IMAGING — CT CT NECK WITH CONTRAST
4 series · 14 of 33 positions shown, 17 images · IV contrast (omnipaque)
Comparison: None.

CLINICAL DATA: Swelling of the right mandibular region. Assess for
deep space infection.

EXAM:
CT NECK WITH CONTRAST
TECHNIQUE: Multidetector CT imaging of the neck was performed using the
standard protocol following the bolus administration of intravenous
contrast.
CONTRAST:  75mL OMNIPAQUE IOHEXOL 300 MG/ML  SOLN

[Series 2: axial neck · axial · 0.51mm/px · z∈[-59,+101]mm · 5 of 120 slices shown, 7 images]
[im 20/120  soft-tissue]
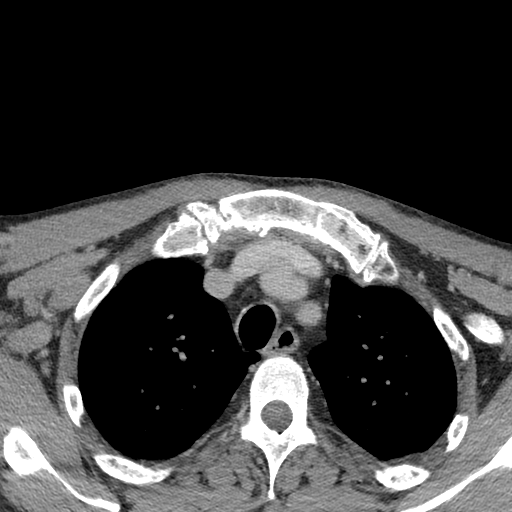
[im 20/120  bone]
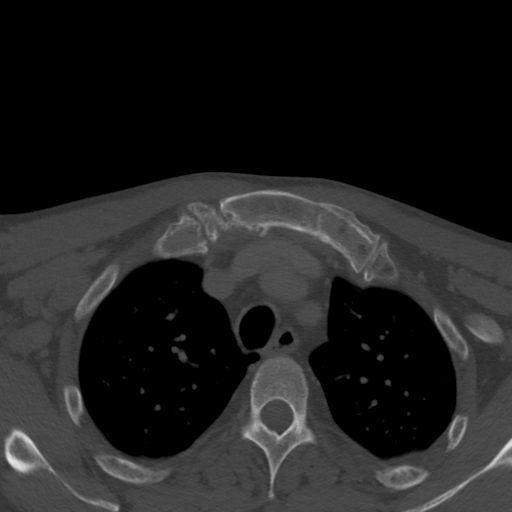
[im 40/120  bone]
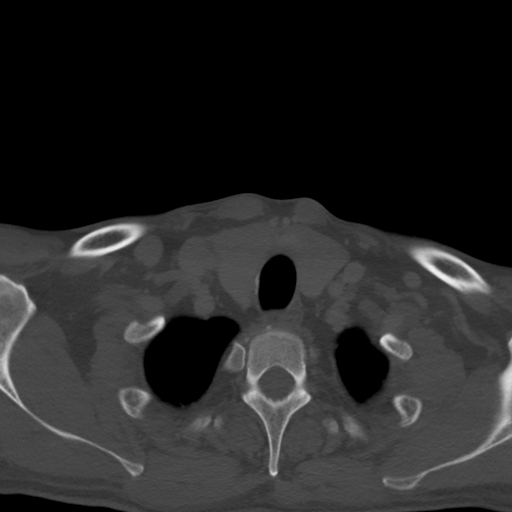
[im 60/120  bone]
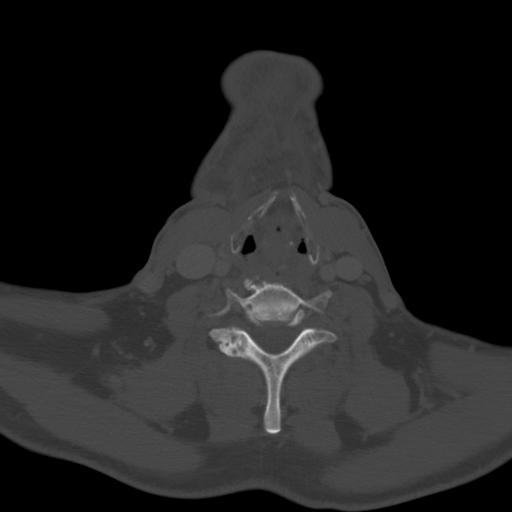
[im 80/120  bone]
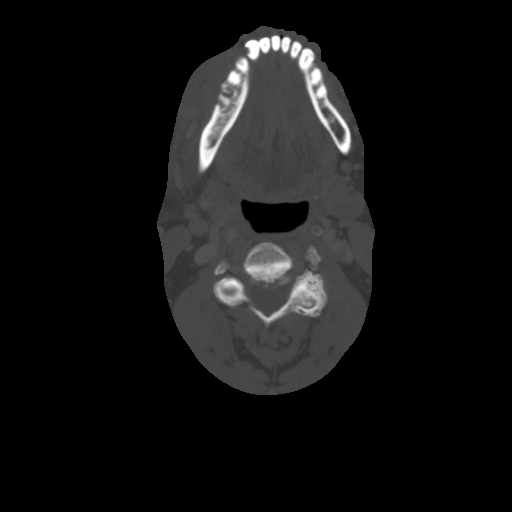
[im 100/120  soft-tissue]
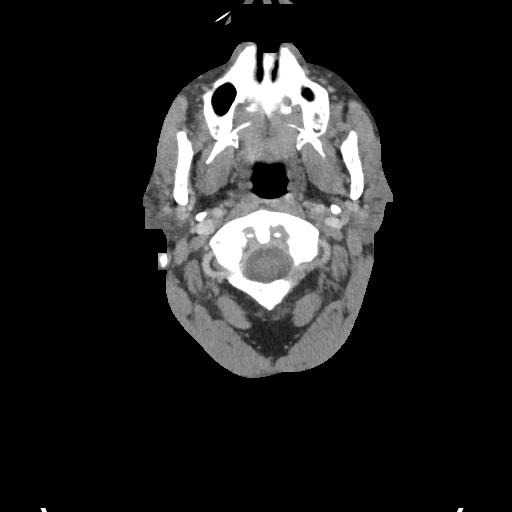
[im 100/120  bone]
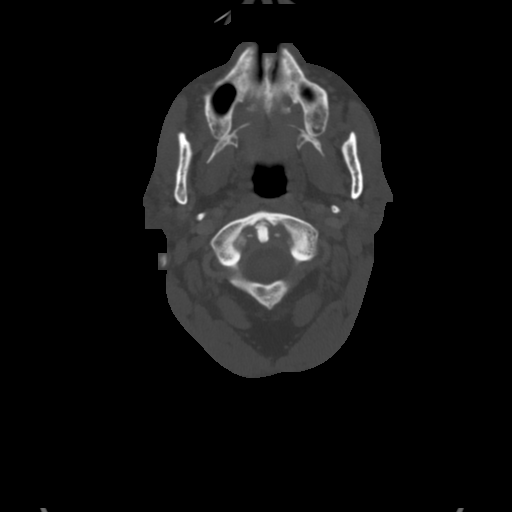

[Series 4: cor neck · coronal · 0.43mm/px · 3 of 105 slices shown]
[im 39/105  bone]
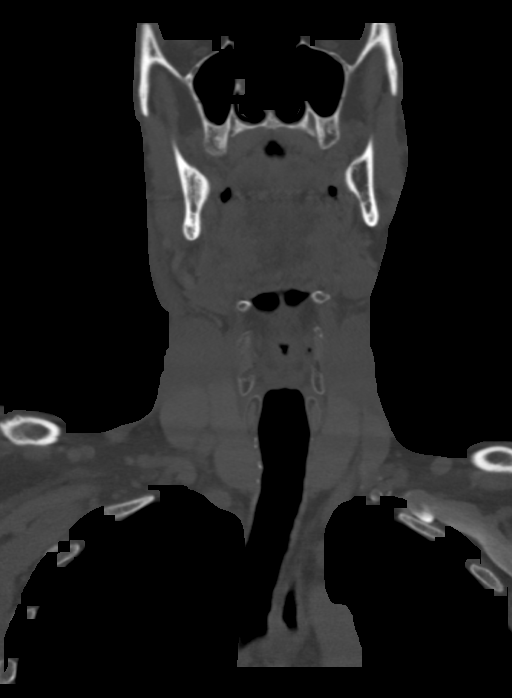
[im 48/105  bone]
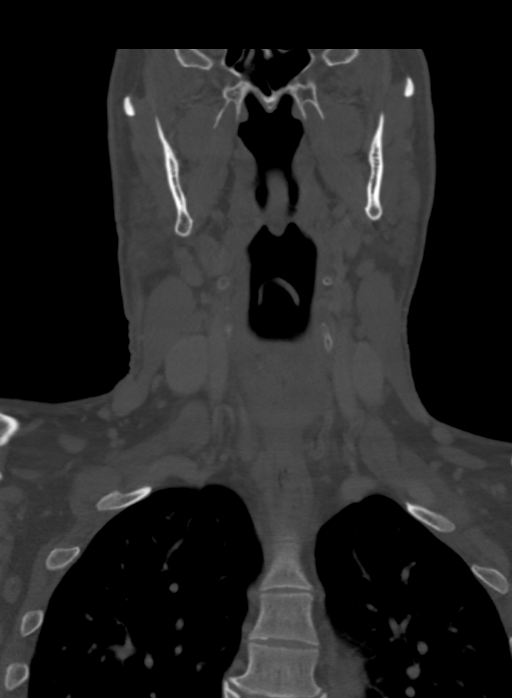
[im 57/105  bone]
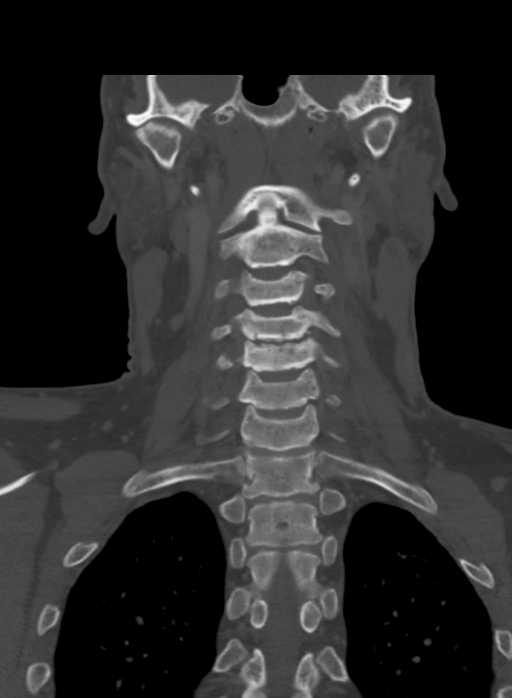

[Series 5: sag neck · sagittal · 0.54mm/px · 5 of 101 slices shown, 6 images]
[im 34/101  bone]
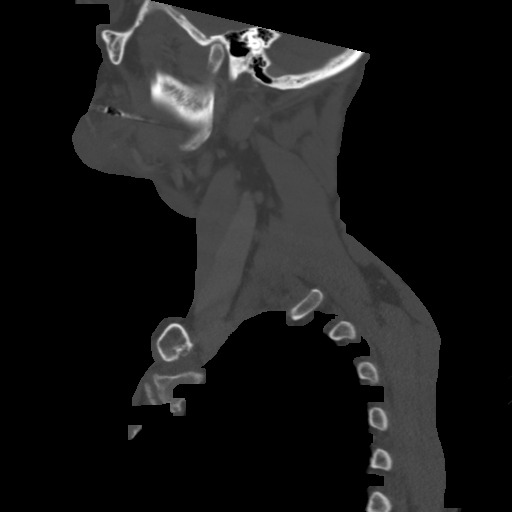
[im 42/101  bone]
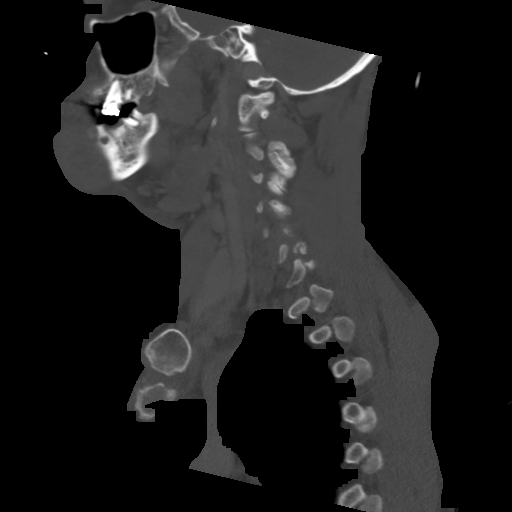
[im 51/101  soft-tissue]
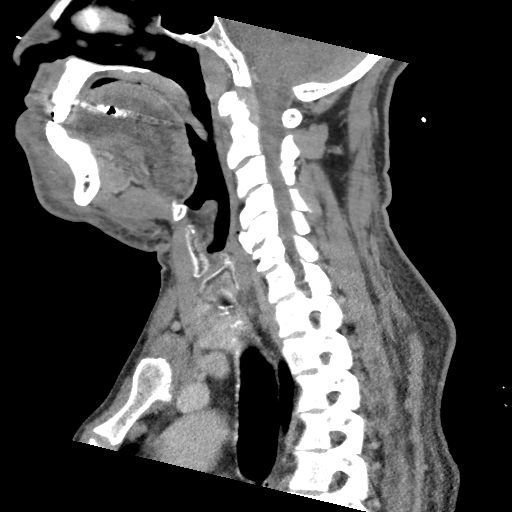
[im 51/101  bone]
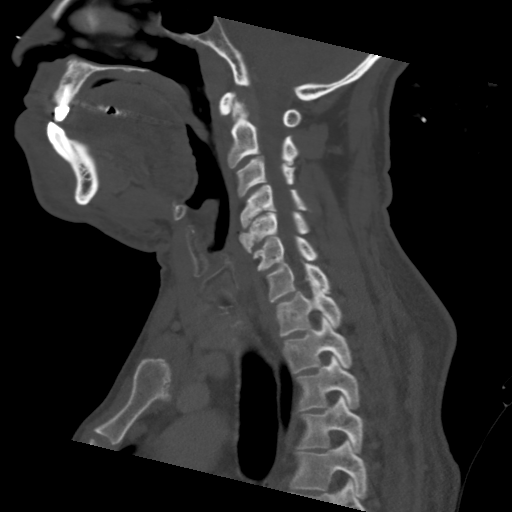
[im 59/101  bone]
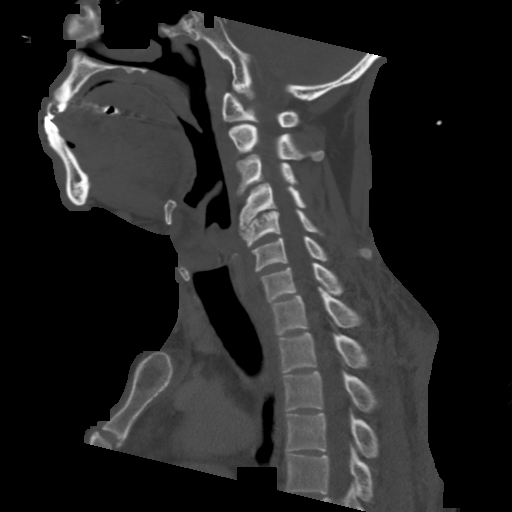
[im 67/101  bone]
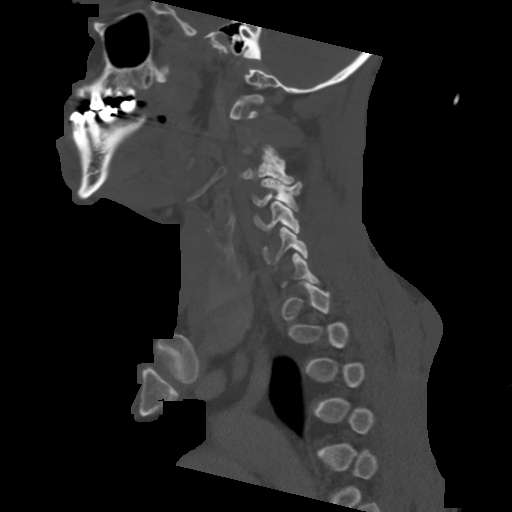

[Series 6: ax oropharynx · axial · 0.39mm/px · 1 of 138 slices shown]
[im 23/138  bone]
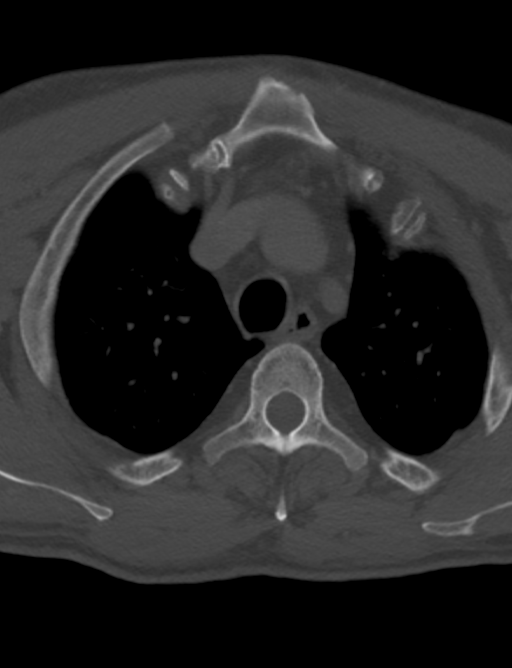

[14 of 33 positions shown; findings below may reference images not displayed]

FINDINGS: Pharynx and larynx: No mucosal or submucosal lesion.

Salivary glands: Parotid and submandibular glands are normal.

Thyroid: Normal

Lymph nodes: No enlarged or low-density nodes on either side of the
neck.

Vascular: Normal

Limited intracranial: Normal

Visualized orbits: Normal

Mastoids and visualized paranasal sinuses: Clear

Skeleton: Patient has advanced dental and periodontal disease. This
is particularly advanced with respect to the right mandibular
dentition where there are multiple root abscesses and cortical
erosion along the lateral cortex, responsible for the pronounced
soft tissue inflammatory disease of the right lower face. This
includes a 2 cm region of low-density that could represent a
developing abscess. No deep space extension.

Upper chest: Negative

Other: Nonspecific cellulitis changes of the superficial right neck.
IMPRESSION: Advanced dental and periodontal disease, most pronounced affecting
the right mandibular dentition, with multiple root abscesses
including lateral cortical erosion and associated soft tissue
inflammatory changes of the right lower face. There is a 2 cm poorly
enhancing region that could represent a developing abscess. No
evidence of deep space extension or nodal suppuration.

## 2020-05-05 ENCOUNTER — Encounter
Admit: 2020-05-05 | Discharge: 2020-05-06 | Payer: PRIVATE HEALTH INSURANCE | Attending: Pharmacist Clinician (PhC)/ Clinical Pharmacy Specialist | Primary: Pharmacist Clinician (PhC)/ Clinical Pharmacy Specialist

## 2020-05-05 DIAGNOSIS — C499 Malignant neoplasm of connective and soft tissue, unspecified: Principal | ICD-10-CM

## 2020-05-05 DIAGNOSIS — Z515 Encounter for palliative care: Principal | ICD-10-CM

## 2020-05-06 ENCOUNTER — Encounter: Admit: 2020-05-06 | Discharge: 2020-05-07 | Payer: PRIVATE HEALTH INSURANCE

## 2020-05-06 ENCOUNTER — Other Ambulatory Visit: Admit: 2020-05-06 | Discharge: 2020-05-07 | Payer: PRIVATE HEALTH INSURANCE

## 2020-05-06 ENCOUNTER — Ambulatory Visit
Admit: 2020-05-06 | Discharge: 2020-05-07 | Payer: PRIVATE HEALTH INSURANCE | Attending: Student in an Organized Health Care Education/Training Program | Primary: Student in an Organized Health Care Education/Training Program

## 2020-05-06 ENCOUNTER — Ambulatory Visit: Admit: 2020-05-06 | Discharge: 2020-05-07 | Payer: PRIVATE HEALTH INSURANCE

## 2020-05-06 DIAGNOSIS — C499 Malignant neoplasm of connective and soft tissue, unspecified: Principal | ICD-10-CM

## 2020-05-06 MED ORDER — OXYCODONE 5 MG TABLET
ORAL_TABLET | Freq: Three times a day (TID) | ORAL | 0 refills | 30 days | Status: CP | PRN
Start: 2020-05-06 — End: ?

## 2020-05-07 ENCOUNTER — Ambulatory Visit: Admit: 2020-05-07 | Discharge: 2020-05-07 | Payer: PRIVATE HEALTH INSURANCE

## 2020-05-07 ENCOUNTER — Encounter: Admit: 2020-05-07 | Discharge: 2020-05-07 | Payer: PRIVATE HEALTH INSURANCE

## 2020-05-07 DIAGNOSIS — C499 Malignant neoplasm of connective and soft tissue, unspecified: Principal | ICD-10-CM

## 2020-05-08 DIAGNOSIS — C499 Malignant neoplasm of connective and soft tissue, unspecified: Principal | ICD-10-CM

## 2020-05-12 ENCOUNTER — Encounter
Admit: 2020-05-12 | Discharge: 2020-05-12 | Disposition: A | Discharge status: Home / Self Care | Payer: PRIVATE HEALTH INSURANCE | Attending: Emergency Medicine

## 2020-05-12 MED ORDER — OXYCODONE-ACETAMINOPHEN 5 MG-325 MG TABLET
ORAL_TABLET | Freq: Four times a day (QID) | ORAL | 0 refills | 3.00000 days | Status: CP | PRN
Start: 2020-05-12 — End: 2020-05-15

## 2020-05-14 ENCOUNTER — Ambulatory Visit
Admit: 2020-05-14 | Discharge: 2020-05-14 | Payer: PRIVATE HEALTH INSURANCE | Attending: Student in an Organized Health Care Education/Training Program | Primary: Student in an Organized Health Care Education/Training Program

## 2020-05-14 ENCOUNTER — Other Ambulatory Visit: Admit: 2020-05-14 | Discharge: 2020-05-14 | Payer: PRIVATE HEALTH INSURANCE

## 2020-05-14 DIAGNOSIS — C499 Malignant neoplasm of connective and soft tissue, unspecified: Principal | ICD-10-CM

## 2020-05-14 MED ORDER — STUDY NCI-CT018-10129 OLAPARIB 150 MG TABLET
PACK | Freq: Two times a day (BID) | ORAL | 0 refills | 32 days | Status: CP
Start: 2020-05-14 — End: 2020-06-11

## 2020-05-30 ENCOUNTER — Telehealth: Admit: 2020-05-30 | Discharge: 2020-05-31 | Payer: MEDICAID

## 2020-06-04 ENCOUNTER — Ambulatory Visit: Admit: 2020-06-04 | Payer: PRIVATE HEALTH INSURANCE

## 2020-06-10 ENCOUNTER — Other Ambulatory Visit: Admit: 2020-06-10 | Discharge: 2020-06-11 | Payer: PRIVATE HEALTH INSURANCE

## 2020-06-10 ENCOUNTER — Ambulatory Visit
Admit: 2020-06-10 | Discharge: 2020-06-11 | Payer: PRIVATE HEALTH INSURANCE | Attending: Student in an Organized Health Care Education/Training Program | Primary: Student in an Organized Health Care Education/Training Program

## 2020-06-10 DIAGNOSIS — C499 Malignant neoplasm of connective and soft tissue, unspecified: Principal | ICD-10-CM

## 2020-06-10 MED ORDER — OXYCODONE 5 MG TABLET
ORAL_TABLET | Freq: Three times a day (TID) | ORAL | 0 refills | 30 days | Status: CP | PRN
Start: 2020-06-10 — End: ?

## 2020-06-13 DIAGNOSIS — C499 Malignant neoplasm of connective and soft tissue, unspecified: Principal | ICD-10-CM

## 2020-06-23 ENCOUNTER — Ambulatory Visit: Admit: 2020-06-23 | Discharge: 2020-06-24 | Payer: PRIVATE HEALTH INSURANCE

## 2020-06-23 DIAGNOSIS — C499 Malignant neoplasm of connective and soft tissue, unspecified: Principal | ICD-10-CM

## 2020-06-24 ENCOUNTER — Ambulatory Visit
Admit: 2020-06-24 | Discharge: 2020-06-25 | Payer: PRIVATE HEALTH INSURANCE | Attending: Student in an Organized Health Care Education/Training Program | Primary: Student in an Organized Health Care Education/Training Program

## 2020-06-24 DIAGNOSIS — C499 Malignant neoplasm of connective and soft tissue, unspecified: Principal | ICD-10-CM

## 2020-06-24 MED ORDER — STUDY NCI-CT018-10129 OLAPARIB 100 MG TABLET
PACK | Freq: Two times a day (BID) | ORAL | 0 refills | 32 days | Status: CP
Start: 2020-06-24 — End: 2020-07-22

## 2020-06-24 MED ORDER — STUDY NCI-CT018-10129 OLAPARIB 150 MG TABLET
PACK | Freq: Two times a day (BID) | ORAL | 0 refills | 32 days | Status: CP
Start: 2020-06-24 — End: 2020-07-22

## 2020-06-27 DIAGNOSIS — C499 Malignant neoplasm of connective and soft tissue, unspecified: Principal | ICD-10-CM

## 2020-06-30 ENCOUNTER — Ambulatory Visit
Admit: 2020-06-30 | Discharge: 2020-06-30 | Disposition: A | Discharge status: Home / Self Care | Payer: PRIVATE HEALTH INSURANCE

## 2020-06-30 ENCOUNTER — Emergency Department
Admit: 2020-06-30 | Discharge: 2020-06-30 | Disposition: A | Discharge status: Home / Self Care | Payer: PRIVATE HEALTH INSURANCE

## 2020-07-12 ENCOUNTER — Emergency Department (HOSPITAL_COMMUNITY)
Admission: EM | Admit: 2020-07-12 | Discharge: 2020-07-13 | Disposition: A | Discharge status: Home / Self Care | Payer: Medicaid Other | Attending: Emergency Medicine | Admitting: Emergency Medicine

## 2020-07-12 ENCOUNTER — Encounter (HOSPITAL_COMMUNITY): Payer: Self-pay | Admitting: Emergency Medicine

## 2020-07-12 ENCOUNTER — Other Ambulatory Visit: Payer: Self-pay

## 2020-07-12 DIAGNOSIS — C499 Malignant neoplasm of connective and soft tissue, unspecified: Secondary | ICD-10-CM | POA: Insufficient documentation

## 2020-07-12 DIAGNOSIS — C801 Malignant (primary) neoplasm, unspecified: Secondary | ICD-10-CM | POA: Insufficient documentation

## 2020-07-12 DIAGNOSIS — R109 Unspecified abdominal pain: Secondary | ICD-10-CM | POA: Diagnosis present

## 2020-07-12 DIAGNOSIS — C799 Secondary malignant neoplasm of unspecified site: Secondary | ICD-10-CM

## 2020-07-12 NOTE — ED Triage Notes (Signed)
Pt with L sided flank pain that has been ongoing x " a couple of months". Seen at [facility] 2 weeks ago but was told "nothing wrong". Pt scheduled for a CT scan on 07/22/20.

## 2020-07-13 MED ORDER — OXYCODONE-ACETAMINOPHEN 5-325 MG PO TABS
1.0000 | ORAL_TABLET | Freq: Once | ORAL | Status: AC
  Administered 2020-07-13: 1 via ORAL
  Filled 2020-07-13: qty 1

## 2020-07-13 MED ORDER — PERCOCET 5-325 MG PO TABS: 1.0000 | ORAL_TABLET | Freq: Four times a day (QID) | ORAL | 0 refills | Status: AC | PRN

## 2020-07-13 NOTE — ED Provider Notes (Signed)
[facility] EMERGENCY DEPARTMENT Provider Note   CSN: [id number] Arrival date & time: 07/12/20  2251   Time seen 12:15 AM  History Chief Complaint  Patient presents with  . Flank Pain    Kru Ohern is a 59 y.o. male.  HPI   Patient reports he has had pain in his left flank area off and on for a few months.  He states nothing he does makes it get worse but sometimes leaning forward makes it feel little better.  He states sometimes there is a sharp pain that is brief and then the burning sensation.  He states there was some swelling in that area that went away but has come back.  He denies nausea, vomiting, fever, hematuria, cough, or shortness of breath.  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  He had a lesion on his right forearm and received 20 radiation treatments.  A month later it appeared on his left forearm and since then it has now spread into his lungs and he states most recently had a liver biopsy done because I suspect that it spread to his liver.  He now states he has lesions on both of his thighs.  He states he is currently taking an oral chemotherapy pill.  His next appointment with the oncologist at [facility] is on August 31.  He states nothing is different tonight, he just is frustrated and does not know what is going on.  He was seen in the ED at [facility] on August 9 and had a CT of the abdomen/pelvis at that time.  He states no one discussed the results with him.  Patient is on oxycodone 90 tablets a month for pain.  He ran out on August 20.  He states he was eligible to get a refill however his doctor has not called in his refills yet.  PCP Essig, Hanley Hays, MD    Past Medical History:  Diagnosis Date  . Cancer ([place])   . Pancreatitis 2009  . Panic attacks     Patient Active Problem List   Diagnosis Date Noted  . ANEMIA 03/10/2009    Past Surgical History:  Procedure Laterality Date  . COLONOSCOPY  2009  . KNEE SURGERY Left 1980        Family History  Problem Relation Age of Onset  . Cancer Mother        intestinal cancer  . Diabetes Father   . Kidney disease Father   . Cancer Father        lung cancer  . Diabetes Paternal Aunt     Social History   Tobacco Use  . Smoking status: Never Smoker  . Smokeless tobacco: Never Used  Vaping Use  . Vaping Use: Never used  Substance Use Topics  . Alcohol use: Yes    Alcohol/week: 25.0 standard drinks    Types: 25 Cans of beer per week    Comment: 6 pack beer every other day  . Drug use: Yes    Types: Marijuana    Home Medications Prior to Admission medications   Medication Sig Start Date End Date Taking? Authorizing Provider  lisinopril (ZESTRIL) 20 MG tablet Take 1 tablet (20 mg total) by mouth daily. 12/24/19   Soyla Dryer, PA-C  PERCOCET 5-325 MG tablet Take 1 tablet by mouth every 6 (six) hours as needed for severe pain. 07/13/20   Rolland Porter, MD  silver sulfADIAZINE (SILVADENE) 1 % cream Apply 1 application topically  daily.    [provider]    Allergies    Bee venom, Aspirin, and Morphine and related  Review of Systems   Review of Systems  All other systems reviewed and are negative.   Physical Exam Updated Vital Signs BP 138/61 (BP Location: Right Arm)   Pulse 88   Temp 98.4 F (36.9 C) (Oral)   Resp 18   Ht 6\' 1"  (1.854 m)   Wt 68.9 kg   SpO2 100%   BMI 20.05 kg/m   Physical Exam Vitals and nursing note reviewed.  Constitutional:      Comments: Tall thin male  HENT:     Head: Normocephalic and atraumatic.     Right Ear: External ear normal.     Left Ear: External ear normal.  Eyes:     Extraocular Movements: Extraocular movements intact.     Conjunctiva/sclera: Conjunctivae normal.  Cardiovascular:     Rate and Rhythm: Normal rate.  Pulmonary:     Effort: Pulmonary effort is normal. No respiratory distress.  Musculoskeletal:     Cervical back: Normal range of motion.       Back:     Comments: Patient is  nontender in the thoracic or lumbar spine.  He points to an area in the mid left flank that is tender and there is some fullness that is firm in the soft tissues in that area.  The skin above it is normal without erythema or rash.  When I review his CT scan there is also mention of a possible lesion in the thoracic area when I palpate the paraspinous area of the lower thoracic spine active feel like I can find that lesion but he states it is currently nontender.  Skin:    General: Skin is warm and dry.  Neurological:     General: No focal deficit present.     Mental Status: He is alert and oriented to person, place, and time.     Cranial Nerves: No cranial nerve deficit.     ED Results / Procedures / Treatments   Labs (all labs ordered are listed, but only abnormal results are displayed) Labs Reviewed  URINALYSIS, ROUTINE W REFLEX MICROSCOPIC    EKG None  Radiology No results found.   Interface, Rad Results In - 06/30/2020 1:51 AM EDT  Formatting of this note might be different from the original. CLINICAL DATA: 59 year old male with left side back, flank pain. History of sarcoma.  EXAM: CT ABDOMEN AND PELVIS WITHOUT CONTRAST  TECHNIQUE: Multidetector CT imaging of the abdomen and pelvis was performed following the standard protocol without IV contrast.  COMPARISON: CT Abdomen and Pelvis with contrast 05/12/2020  FINDINGS: Lower chest: Multiple bilateral lower lung metastases redemonstrated. Most of the larger lesions appear smaller and/or more indistinct since June (for example series 4, image 4 today versus series 5 image 1 previously) although an 8 mm nodule in the medial right costophrenic angle was 5-6 mm previously. And a lateral right costophrenic angle nodule on image 14 might also be slightly increased. All of the other visible small lower lung metastases appear stable.  Chronic posterior mediastinal mass inseparable from the descending thoracic aorta is  grossly stable estimated at 38 mm on series 2, image 3 today.  No pericardial or pleural effusion.  Hepatobiliary: Noncontrast liver remains within normal limits. Mild cholelithiasis (coronal image 71) without pericholecystic inflammation.  Pancreas: Stable pancreatic ductal dilatation. Otherwise negative noncontrast appearance.  Spleen: Negative.  Adrenals/Urinary Tract: Bilateral pararenal  metastases are again noted, including the largest which is 37 mm inseparable from the lateral right renal midpole on series 2, image 33. This was 34 mm in June. Normal right adrenal gland. The left adrenal is not identified. No superimposed hydronephrosis. No urinary calculus. Decompressed proximal ureters. Unremarkable urinary bladder.  Stomach/Bowel: No dilated large or small bowel. No pneumoperitoneum. No abdominal free fluid. Evidence of a normal appendix on series 2, image 53. Intermittent diverticulosis of the large bowel.  However, multiple mesenteric metastases are again noted but more difficult to delineate from bowel in the absence of contrast today. Among the largest is that in the left upper quadrant measuring about 41 mm diameter now (series 2, image 26 versus 38 mm previously). Smaller mesenteric metastases are noted on image 44, 58, and appear more stable.  Vascular/Lymphatic: Aortoiliac calcified atherosclerosis. Vascular patency is not evaluated in the absence of IV contrast. Retroperitoneal lymph nodes appear mildly larger since June, but remain normal by size criteria.  A lobulated celiac axis metastasis on series 2, image 23 measuring left roughly 32 mm is stable since June.  Reproductive: Negative.  Other: No pelvic free fluid.  Musculoskeletal: Chronic lumbar spine degeneration. No destructive osseous lesion identified. 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  Left paraspinal metastases at the L4 level (6.4 cm diameter on series 2, image 47) and at the left  hemipelvis piriform (4.9 cm image 68) are stable to mildly increased since June. Better demonstrated with IV contrast at that time is a lower thoracic left paraspinal muscle mass which is on series 2, image 16 today.  IMPRESSION: 1. Widely metastatic sarcoma, with evidence of mild progression since June on this non-contrast exam. 2. No superimposed urinary calculus or obstructive uropathy. 3. Chronic findings including femoral head AVN, aortic atherosclerosis.   Electronically Signed By: Genevie Ann M.D. On: 06/30/2020 01:49        Procedures Procedures (including critical care time)  Medications Ordered in ED Medications  oxyCODONE-acetaminophen (PERCOCET/ROXICET) 5-325 MG per tablet 1 tablet (has no administration in time range)    ED Course  I have reviewed the triage vital signs and the nursing notes.  Pertinent labs & imaging results that were available during my care of the patient were reviewed by me and considered in my medical decision making (see chart for details).    MDM Rules/Calculators/A&P                          I discussed with the patient that I did not feel like we needed to do any further evaluation tonight.  The CT scan that he had done on August 6 explains his symptoms.  Patient becomes very tearful.  He states that nobody tells him anything about his test results.  Patient is cancer patient with metastatic disease, I did give him Percocet and a prescription to get him through the weekend until he can get his medication refilled by his doctor.  Interestingly when I review the [place] there is no entries for him.  We also discussed if he wanted to get a second opinion I would give him the oncologist phone number here to try to set up an appointment.  I will leave that up to him if he wants to follow through with that.   Final Clinical Impression(s) / ED Diagnoses Final diagnoses:  Metastatic malignant neoplasm, unspecified site ([place])    Sarcoma ([place])    Rx / DC Orders ED Discharge  Orders         Ordered    PERCOCET 5-325 MG tablet  Every 6 hours PRN        07/13/20 0033          Plan discharge  Rolland Porter, MD, Barbette Or, MD 07/13/20 [phone]

## 2020-07-13 NOTE — Discharge Instructions (Addendum)
Your CT scan of your abdomen/pelvis done at [facility] on August 9 shows you have the sarcoma that has spread into the muscles that are on the left side of your spine where your pain is located.  You need to discuss this with your cancer doctor.  Keep your appointment on the 31st.  Also have your pharmacist contact the office to get refills of your pain medication.  If you would like a second opinion you can call Dr. Tomie China office that is here at any [facility] to get an appointment.

## 2020-07-14 MED ORDER — OXYCODONE 5 MG TABLET
ORAL_TABLET | Freq: Three times a day (TID) | ORAL | 0 refills | 30 days | Status: CP | PRN
Start: 2020-07-14 — End: ?

## 2020-07-16 MED ORDER — OXYCODONE 5 MG TABLET
ORAL_TABLET | Freq: Three times a day (TID) | ORAL | 0 refills | 30 days | Status: CP | PRN
Start: 2020-07-16 — End: ?

## 2020-07-17 DIAGNOSIS — G8929 Other chronic pain: Principal | ICD-10-CM

## 2020-07-18 DIAGNOSIS — C499 Malignant neoplasm of connective and soft tissue, unspecified: Principal | ICD-10-CM

## 2020-07-21 ENCOUNTER — Ambulatory Visit: Admit: 2020-07-21 | Discharge: 2020-07-21 | Payer: PRIVATE HEALTH INSURANCE

## 2020-07-21 DIAGNOSIS — C499 Malignant neoplasm of connective and soft tissue, unspecified: Principal | ICD-10-CM

## 2020-07-25 ENCOUNTER — Ambulatory Visit: Admit: 2020-07-25 | Discharge: 2020-07-26 | Payer: PRIVATE HEALTH INSURANCE

## 2020-07-29 ENCOUNTER — Other Ambulatory Visit: Admit: 2020-07-29 | Discharge: 2020-07-30 | Payer: PRIVATE HEALTH INSURANCE

## 2020-07-29 ENCOUNTER — Ambulatory Visit
Admit: 2020-07-29 | Discharge: 2020-07-30 | Payer: PRIVATE HEALTH INSURANCE | Attending: Student in an Organized Health Care Education/Training Program | Primary: Student in an Organized Health Care Education/Training Program

## 2020-07-29 DIAGNOSIS — C499 Malignant neoplasm of connective and soft tissue, unspecified: Principal | ICD-10-CM

## 2020-07-30 ENCOUNTER — Ambulatory Visit
Admit: 2020-07-30 | Discharge: 2020-07-31 | Disposition: A | Discharge status: Home / Self Care | Payer: PRIVATE HEALTH INSURANCE

## 2020-07-31 MED ORDER — LANCETS 28 GAUGE: each | 0 refills | 0 days | Status: AC

## 2020-07-31 MED ORDER — BLOOD-GLUCOSE METER KIT WRAPPER: each | 0 refills | 0 days | Status: AC

## 2020-07-31 MED ORDER — BLOOD-GLUCOSE METER KIT WRAPPER
0 refills | 0.00000 days | Status: CP
Start: 2020-07-31 — End: 2021-07-31

## 2020-07-31 MED ORDER — FREESTYLE LITE STRIPS
INTRAMUSCULAR | 0 refills | 0.00000 days | Status: CP
Start: 2020-07-31 — End: ?

## 2020-07-31 MED ORDER — LANCETS 28 GAUGE
0 refills | 0.00000 days | Status: CP
Start: 2020-07-31 — End: ?

## 2020-07-31 MED ORDER — PEN NEEDLE, DIABETIC 31 GAUGE X 1/4" (6 MM)
0 refills | 0.00000 days | Status: CP
Start: 2020-07-31 — End: ?

## 2020-07-31 MED ORDER — LANTUS SOLOSTAR U-100 INSULIN 100 UNIT/ML (3 ML) SUBCUTANEOUS PEN
Freq: Every day | SUBCUTANEOUS | 0 refills | 30 days | Status: CP
Start: 2020-07-31 — End: 2020-08-30

## 2020-07-31 MED ORDER — LANCETS
0 refills | 0.00000 days | Status: CP
Start: 2020-07-31 — End: ?

## 2020-07-31 MED ORDER — BLOOD GLUCOSE TEST STRIPS
ORAL_STRIP | 0 refills | 0 days | Status: CP
Start: 2020-07-31 — End: ?

## 2020-08-01 ENCOUNTER — Telehealth: Admit: 2020-08-01 | Discharge: 2020-08-02 | Payer: PRIVATE HEALTH INSURANCE

## 2020-08-01 DIAGNOSIS — Z09 Encounter for follow-up examination after completed treatment for conditions other than malignant neoplasm: Principal | ICD-10-CM

## 2020-08-01 MED ORDER — INSULIN GLARGINE (U-100) 100 UNIT/ML SUBCUTANEOUS SOLUTION
Freq: Every day | SUBCUTANEOUS | 0 refills | 30 days | Status: CP
Start: 2020-08-01 — End: 2020-07-31

## 2020-08-03 DIAGNOSIS — M7989 Other specified soft tissue disorders: Principal | ICD-10-CM

## 2020-08-06 ENCOUNTER — Ambulatory Visit: Admit: 2020-08-06 | Discharge: 2020-08-07 | Payer: PRIVATE HEALTH INSURANCE

## 2020-08-06 MED ORDER — APIXABAN 5 MG TABLET
ORAL_TABLET | Freq: Two times a day (BID) | ORAL | 3 refills | 90 days | Status: CP
Start: 2020-08-06 — End: ?

## 2020-08-08 ENCOUNTER — Encounter (HOSPITAL_COMMUNITY): Payer: Self-pay

## 2020-08-08 ENCOUNTER — Institutional Professional Consult (permissible substitution): Admit: 2020-08-08 | Discharge: 2020-08-09 | Payer: PRIVATE HEALTH INSURANCE

## 2020-08-08 MED ORDER — METFORMIN ER 500 MG TABLET,EXTENDED RELEASE 24 HR
ORAL_TABLET | Freq: Every day | ORAL | 3 refills | 90.00000 days | Status: CP
Start: 2020-08-08 — End: 2021-08-08

## 2020-08-08 NOTE — Progress Notes (Signed)
I placed an introductory phone call to this patient. I introduced myself and explained my role in the patient's care. Patient expresses no barriers to care but does state he has recently had multiple new diagnoses that he has been dealing with. I provided my contact information to the patient and encouraged him to call with any questions and concerns.

## 2020-08-11 ENCOUNTER — Other Ambulatory Visit: Payer: Self-pay

## 2020-08-11 ENCOUNTER — Inpatient Hospital Stay (HOSPITAL_COMMUNITY): Payer: Medicaid Other | Attending: Hematology | Admitting: Hematology

## 2020-08-11 ENCOUNTER — Encounter (HOSPITAL_COMMUNITY): Payer: Self-pay | Admitting: Hematology

## 2020-08-11 VITALS — BP 111/65 | HR 85 | Temp 96.8°F | Resp 17 | Ht 72.0 in | Wt 150.0 lb

## 2020-08-11 DIAGNOSIS — C499 Malignant neoplasm of connective and soft tissue, unspecified: Principal | ICD-10-CM

## 2020-08-11 DIAGNOSIS — Z85831 Personal history of malignant neoplasm of soft tissue: Principal | ICD-10-CM

## 2020-08-11 DIAGNOSIS — C7801 Secondary malignant neoplasm of right lung: Secondary | ICD-10-CM | POA: Insufficient documentation

## 2020-08-11 DIAGNOSIS — C779 Secondary and unspecified malignant neoplasm of lymph node, unspecified: Secondary | ICD-10-CM | POA: Diagnosis not present

## 2020-08-11 DIAGNOSIS — Z800 Family history of malignant neoplasm of digestive organs: Secondary | ICD-10-CM | POA: Diagnosis not present

## 2020-08-11 DIAGNOSIS — I82621 Acute embolism and thrombosis of deep veins of right upper extremity: Secondary | ICD-10-CM | POA: Insufficient documentation

## 2020-08-11 DIAGNOSIS — R0789 Other chest pain: Secondary | ICD-10-CM | POA: Insufficient documentation

## 2020-08-11 DIAGNOSIS — C4911 Malignant neoplasm of connective and soft tissue of right upper limb, including shoulder: Secondary | ICD-10-CM | POA: Diagnosis not present

## 2020-08-11 DIAGNOSIS — Z77090 Contact with and (suspected) exposure to asbestos: Secondary | ICD-10-CM | POA: Diagnosis not present

## 2020-08-11 DIAGNOSIS — Z7289 Other problems related to lifestyle: Secondary | ICD-10-CM | POA: Insufficient documentation

## 2020-08-11 DIAGNOSIS — C7802 Secondary malignant neoplasm of left lung: Secondary | ICD-10-CM | POA: Diagnosis not present

## 2020-08-11 DIAGNOSIS — M549 Dorsalgia, unspecified: Secondary | ICD-10-CM | POA: Diagnosis not present

## 2020-08-11 DIAGNOSIS — Z801 Family history of malignant neoplasm of trachea, bronchus and lung: Secondary | ICD-10-CM | POA: Diagnosis not present

## 2020-08-11 DIAGNOSIS — Z7901 Long term (current) use of anticoagulants: Secondary | ICD-10-CM | POA: Insufficient documentation

## 2020-08-11 MED ORDER — OXYCODONE HCL 10 MG PO TABS
10.0000 mg | ORAL_TABLET | Freq: Three times a day (TID) | ORAL | 0 refills | Status: AC | PRN
Start: 2020-08-11 — End: ?

## 2020-08-11 MED ORDER — ZIEXTENZO 6 MG/0.6 ML SUBCUTANEOUS SYRINGE
5 refills | 0 days | Status: CP
Start: 2020-08-11 — End: ?

## 2020-08-11 NOTE — Progress Notes (Signed)
[place] [address] [place] [address]   CLINIC:  Medical Oncology/Hematology  CONSULT NOTE  Patient Care Team: Essig, Hanley Hays, MD as PCP - General (Internal Medicine) Dishmon, Garwin Brothers, RN as Oncology Nurse Navigator (Oncology)  CHIEF COMPLAINTS/PURPOSE OF CONSULTATION:  Evaluation of sarcoma  HISTORY OF PRESENTING ILLNESS:  [name] 59 y.o. male is here because of evaluation of sarcoma, at the request of Dr. Rolland Porter from [place]. 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 He was treated with olaparib by Dr. Octavio Manns at [facility] and was planned to start doxorubicin.  Today he reports being in pain, in particular in his left back; he takes oxycodone 5 mg every 3-4 hours for the past 4-5 months. He reports that last year he weighed 185 lbs and once he started olaparib, he dropped down to 145 lbs, but since he stopped taking it, he gained weight to 150 lbs. He was recently diagnosed with diabetes and was started on insulin, but several injections into his right arm has caused blood clots to arise and he was just started on Eliquis. He is scheduled to get a port placed on 9/21 at [facility] and to start the doxorubicin soon after. He denies having any MI's.  His mother had colon cancer; his father had lung cancer. 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  MEDICAL HISTORY:  Past Medical History:  Diagnosis Date  . Cancer ([place])   . Pancreatitis 2009  . Panic attacks     SURGICAL HISTORY: Past Surgical History:  Procedure Laterality Date  . COLONOSCOPY  2009  . KNEE SURGERY Left 1980    SOCIAL HISTORY: Social History   Socioeconomic History  . Marital status: Single    Spouse name: Not on file  . Number of children: Not on file  . Years of education: Not on file  . Highest education level: Not on file    Occupational History  . Occupation: retired  Tobacco Use  . Smoking status: Never Smoker  . Smokeless tobacco: Never Used  Vaping Use  . Vaping Use: Never used  Substance and Sexual Activity  . Alcohol use: Not Currently    Alcohol/week: 25.0 standard drinks    Types: 25 Cans of beer per week    Comment: 6 pack beer every other day  . Drug use: Yes    Types: Marijuana  . Sexual activity: Not on file  Other Topics Concern  . Not on file  Social History Narrative  . Not on file   Social Determinants of Health   Financial Resource Strain: Low Risk   . Difficulty of Paying Living Expenses: Not hard at all  Food Insecurity: No Food Insecurity  . Worried About Charity fundraiser in the Last Year: Never true  . Ran Out of Food in the Last Year: Never true  Transportation Needs: No Transportation Needs  . Lack of Transportation (Medical): No  . Lack of Transportation (Non-Medical): No  Physical Activity: Inactive  . Days of Exercise per Week: 0 days  . Minutes of Exercise per Session: 0 min  Stress: No Stress Concern Present  . Feeling of Stress : Not at all  Social Connections: Unknown  . Frequency of Communication with Friends and Family: More than three times a week  . Frequency of  Social Gatherings with Friends and Family: More than three times a week  . Attends Religious Services: Never  . Active Member of Clubs or Organizations: No  . Attends Archivist Meetings: Never  . Marital Status: Not on file  Intimate Partner Violence: Not At Risk  . Fear of Current or Ex-Partner: No  . Emotionally Abused: No  . Physically Abused: No  . Sexually Abused: No    FAMILY HISTORY: Family History  Problem Relation Age of Onset  . Cancer Mother        intestinal cancer  . Diabetes Father   . Kidney disease Father   . Cancer Father        lung cancer  . Diabetes Paternal Aunt     ALLERGIES:  is allergic to bee venom, aspirin, and morphine and  related.  MEDICATIONS:  Current Outpatient Medications  Medication Sig Dispense Refill  . ACCU-CHEK GUIDE test strip USE 1 STRIP TO CHECK GLUCOSE THREE TIMES DAILY FOR SYMPTOMS OF HIGH OR LOW BLOOD SUGAR    . Blood Glucose Monitoring Suppl (ACCU-CHEK GUIDE ME) w/Device KIT See admin instructions.    Marland Kitchen ELIQUIS 5 MG TABS tablet Take 5 mg by mouth 2 (two) times daily.    . insulin glargine (LANTUS SOLOSTAR) 100 UNIT/ML Solostar Pen Inject into the skin.    . Lancets (FREESTYLE) lancets Test daily before all meals/snacks and once before bedtime.    Marland Kitchen lisinopril (ZESTRIL) 20 MG tablet Take 1 tablet (20 mg total) by mouth daily. 30 tablet 1  . PERCOCET 5-325 MG tablet Take 1 tablet by mouth every 6 (six) hours as needed for severe pain. 5 tablet 0  . RELION PEN NEEDLE 31G/8MM 31G X 8 MM MISC USE 1 ONCE DAILY FOR INSULIN ADMINISTRATION    . silver sulfADIAZINE (SILVADENE) 1 % cream Apply 1 application topically daily.    . sodium fluoride (FLUORISHIELD) 1.1 % GEL dental gel Place onto teeth.     No current facility-administered medications for this visit.    REVIEW OF SYSTEMS:   Review of Systems  Constitutional: Positive for appetite change (mildly decreased) and fatigue (moderate).  HENT:   Positive for trouble swallowing (chewing).   Gastrointestinal: Positive for constipation.  Musculoskeletal: Positive for arthralgias (10/10 R shoulder pain) and back pain (L sided back pain).  Neurological: Positive for numbness (R arm).  Psychiatric/Behavioral: Positive for sleep disturbance.  All other systems reviewed and are negative.    PHYSICAL EXAMINATION: ECOG PERFORMANCE STATUS: 1 - Symptomatic but completely ambulatory  Vitals:   08/11/20 0817  BP: 111/65  Pulse: 85  Resp: 17  Temp: (!) 96.8 F (36 C)  SpO2: 98%   Filed Weights   08/11/20 0817  Weight: 150 lb (68 kg)   Physical Exam Vitals reviewed.  Constitutional:      Appearance: Normal appearance.  Cardiovascular:      Rate and Rhythm: Normal rate and regular rhythm.     Pulses: Normal pulses.     Heart sounds: Normal heart sounds.  Pulmonary:     Effort: Pulmonary effort is normal.     Breath sounds: Normal breath sounds.  Abdominal:     Palpations: Abdomen is soft. There is no hepatomegaly, splenomegaly or mass.     Tenderness: There is no abdominal tenderness.     Hernia: No hernia is present.  Musculoskeletal:     Right upper arm: Swelling (mid-anterior forearm lesion) present.       Legs:  Lymphadenopathy:  Upper Body:     Right upper body: Axillary adenopathy and pectoral adenopathy present.     Lower Body: No right inguinal adenopathy. No left inguinal adenopathy.  Neurological:     General: No focal deficit present.     Mental Status: He is alert and oriented to person, place, and time.  Psychiatric:        Mood and Affect: Mood normal.        Behavior: Behavior normal.      LABORATORY DATA:  I have reviewed the data as listed CBC Latest Ref Rng & Units 06/07/2019 12/28/2017 03/05/2009  WBC 4.0 - 10.5 K/uL 5.0 6.1 7.8  Hemoglobin 13.0 - 17.0 g/dL 14.3 13.0 9.6(L)  Hematocrit 39 - 52 % 43.7 39.0 27.9(L)  Platelets 150 - 400 K/uL 256 282 631(H)   CMP Latest Ref Rng & Units 06/23/2019 06/07/2019 12/28/2017  Glucose 70 - 99 mg/dL - 138(H) 229(H)  BUN 6 - 20 mg/dL - 10 6  Creatinine 0.61 - 1.24 mg/dL 0.70 0.89 0.82  Sodium 135 - 145 mmol/L - 132(L) 134(L)  Potassium 3.5 - 5.1 mmol/L - 4.8 4.6  Chloride 98 - 111 mmol/L - 96(L) 97(L)  CO2 22 - 32 mmol/L - 26 28  Calcium 8.9 - 10.3 mg/dL - 9.7 9.5  Total Protein 6.5 - 8.1 g/dL - 8.4(H) -  Total Bilirubin 0.3 - 1.2 mg/dL - 1.0 -  Alkaline Phos 38 - 126 U/L - 58 -  AST 15 - 41 U/L - 26 -  ALT 0 - 44 U/L - 12 -    RADIOGRAPHIC STUDIES: I have personally reviewed the radiological images as listed and agreed with the findings in the report.  ASSESSMENT:  1.  Metastatic undifferentiated sarcoma of bilateral forearm: Oncologic  timeline: - 2015-2016: Patient first notes small nodule in middle of RIGHT anterior forearm, nonpainful - 06/12/19: Meets with local general surgeon, CT of RUE ordered - 06/15/19: CT Chest w contrast: Right axillary lymphadenopathy largest 3.7cm, multiple solid/subsolid Pulmonary nodules bilaterally with largest 8.15m over right lower lobe. - 06/25/19: [place] [place] of R forearm and axillary masses - Sept 2020: palliative RT to the right forearm (55Gy/251f and R axilla (48Gy/2086ffinished 08/22/2019 - 08/23/2019: STRATA molecular testing: IDH1 pR132C, MS:S, TMB 1mu40mb (low), also detected variants of uncertain significance: MLL3 M1242K (53% VAF), TAF1L K914E (5% VAF), TPR Q1063* (9% VAF) - 10/30/19: MRI LEFT forearm: 6.5 x 3.1 x 3.8 cm mass in left flexor compartment, likely focus of metastatic disease - 12/19/19: Completes palliative XRT to LEFT forearm lesion, 5500cGy with Dr KeviCheral Almas/9/21: CT Chest wo contrast: Decrease in right axillary lymphadenopathy, two new right lower lobe and left upper lobe 0.7 cm pulmonary nodules, increased size of left paratracheal node, and increased size of right subpectoral node compatible with metastatic disease - 04/01/20: CT chest wo contrast: Interval increased size of pulmonary metastasis and multi compartmental mediastinal and right axillary nodal metastasis consistent with disease progression. Interval prominence of upper abdominal lymphadenopathy, concerning for metastatic disease. Recommend CT abdomen and pelvis for better characterization. - 05/06/20: CT Chest wo contrast: Increased burden of metastatic disease including increased size of multiple pulmonary nodules, axillary, retrocrural, mediastinal and gastrohepatic lymphadenopathy. Additionally there are peritoneal masses concerning for metastatic disease. - 05/14/20: C1D1 olaparib per NCI-[id number] (300mg55mBID) -06/10/20: Interruption in start of C2 due to anemia (14.2 -> 8.3) with C1 olaparib,  recheck H&H in 2 weeks - 06/24/20: Start of C2 olaparib, DR  to 239m BID - 07/25/20: CT Chest/Abdomen/Pelvis w contrast: Mixed response, mostly progression, with decrease size of pulmonary nodules and increase in mediastinal, abdominal and pelvic nodal and soft tissue implants.    PLAN:  1.  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 -He is here for second opinion.  Plan was to start him on single agent doxorubicin and add cisplatin if he tolerates well. -Due to the most recent progression on the scans, he is being considered for treatment with doxorubicin/cisplatin. -He has a port placement scheduled tomorrow along with echocardiogram. -I have discussed normal prognosis of metastatic sarcoma and treatment intent in the palliative setting.  He was told to start his treatments as soon as possible given the recent progression on the scans. -He will proceed with his treatments under the direction of Dr. WLiam Graham  I agree with the treatment plan. -I have not given any follow-up appointment.  2.  Uncontrolled pain: -He has pain in the right upper extremity and chest wall. -He was taking oxycodone 5 mg up to 3 times a day.  He was taking more as its not controlling pain for the full 8 hours.  He ran out of the pills. -I have given a prescription for oxycodone 10 mg every 8 hours as needed.  3.  Right upper extremity DVT: -Right upper extremity Doppler on 08/06/2020 showed occlusive thrombus within the mid right brachial vein. -Continue Eliquis.    All questions were answered. The patient knows to call the clinic with any problems, questions or concerns.    SDerek Jack MD, 08/11/20 8:54 AM  [phone]  I, DMilinda Antis am acting as a scribe for Dr. SSanda Linger  I, SDerek JackMD, have reviewed the above  documentation for accuracy and completeness, and I agree with the above.

## 2020-08-11 NOTE — Patient Instructions (Signed)
[place] at [facility] Discharge Instructions  You were seen and examined today by Dr. Delton Coombes. Dr. Delton Coombes is a medical oncologist meaning he specializes in the medical management of cancer. Dr. Delton Coombes discussed your past medical history, family history of cancer and current functional status, including your ability to care for yourself, your appetite and weight loss.  Dr. Delton Coombes agrees with your oncologist, starting chemotherapy is appropriate. There is a mass present where you are reporting left sided flank pain. You should proceed with port placement because that is the safest way to administer chemotherapy.  You are seeing a sarcoma specialist in [facility], you should proceed with the chemotherapy regimen recommended. You may receive chemotherapy here if the distance to [facility] is concerning.    Thank you for choosing [place] at [facility] to provide your oncology and hematology care.  To afford each patient quality time with our provider, please arrive at least 15 minutes before your scheduled appointment time.   If you have a lab appointment with the [facility] please come in thru the Main Entrance and check in at the main information desk.  You need to re-schedule your appointment should you arrive 10 or more minutes late.  We strive to give you quality time with our providers, and arriving late affects you and other patients whose appointments are after yours.  Also, if you no show three or more times for appointments you may be dismissed from the clinic at the providers discretion.     Again, thank you for choosing [facility].  Our hope is that these requests will decrease the amount of time that you wait before being seen by our physicians.       _____________________________________________________________  Should you have questions after your visit to [facility], please contact our  office at [phone] and follow the prompts.  Our office hours are 8:00 a.m. and 4:30 p.m. Monday - Friday.  Please note that voicemails left after 4:00 p.m. may not be returned until the following business day.  We are closed weekends and major holidays.  You do have access to a nurse 24-7, just call the main number to the clinic [phone] and do not press any options, hold on the line and a nurse will answer the phone.    For prescription refill requests, have your pharmacy contact our office and allow 72 hours.    Due to Covid, you will need to wear a mask upon entering the hospital. If you do not have a mask, a mask will be given to you at the Main Entrance upon arrival. For doctor visits, patients may have 1 support person age 17 or older with them. For treatment visits, patients can not have anyone with them due to social distancing guidelines and our immunocompromised population.

## 2020-08-12 ENCOUNTER — Ambulatory Visit
Admit: 2020-08-12 | Discharge: 2020-08-13 | Payer: PRIVATE HEALTH INSURANCE | Attending: Student in an Organized Health Care Education/Training Program | Primary: Student in an Organized Health Care Education/Training Program

## 2020-08-12 ENCOUNTER — Other Ambulatory Visit: Admit: 2020-08-12 | Discharge: 2020-08-13 | Payer: PRIVATE HEALTH INSURANCE

## 2020-08-12 ENCOUNTER — Ambulatory Visit: Admit: 2020-08-12 | Discharge: 2020-08-13 | Payer: PRIVATE HEALTH INSURANCE

## 2020-08-12 ENCOUNTER — Ambulatory Visit: Admit: 2020-08-12 | Discharge: 2020-08-12 | Payer: PRIVATE HEALTH INSURANCE

## 2020-08-12 DIAGNOSIS — C499 Malignant neoplasm of connective and soft tissue, unspecified: Principal | ICD-10-CM

## 2020-08-12 DIAGNOSIS — Z85831 Personal history of malignant neoplasm of soft tissue: Principal | ICD-10-CM

## 2020-08-12 MED ORDER — PROCHLORPERAZINE MALEATE 10 MG TABLET
ORAL_TABLET | Freq: Four times a day (QID) | ORAL | 2 refills | 8 days | Status: CP | PRN
Start: 2020-08-12 — End: ?

## 2020-08-12 MED ORDER — OLANZAPINE 5 MG TABLET
ORAL_TABLET | Freq: Every evening | ORAL | 5 refills | 4 days | Status: CP
Start: 2020-08-12 — End: 2021-08-12

## 2020-08-12 MED ORDER — ONDANSETRON HCL 8 MG TABLET
ORAL_TABLET | Freq: Three times a day (TID) | ORAL | 2 refills | 10 days | Status: CP | PRN
Start: 2020-08-12 — End: ?

## 2020-08-13 ENCOUNTER — Ambulatory Visit: Admit: 2020-08-13 | Discharge: 2020-08-14 | Payer: PRIVATE HEALTH INSURANCE

## 2020-08-14 DIAGNOSIS — C499 Malignant neoplasm of connective and soft tissue, unspecified: Principal | ICD-10-CM

## 2020-08-15 DIAGNOSIS — C499 Malignant neoplasm of connective and soft tissue, unspecified: Principal | ICD-10-CM

## 2020-08-15 MED ORDER — PEGFILGRASTIM-CBQV 6 MG/0.6 ML SUBCUTANEOUS SYRINGE
4 refills | 0 days | Status: CP
Start: 2020-08-15 — End: ?
  Filled 2020-08-28: qty 0.6, 21d supply, fill #0

## 2020-08-18 NOTE — Unmapped (Signed)
[facility] Specialty Medication Onboarding    Specialty Medication: Udenyca 6mg /0.29ml injection  Prior Authorization: Not Required   Financial Assistance: No - copay  <$25  Final Copay/Day Supply: $3 / 21 days    Insurance Restrictions: None     Notes to Pharmacist:     The triage team has completed the benefits investigation and has determined that the patient is able to fill this medication at [facility]. Please contact the patient to complete the onboarding or follow up with the prescribing physician as needed.

## 2020-08-18 NOTE — Unmapped (Signed)
Hem/Onc Phone Triage Note    Caller: Ivan Banks, [phone]    Reason for Call:   Ivan Banks is a 59 y.o. 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    He reports increasing swelling of his R arm over the weekend. The swelling is slightly improved by maintaining his arm elevated. He has sensation over his fingers but has some difficulty closing his fist. He had swelling several weeks ago and was diagnosed with a R brachial DVT for which he has been taking apixaban. He had a R internal jugular port placed placed last week without complications.     Assessment/Plan:   - Advised to maintain arm elevated and placed a pillow underneath while sleeping  - Advised to present to the ED for further evaluation of clot extension or sarcoma if worsening swelling  - I will notify primary Oncology team    Please page Oncology Consults at [phone] if patient needs admission or questions about care occur.     Fellow Taking Call:  Adline Peals?? Sherrie George, MD, PhD  Hematology and Oncology Fellow  August 17, 2020 8:58 PM

## 2020-08-19 NOTE — Unmapped (Signed)
[facility]   Patient Onboarding/Medication Counseling    Patient experiencing pain in back - started late night 08/18/20 and lasted about 5 hours.  Feels like pain is returning and is more severe than before.  Is having a very difficult time tolerating pain but takes oxycodone to help with pain - helps about 5-6 hours before pain returns.     Mr.Loos is a 59 y.o. male with sarcoma who I am counseling today on initiation of therapy.  I am speaking to the patient.    Was a Nurse, learning disability used for this call? No    Verified patient's date of birth / HIPAA.    Specialty medication(s) to be sent: Hematology/Oncology: Greggory Keen    Non-specialty medications/supplies to be sent: none    Medications not needed at this time: none     Udenyca (pegfilgrastim)    Medication & Administration     Dosage: Inject the contents of 1 syringe (0.44mL) under the skin once every 21 days. Inject 24-72 hours after last chemotherapy dose on day 1.    Administration: Inject under the skin of the thigh, abdomen or upper arm. Rotate sites with each injection.  ??? Injection instructions   o Take 1 syringe out of the refrigerator and allow to stand at room temperature for at least 30 minutes  o Wash hands and remove syringe from the tray  o Check the syringe for the following   - Expiration date  - Medication is clear and colorless and free from particles  - It is normal to see 1 or more air bubbles in the syringe and removal of the air is not necessary  - It appears unused or damaged and the needle cap is securely attached  o Choose your injection site (abdomen but not within 2 inches of navel, thigh or if someone else is injecting may also use upper arms or upper outer area of buttocks)  o Clean the injection site with an alcohol wipe using a circular motion and allow to air dry completely  o Pull the needle cap straight off and discard  o Hold the syringe like a dart (just under the finger grips) with your thumb and index finger.  o Pinch the skin and insert the needle at a 45-90 degree angle (keep skin pinched while injecting)  o Push the plunger head down to deliver dose using a slow and constant pressure until the plunger head reaches the bottom and hold syringe in place for 5 seconds  o While the needle is still inserted, slowly move your thumb back, allowing the plunger to rise.  This will release the needle safety guard to safely cover the needle. Then remove the syringe from the injection site.  o If there is blood at the injection site gently press a cotton ball or gauze to the site. Do not rub the injection site.  o Dispose of the used prefilled syringe into a sharps container or hard plastic bottle.    Adherence / Missed Dose Instructions: contact provider    Goals of Therapy     Stimulate the growth of neutrophils (a type of white blood important to fight against infection) used after chemotherapy.    Side Effects & Monitoring Parameters   ??? Injection site irritation  ??? Pain/aching in the bones, arms and legs    The following side effects should be reported to the provider:  ??? Signs of an allergic reaction    Contraindications, Warnings, & Precautions     ???  Hypersensitivity  ??? Hypersensitivity to latex    Drug/Food Interactions     ??? Medication list reviewed in Epic. The patient was instructed to inform the care team before taking any new medications or supplements. No drug interactions identified.     Storage, Handling Precautions, & Disposal     ??? Udenyca should be stored in the refrigerator.   ??? Avoid freezing syringe but if frozen may be thawed one time  ??? Throw away Udenyca syringe that has been left at room temperature for more than 48 hours or frozen more than 1 time  ??? Do not shake the prefilled syringe  ??? Keep out of the reach of children  ??? Place used devices into a sharps container for disposal (which we can supply along with band-aids and alcohol pads) or hard plastic container   Current Medications (including OTC/herbals), Comorbidities and Allergies     Current Outpatient Medications   Medication Sig Dispense Refill   ??? apixaban (ELIQUIS) 5 mg Tab Take 1 tablet (5 mg total) by mouth Two (2) times a day. 180 tablet 3   ??? blood sugar diagnostic (GLUCOSE BLOOD) Strp Use to check blood sugar as directed with insulin 3 times a day & for symptoms of high or low blood sugar. 100 strip 0   ??? blood-glucose meter kit Use as instructed 1 each 0   ??? blood-glucose meter kit Use as instructed 1 each 0   ??? EPINEPHrine (EPIPEN) 0.3 mg/0.3 mL injection Inject 0.3 mL (0.3 mg total) into the muscle once for 1 dose. 0.3 mL 0   ??? fluoride, sodium, (CLINPRO 5000) 1.1 % Pste Apply to teeth daily. Brush on teeth twice daily, do not rinse, spit out excess leave on teeth for at least one hr before eating or drinking 100 mL 5   ??? FREESTYLE LITE STRIPS Strp Test daily before all meals/snacks and once before bedtime. 3 each 0   ??? insulin glargine (LANTUS SOLOSTAR U-100 INSULIN) 100 unit/mL (3 mL) injection pen Inject 0.15 mL (15 Units total) under the skin daily. 4.5 mL 0   ??? lancets (FREESTYLE) 28 gauge Misc Test daily before all meals/snacks and once before bedtime. 100 each 0   ??? lancets (FREESTYLE) 28 gauge Misc Test daily before all meals/snacks and once before bedtime. 3 each 0   ??? lancets Misc Use to check blood sugar as directed with insulin 3 times a day & for symptoms of high or low blood sugar. 100 each 0   ??? lisinopriL (PRINIVIL,ZESTRIL) 20 MG tablet Take 1 tablet (20 mg total) by mouth daily. 90 tablet 2   ??? metFORMIN (GLUCOPHAGE-XR) 500 MG 24 hr tablet Take 1 tablet (500 mg total) by mouth every morning before breakfast. 90 tablet 3   ??? OLANZapine (ZYPREXA) 5 MG tablet Take 1 tablet (5 mg total) by mouth nightly. Take for the first 4 days after chemotherapy 4 tablet 5   ??? ondansetron (ZOFRAN) 4 MG tablet Take 1 tablet (4 mg total) by mouth daily as needed for nausea. 30 tablet 1   ??? ondansetron (ZOFRAN) 8 MG tablet Take 1 tablet (8 mg total) by mouth every eight (8) hours as needed for nausea (or vomiting). 30 tablet 2   ??? oxyCODONE (ROXICODONE) 5 MG immediate release tablet Take 1 tablet (5 mg total) by mouth every eight (8) hours as needed for pain. 90 tablet 0   ??? pegfilgrastim-cbqv (UDENYCA) 6 mg/0.6 mL injection Inject the contents of 1 syringe (0.54mL) under  the skin once every 21 days. Inject 24-72 hours after last chemotherapy dose on day 1. 0.6 mL 4   ??? pen needle, diabetic 31 gauge x 1/4 (6 mm) Ndle Use for insulin administration daily 100 each 0   ??? prochlorperazine (COMPAZINE) 10 MG tablet Take 1 tablet (10 mg total) by mouth every six (6) hours as needed (nausea or vomitting). 30 tablet 2   ??? silver sulfaDIAZINE (SILVADENE, SSD) 1 % cream Apply to affected area daily 50 g 3     No current facility-administered medications for this visit.       Allergies   Allergen Reactions   ??? Venom-Honey Bee Anaphylaxis   ??? Aspirin Other (See Comments)     Stomach pain   ??? Morphine Itching       Patient Active Problem List   Diagnosis   ??? Anemia   ??? Cervical neck pain with evidence of disc disease   ??? Left shoulder pain   ??? Sarcoma (CMS-HCC)   ??? Insomnia due to mental disorder   ??? PTSD (post-traumatic stress disorder)   ??? GAD (generalized anxiety disorder)   ??? Alcohol use disorder, moderate, dependence (CMS-HCC)   ??? Right arm pain   ??? New onset type 1 diabetes mellitus, uncontrolled (CMS-HCC)   ??? Hyperosmolar hyperglycemic state (HHS) (CMS-HCC)   ??? History of sarcoma   ??? Acute deep vein thrombosis (DVT) of brachial vein of right upper extremity (CMS-HCC)   ??? Other chronic pain   ??? Diabetes mellitus (CMS-HCC)       Reviewed and up to date in Epic.    Appropriateness of Therapy     Is medication and dose appropriate based on diagnosis? Yes    Prescription has been clinically reviewed: Yes    Baseline Quality of Life Assessment      How many days over the past month did your sarcoma; chemo induced neutropenia  keep you from your normal activities? For example, brushing your teeth or getting up in the morning. 0    Financial Information     Medication Assistance provided: None Required    Anticipated copay of $3 / 21 days reviewed with patient. Verified delivery address.    Delivery Information     Scheduled delivery date: 08/29/20    Expected start date: 10/13 or 10/14 (Cycle 2 on 09/02/20 and injection due 24-72 hours later)    Medication will be delivered via UPS to the prescription address in [facility]  This shipment will not require a signature.      Explained the services we provide at [facility] and that each month we would call to set up refills.  Stressed importance of returning phone calls so that we could ensure they receive their medications in time each month.  Informed patient that we should be setting up refills 7-10 days prior to when they will run out of medication.  A pharmacist will reach out to perform a clinical assessment periodically.  Informed patient that a welcome packet and a drug information handout will be sent.      Patient verbalized understanding of the above information as well as how to contact the pharmacy at [phone] option 4 with any questions/concerns.  The pharmacy is open Monday through Friday 8:30am-4:30pm.  A pharmacist is available 24/7 via pager to answer any clinical questions they may have.    Patient Specific Needs     - Does the patient have any physical, cognitive, or cultural  barriers? No    - Patient prefers to have medications discussed with  Patient     - Is the patient or caregiver able to read and understand education materials at a high school level or above? Yes    - Patient's primary language is  English     - Is the patient high risk? No    - Does the patient require a Care Management Plan? No     - Does the patient require physician intervention or other additional services (i.e. nutrition, smoking cessation, social work)? No      Loxley Cibrian A [facility] Specialty Pharmacist

## 2020-08-19 NOTE — Unmapped (Signed)
I spoke to Ivan Banks regarding his arm swelling, he states this has gotten worse over the past few days and now has some pitting edema in his hand.  Pain is slightly worse, he describes as tightness from the swelling.  States has been compliant with apixaban and completes his 7th day of therapy today.  No numbness or tingling, denies swelling in upper arm or face / neck.  We discussed that the blood thinner needs more time before his symptoms will improve, will consider repeat PVL if symptoms not improved by next week.  He has a previously scheduled visit related to the olaparib study that he discontinued, will request that this appointment be cancelled and he will follow up with Dr Loree Fee in about 2 weeks for his next cycle of chemotherapy.  He knows to call sooner if issues with the arm.

## 2020-08-20 ENCOUNTER — Emergency Department (HOSPITAL_COMMUNITY): Payer: Medicaid Other

## 2020-08-20 ENCOUNTER — Other Ambulatory Visit: Payer: Self-pay

## 2020-08-20 ENCOUNTER — Encounter (HOSPITAL_COMMUNITY): Payer: Self-pay

## 2020-08-20 ENCOUNTER — Emergency Department (HOSPITAL_COMMUNITY)
Admission: EM | Admit: 2020-08-20 | Discharge: 2020-08-21 | Disposition: A | Discharge status: Home / Self Care | Payer: Medicaid Other | Attending: Emergency Medicine | Admitting: Emergency Medicine

## 2020-08-20 DIAGNOSIS — Z7901 Long term (current) use of anticoagulants: Secondary | ICD-10-CM | POA: Diagnosis not present

## 2020-08-20 DIAGNOSIS — D701 Agranulocytosis secondary to cancer chemotherapy: Secondary | ICD-10-CM | POA: Insufficient documentation

## 2020-08-20 DIAGNOSIS — E139 Other specified diabetes mellitus without complications: Secondary | ICD-10-CM | POA: Insufficient documentation

## 2020-08-20 DIAGNOSIS — C4911 Malignant neoplasm of connective and soft tissue of right upper limb, including shoulder: Secondary | ICD-10-CM | POA: Diagnosis not present

## 2020-08-20 DIAGNOSIS — D630 Anemia in neoplastic disease: Secondary | ICD-10-CM | POA: Diagnosis not present

## 2020-08-20 DIAGNOSIS — Z859 Personal history of malignant neoplasm, unspecified: Secondary | ICD-10-CM | POA: Insufficient documentation

## 2020-08-20 DIAGNOSIS — G893 Neoplasm related pain (acute) (chronic): Secondary | ICD-10-CM | POA: Insufficient documentation

## 2020-08-20 DIAGNOSIS — Z7984 Long term (current) use of oral hypoglycemic drugs: Secondary | ICD-10-CM | POA: Diagnosis not present

## 2020-08-20 DIAGNOSIS — D638 Anemia in other chronic diseases classified elsewhere: Secondary | ICD-10-CM

## 2020-08-20 DIAGNOSIS — I82621 Acute embolism and thrombosis of deep veins of right upper extremity: Secondary | ICD-10-CM | POA: Diagnosis present

## 2020-08-20 LAB — COMPREHENSIVE METABOLIC PANEL
ALT: 8 U/L (ref 0–44)
AST: 13 U/L — ABNORMAL LOW (ref 15–41)
Albumin: 3.4 g/dL — ABNORMAL LOW (ref 3.5–5.0)
Alkaline Phosphatase: 34 U/L — ABNORMAL LOW (ref 38–126)
Anion gap: 8 (ref 5–15)
BUN: 15 mg/dL (ref 6–20)
CO2: 21 mmol/L — ABNORMAL LOW (ref 22–32)
Calcium: 9.9 mg/dL (ref 8.9–10.3)
Chloride: 103 mmol/L (ref 98–111)
Creatinine, Ser: 0.61 mg/dL (ref 0.61–1.24)
GFR calc Af Amer: >60 mL/min (ref >60)
GFR calc non Af Amer: >60 mL/min (ref >60)
Glucose, Bld: 97 mg/dL (ref 70–99)
Potassium: 4.5 mmol/L (ref 3.5–5.1)
Sodium: 132 mmol/L — ABNORMAL LOW (ref 135–145)
Total Bilirubin: 0.8 mg/dL (ref 0.3–1.2)
Total Protein: 7.6 g/dL (ref 6.5–8.1)

## 2020-08-20 LAB — LIPASE, BLOOD: Lipase: 25 U/L (ref 11–51)

## 2020-08-20 LAB — CBC
HCT: 20.9 % — ABNORMAL LOW (ref 39.0–52.0)
Hemoglobin: 6.7 g/dL — CL (ref 13.0–17.0)
MCH: 34.2 pg — ABNORMAL HIGH (ref 26.0–34.0)
MCHC: 32.1 g/dL (ref 30.0–36.0)
MCV: 106.6 fL — ABNORMAL HIGH (ref 80.0–100.0)
Platelets: 157 10*3/uL (ref 150–400)
RBC: 1.96 MIL/uL — ABNORMAL LOW (ref 4.22–5.81)
RDW: 12.7 % (ref 11.5–15.5)
WBC: 1 10*3/uL — CL (ref 4.0–10.5)
nRBC: 0 % (ref 0.0–0.2)

## 2020-08-20 LAB — URINALYSIS, ROUTINE W REFLEX MICROSCOPIC
Bilirubin Urine: NEGATIVE
Glucose, UA: NEGATIVE mg/dL
Hgb urine dipstick: NEGATIVE
Ketones, ur: 20 mg/dL — AB
Leukocytes,Ua: NEGATIVE
Nitrite: NEGATIVE
Protein, ur: NEGATIVE mg/dL
Specific Gravity, Urine: 1.015 (ref 1.005–1.030)
pH: 5 (ref 5.0–8.0)

## 2020-08-20 LAB — PREPARE RBC (CROSSMATCH)

## 2020-08-20 MED ORDER — OXYCODONE-ACETAMINOPHEN 5-325 MG PO TABS
2.0000 | ORAL_TABLET | Freq: Once | ORAL | Status: AC
  Administered 2020-08-21: 2 via ORAL
  Filled 2020-08-20: qty 2

## 2020-08-20 MED ORDER — FENTANYL CITRATE (PF) 100 MCG/2ML IJ SOLN
50.0000 ug | Freq: Once | INTRAMUSCULAR | Status: AC
  Administered 2020-08-20: 50 ug via INTRAVENOUS
  Filled 2020-08-20: qty 2

## 2020-08-20 MED ORDER — SODIUM CHLORIDE 0.9 % IV SOLN
10.0000 mL/h | Freq: Once | INTRAVENOUS | Status: AC
  Administered 2020-08-20: 10 mL/h via INTRAVENOUS

## 2020-08-20 MED ORDER — BISACODYL 5 MG PO TBEC: 5.0000 mg | DELAYED_RELEASE_TABLET | Freq: Every day | ORAL | 0 refills | Status: AC | PRN

## 2020-08-20 MED ORDER — ONDANSETRON HCL 4 MG/2ML IJ SOLN
4.0000 mg | Freq: Once | INTRAMUSCULAR | Status: AC
  Administered 2020-08-20: 4 mg via INTRAVENOUS
  Filled 2020-08-20: qty 2

## 2020-08-20 MED ORDER — FENTANYL CITRATE (PF) 100 MCG/2ML IJ SOLN
100.0000 ug | Freq: Once | INTRAMUSCULAR | Status: AC
  Administered 2020-08-20: 100 ug via INTRAVENOUS
  Filled 2020-08-20: qty 2

## 2020-08-20 MED ORDER — LISINOPRIL 10 MG PO TABS
20.0000 mg | ORAL_TABLET | Freq: Once | ORAL | Status: AC
  Administered 2020-08-20: 20 mg via ORAL
  Filled 2020-08-20: qty 2

## 2020-08-20 MED ORDER — BISACODYL 5 MG PO TBEC
5.0000 mg | DELAYED_RELEASE_TABLET | Freq: Once | ORAL | Status: AC
  Administered 2020-08-20: 5 mg via ORAL
  Filled 2020-08-20: qty 1

## 2020-08-20 MED ORDER — SODIUM CHLORIDE 0.9 % IV BOLUS
1000.0000 mL | Freq: Once | INTRAVENOUS | Status: AC
  Administered 2020-08-20: 1000 mL via INTRAVENOUS

## 2020-08-20 NOTE — ED Notes (Signed)
Date and time results received: 08/20/20 1703 (use smartphrase ".now" to insert current time)  Test: WBC/HGB Critical Value: WBC 1.0/HGB 6.7 Name of Provider Notified: Evalee Jefferson PA  Orders Received? Or Actions Taken?:NA

## 2020-08-20 NOTE — ED Provider Notes (Addendum)
[facility] EMERGENCY DEPARTMENT Provider Note   CSN: [id number] Arrival date & time: 08/20/20  1423     History Chief Complaint  Patient presents with   Abdominal Pain    [name] is a 59 y.o. 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  His pain is sharp and constant.  He denies fevers, chills, n/v, does endorse constipation which has been worse since having his pain medicine bumped up to oxycodone 10 mg. He does endorse generalized weakness. His last bm occurred yesterday and maybe improved his pain slightly.  He denies abdominal distention, also no dysuria.  He reports having a known tumor in his back (left iliac mass and multiple smaller lesion in his peritoneum and mesentery per CT dated 07/25/20 at [facility].) The history is provided by the patient.       Past Medical History:  Diagnosis Date   Cancer ([place])    Pancreatitis 2009   Panic attacks     Patient Active Problem List   Diagnosis Date Noted   ANEMIA 03/10/2009    Past Surgical History:  Procedure Laterality Date   COLONOSCOPY  2009   KNEE SURGERY Left 1980       Family History  Problem Relation Age of Onset   Cancer Mother        intestinal cancer   Diabetes Father    Kidney disease Father    Cancer Father        lung cancer   Diabetes Paternal Aunt     Social History   Tobacco Use   Smoking status: Never Smoker   Smokeless tobacco: Never Used  Vaping Use   Vaping Use: Never used  Substance Use Topics   Alcohol use: Not Currently    Alcohol/week: 25.0 standard drinks    Types: 25 Cans of beer per week    Comment: 6 pack beer every other day   Drug use: Yes    Types: Marijuana    Home  Medications Prior to Admission medications   Medication Sig Start Date End Date Taking? Authorizing Provider  ELIQUIS 5 MG TABS tablet Take 5 mg by mouth 2 (two) times daily. 08/06/20  Yes [provider]  EPINEPHrine 0.3 mg/0.3 mL IJ SOAJ injection Inject 0.3 mLs into the muscle daily as needed. 03/26/15  Yes [provider]  insulin glargine (LANTUS SOLOSTAR) 100 UNIT/ML Solostar Pen Inject 10 Units into the skin daily.  07/31/20 08/30/20 Yes [provider]  lisinopril (ZESTRIL) 20 MG tablet Take 1 tablet (20 mg total) by mouth daily. 12/24/19  Yes Soyla Dryer, PA-C  metFORMIN (GLUCOPHAGE-XR) 500 MG 24 hr tablet Take 500 mg by mouth every morning. 08/08/20  Yes [provider]  OLANZapine (ZYPREXA) 5 MG tablet Take 1 tablet by mouth every evening. 08/12/20 08/12/21 Yes [provider]  oxyCODONE 10 MG TABS Take 1 tablet (10 mg total) by mouth every 8 (eight) hours as needed for severe pain. 08/11/20  Yes Derek Jack, MD  PERCOCET 5-325 MG tablet Take 1 tablet by mouth every 6 (six) hours as needed for severe pain. 07/13/20  Yes Rolland Porter, MD  silver sulfADIAZINE (SILVADENE) 1 % cream Apply 1 application topically daily.   Yes [provider]  sodium fluoride (FLUORISHIELD) 1.1 % GEL dental gel Place onto teeth. 07/07/20  Yes [provider]  ACCU-CHEK GUIDE test strip USE 1 STRIP TO CHECK GLUCOSE THREE TIMES DAILY FOR SYMPTOMS OF HIGH OR LOW BLOOD SUGAR 07/31/20   [provider]  bisacodyl (DULCOLAX) 5 MG EC tablet Take 1 tablet (5 mg total) by mouth daily as needed for moderate constipation. 08/20/20   Evalee Jefferson, PA-C  Blood Glucose Monitoring Suppl (ACCU-CHEK GUIDE ME) w/Device KIT See admin instructions. 07/31/20   [provider]  Lancets (FREESTYLE) lancets Test daily before all meals/snacks and once before bedtime. 07/31/20   [provider]  RELION PEN NEEDLE 31G/8MM 31G X 8 MM MISC USE 1 ONCE DAILY FOR  INSULIN ADMINISTRATION 07/31/20   [provider]    Allergies    Bee venom, Aspirin, and Morphine and related  Review of Systems   Review of Systems  Constitutional: Positive for fatigue. Negative for chills and fever.  HENT: Negative for congestion and sore throat.   Eyes: Negative.   Respiratory: Negative for chest tightness and shortness of breath.   Cardiovascular: Negative for chest pain.  Gastrointestinal: Positive for abdominal pain. Negative for nausea and vomiting.  Genitourinary: Negative.   Musculoskeletal: Positive for back pain. Negative for arthralgias, joint swelling and neck pain.  Skin: Negative.  Negative for rash and wound.  Neurological: Positive for weakness. Negative for dizziness, light-headedness, numbness and headaches.  Psychiatric/Behavioral: Negative.     Physical Exam Updated Vital Signs BP 133/66    Pulse 82    Temp 98.3 F (36.8 C) (Oral)    Resp 18    Ht _0  (1.854 m)    Wt 68 kg    SpO2 100%    BMI 19.79 kg/m   Physical Exam Vitals and nursing note reviewed.  Constitutional:      General: He is not in acute distress.    Appearance: He is well-developed.  HENT:     Head: Normocephalic and atraumatic.  Eyes:     Conjunctiva/sclera: Conjunctivae normal.  Cardiovascular:     Rate and Rhythm: Normal rate and regular rhythm.     Heart sounds: Normal heart sounds.  Pulmonary:     Effort: Pulmonary effort is normal.     Breath sounds: Normal breath sounds. No wheezing.  Abdominal:     General: Abdomen is flat. Bowel sounds are normal. There is no distension or abdominal bruit.     Palpations: Abdomen is soft. There is no pulsatile mass.     Tenderness: There is no abdominal tenderness. There is no right CVA tenderness, left CVA tenderness, guarding or rebound.     Comments: Solid nodule palpable left lower back, mildly tender.  No erythema, no rash.   Musculoskeletal:        General: Normal range of motion.     Cervical back: Normal  range of motion.  Skin:    General: Skin is warm and dry.     Capillary Refill: Capillary refill takes less than 2 seconds.  Neurological:     General: No focal deficit present.     Mental Status: He is alert and oriented to person, place, and time.     ED Results / Procedures / Treatments   Labs (all labs ordered are listed, but only abnormal results are displayed) Labs Reviewed  COMPREHENSIVE METABOLIC PANEL - Abnormal; Notable for the following components:      Result Value   Sodium 132 (*)  CO2 21 (*)    Albumin 3.4 (*)    AST 13 (*)    Alkaline Phosphatase 34 (*)    All other components within normal limits  CBC - Abnormal; Notable for the following components:   WBC 1.0 (*)    RBC 1.96 (*)    Hemoglobin 6.7 (*)    HCT 20.9 (*)    MCV 106.6 (*)    MCH 34.2 (*)    All other components within normal limits  URINALYSIS, ROUTINE W REFLEX MICROSCOPIC - Abnormal; Notable for the following components:   Ketones, ur 20 (*)    All other components within normal limits  LIPASE, BLOOD  PREPARE RBC (CROSSMATCH)  TYPE AND SCREEN    EKG EKG Interpretation  Date/Time:  Wednesday August 20 2020 17:10:48 EDT Ventricular Rate:  94 PR Interval:    QRS Duration: 140 QT Interval:  417 QTC Calculation: 522 R Axis:   -71 Text Interpretation: Sinus rhythm RBBB and LAFB Baseline wander in lead(s) II aVF V5 No old tracing to compare Confirmed by Daleen Bo [phone]) on 08/20/2020 5:15:10 PM   Radiology DG ABD ACUTE 2+V W 1V CHEST  Result Date: 08/20/2020 CLINICAL DATA:  Back and abdominal pain, constipation, nausea and vomiting, recently began chemotherapy EXAM: ACUTE ABDOMEN SERIES (2 VIEW ABDOMEN AND 1 VIEW CHEST) COMPARISON:  CT chest 06/14/2019, CT chest, abdomen and pelvis 02/21/2009 FINDINGS: Accessed right IJ approach Port-A-Cath tip terminates at the superior cavoatrial junction. Reticulonodular opacities are present in the lung bases possibly reflecting the  emphysematous change in multiple ill-defined solid and subsolid nodules seen on the comparison CT. No new consolidative opacity. No pneumothorax or effusion. The cardiomediastinal contours are unremarkable. No subdiaphragmatic free air. No high-grade obstructive bowel gas pattern is seen. Few punctate radiodensities project over the right renal shadow though may be vascular or related to bowel material given similar scattered punctate radiodensities elsewhere in the abdomen. No other suspicious calcifications. Multilevel degenerative changes present in the shoulders, spine, hips and pelvis. 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 IMPRESSION: 1. Reticulonodular opacities in the lung bases possibly reflecting the emphysematous change and multiple ill-defined solid and subsolid nodules seen on the comparison CT. 2. No high-grade obstructive bowel gas pattern. No free air. 3. Few punctate radiodensities project over the right renal shadow though may be nephrolithiasis, vascular or related to bowel material. Electronically Signed   By: Lovena Le M.D.   On: 08/20/2020 19:33    Procedures Procedures (including critical care time)  CRITICAL CARE Performed by: Evalee Jefferson Total critical care time: 50 minutes Critical care time was exclusive of separately billable procedures and treating other patients. Critical care was necessary to treat or prevent imminent or life-threatening deterioration. Critical care was time spent personally by me on the following activities: development of treatment plan with patient and/or surrogate as well as nursing, discussions with consultants, evaluation of patient's response to treatment, examination of patient, obtaining history from patient or surrogate, ordering and performing treatments and interventions, ordering and review of laboratory studies, ordering and review of radiographic studies, pulse oximetry and re-evaluation of patient's  condition.   Medications Ordered in ED Medications  bisacodyl (DULCOLAX) EC tablet 5 mg (has no administration in time range)  sodium chloride 0.9 % bolus 1,000 mL (0 mLs Intravenous Stopped 08/20/20 1959)  fentaNYL (SUBLIMAZE) injection 50 mcg (50 mcg Intravenous Given 08/20/20 1802)  ondansetron (ZOFRAN) injection 4 mg (4 mg Intravenous Given 08/20/20 1802)  fentaNYL (SUBLIMAZE) injection 100 mcg (100 mcg Intravenous Given 08/20/20 1946)  0.9 %  sodium chloride infusion (10 mL/hr Intravenous New Bag/Given 08/20/20 2218)  lisinopril (ZESTRIL) tablet 20 mg (20 mg Oral Given 08/20/20 2102)    ED Course  I have reviewed the triage vital signs and the nursing notes.  Pertinent labs & imaging results that were available during my care of the patient were reviewed by me and considered in my medical decision making (see chart for details).    MDM Rules/Calculators/A&P                          Labs and imaging reviewed and discussed with pt and family member at bedside.  Significant anemia and leukopenia.  He is afebrile.  Discussed with Dr. Claiborne Billings with oncology [facility] - recommends transfusion of prbc's. Given recent chemo, leukopenia expected, no further eval as he is afebrile. He was ordered 2 units of prbc's, after which will plan dc home. He was recently prescribed oxycodone 10 mg q 8 hours for his cancer pain.  Advised increasing dosing to q 6 hours.    Final Clinical Impression(s) / ED Diagnoses Final diagnoses:  Cancer-related pain  Anemia in other chronic diseases classified elsewhere  Chemotherapy-induced neutropenia ([place])    Rx / DC Orders ED Discharge Orders         Ordered    bisacodyl (DULCOLAX) 5 MG EC tablet  Daily PRN        08/20/20 2347           Evalee Jefferson, PA-C 08/20/20 2350    Ripley Fraise, MD 08/21/20 0141    Evalee Jefferson, PA-C 09/10/20 1453    Daleen Bo, MD 09/10/20 2322

## 2020-08-20 NOTE — ED Notes (Signed)
Pt given meal tray.

## 2020-08-20 NOTE — Discharge Instructions (Addendum)
Make sure you are protecting yourself, wearing your mask when around others and avoiding anyone who is ill while your white blood cell count is low. You have received blood transfusions here which should help you with fatigue.  I recommend increasing the frequency of your pain medicine, taking this every 6 hours for better pain control.  Pain medicine will cause constipation, so taking a stool softener is recommended and has been prescribed.  You have also been given some information about your newly diagnosed diabetes, but keep your appointment for your diabetes class.  Call Dr. Liam Graham as needed for any problems or concerns.

## 2020-08-20 NOTE — ED Triage Notes (Signed)
Pt presents to ED with complaints of left side abdominal pain and back pain. Pt states he has a tumor in his back. Pt denies N/V/D. Pt states pain started yesterday.

## 2020-08-21 NOTE — Unmapped (Signed)
Received call from [facility] regarding patient Ivan Banks    Ivan Banks is a 59 yo patient with metastatic sarcoma currently C1D9 of doxorubicin. He is presenting today with worsening back pain. He was noted to have worsening cytopenias (Hgb 6.9, WBC 1).     I discussed that I am unable to given consultative advice regarding disposition on patients I am not directly evaluating, but explained that generally speaking, we can see cytopenias at this time point following chemotherapy. Discussed importance of monitoring for fever (patient afebrile) & that our general transfusion threshold is 7.0. Provider is monitoring response to pain medications and planning to obtain additional imaging. Stated that if patient is admitted and local providers do not feel they can manage, may contact the transfer center. I will notify outpatient team.     Wendie Simmer, MD  Heme/Onc Fellow

## 2020-08-22 ENCOUNTER — Other Ambulatory Visit (HOSPITAL_COMMUNITY): Payer: Self-pay

## 2020-08-22 DIAGNOSIS — C499 Malignant neoplasm of connective and soft tissue, unspecified: Secondary | ICD-10-CM

## 2020-08-22 LAB — BPAM RBC
Blood Product Expiration Date: 202110292359
Blood Product Expiration Date: 202110292359
ISSUE DATE / TIME: 202109292201
ISSUE DATE / TIME: 202109300000
Unit Type and Rh: 5100
Unit Type and Rh: 5100

## 2020-08-22 LAB — TYPE AND SCREEN
ABO/RH(D): O POS
Antibody Screen: NEGATIVE
Unit division: 0
Unit division: 0

## 2020-08-25 NOTE — Telephone Encounter (Signed)
Patient informed to contact [facility] oncology for refill of his oxycodone. He verbalized understanding and agreement.

## 2020-08-25 NOTE — Unmapped (Signed)
Hi,     Ivan Banks contacted the Communication Center requesting to speak with the care team of Ivan Banks to discuss:    Patient was seen at [facility] for a transfusion. Providers there wanted patient to check with Dr. Dorna Mai to see if he would approve them prescribing a refill for his pain medication.    Please contact [name] at [phone].    Thank you,   Kelli Hope  [facility] Cancer Communication Center   [phone]

## 2020-08-25 NOTE — Unmapped (Signed)
Hi,     Dock Hansley contacted the Communication Center regarding the following:    - Patient returned a missed call that he received from nurse Aram Beecham. Message was relayed to patient that Dr. Dorna Mai approved Dr. Ellin Saba to prescribed the medication for pain meds. Lanny says that a call from [facility] care team will need to be placed to Dr. Ellin Saba.    Please contact Eliaz Raska at [phone].    Thanks in advance,    Jannette Spanner  [facility] Cancer Communication Center   [phone]

## 2020-08-25 NOTE — Unmapped (Signed)
Returned call to pt. He was calling to see if Dr. Dorna Mai was ok with Dr. Ellin Saba at [facility] prescribing his pain meds? He was prescribed oxycodone 10 mg every 8 hrs on 08-11-20. He received 42 tabs.    He also wanted to let team know he continues to have swelling in right arm/hand. Denies numbness or tingling. If he does not keep it elevated all the time the swelling returns.Continues to take eliquis as directed.      Let him know I will notify team of these issues and let him know their recommendations.

## 2020-08-25 NOTE — Unmapped (Signed)
Hi,     [facility] contacted the Communication Center requesting to speak with the care team of Ivan Banks to discuss:    Received a follow up from Dr. Marice Potter office. Patient misunderstood, they want oncology team to prescribe pain medcation.    Thank you,   Kelli Hope  [facility] Cancer Communication Center   [phone]

## 2020-08-25 NOTE — Unmapped (Signed)
Attempted to follow up with pt. No answer or V/M available.

## 2020-08-25 NOTE — Unmapped (Signed)
Called pt to let him know I called and left a message with Dr. Marice Potter office that pt may receive his scripts for pain med from them.Ask them to call back with any further questions.

## 2020-08-27 MED ORDER — OXYCODONE ER 10 MG TABLET,CRUSH RESISTANT,EXTENDED RELEASE 12 HR
ORAL_TABLET | Freq: Two times a day (BID) | ORAL | 0 refills | 30 days | Status: CP
Start: 2020-08-27 — End: 2020-09-26

## 2020-08-27 MED ORDER — OXYCODONE 5 MG TABLET
ORAL_TABLET | Freq: Three times a day (TID) | ORAL | 0 refills | 30 days | Status: CP | PRN
Start: 2020-08-27 — End: ?

## 2020-08-27 NOTE — Unmapped (Signed)
I called and spoke to patient about his pain medication.  His pain was controlled by the oxycodone 10mg  dosing prescribed by Dr Ellin Saba but would only last 5-6 hours before needing another dose.  We discussed switching regimen to long-acting 10mg  Q12H with 5mg  oxycodone PRN q8h for breakthrough pain.     Patient did not contact office for refill after using his 07/16/2020 #90 oxycodone 5mg  fill, and instead obtain a bridge fill from a local oncologist (Dr Ellin Saba), at twice his initial dose.  We reviewed importance of contacting my office in advance when requiring refills, and that seeking fills through outside MDs is not an acceptable substitute for requesting timely refills and may compromise my ability to treat his pain properly.  Patient voiced understanding and agreement with plan above.

## 2020-08-27 NOTE — Unmapped (Addendum)
Hi,    Patient Ivan Banks called requesting a medication refill for the following:    ??? Medication: roxicodone  ??? Dosage: 10mg   ??? Days left of medication: 0  ??? Pharmacy: [facility]    I'm in pain. I don't know why they're giving me the run around. I don't know how much longer I have to live. They can at least make sure I'm comfortable.    The expected turnaround time is 3-4 business days     Check Indicates criteria has been reviewed and confirmed with the patient:    [x]  Preferred Name   [x]  DOB and/or MR#  [x]  Preferred Contact Method  [x]  Phone Number(s)   [x]  Preferred Pharmacy   []  MyChart     Thank you,  Laverna Peace  [facility] Cancer Communication Center  [phone]

## 2020-08-27 NOTE — Unmapped (Signed)
Addended by: [name] on: 08/27/2020 01:14 PM     Modules accepted: Orders

## 2020-08-28 DIAGNOSIS — C499 Malignant neoplasm of connective and soft tissue, unspecified: Principal | ICD-10-CM

## 2020-08-28 DIAGNOSIS — G893 Neoplasm related pain (acute) (chronic): Principal | ICD-10-CM

## 2020-08-28 MED FILL — UDENYCA 6 MG/0.6 ML SUBCUTANEOUS SYRINGE: 21 days supply | Qty: 1 | Fill #0 | Status: AC

## 2020-08-28 NOTE — Unmapped (Signed)
Hi,     Patient contacted the Communication Center requesting to speak with the care team of Ivan Banks to discuss:    [facility] didn't give me my Oxycodone 10mg  because of my insurance. I slept on a heating pad all night last night and I was just hollering. It feels like somebody is sticking a knife in my back. I hope they can get the insurance straight so I can pick up the prescription. Ever since I had the blood transfusion I've been eating better. I've never drank coffee before, but I'm drinking it now. I start cooking breakfast around 6am. Other than the pain in my back, I've been doing pretty good.    Please contact patient at [phone].    Check Indicates criteria has been reviewed and confirmed with the patient:    [x]  Preferred Name   [x]  DOB and/or MR#  [x]  Preferred Contact Method  [x]  Phone Number(s)   []  MyChart     Thank you,   Laverna Peace  [facility] Cancer Communication Center   [phone]

## 2020-08-29 MED ORDER — OXYCODONE ER 9 MG CAPSULE SPRINKLE EXTENDED RELEASE 12 HR(DON'T CRUSH): 9 mg | each | Freq: Two times a day (BID) | 0 refills | 30 days | Status: AC

## 2020-08-29 MED ORDER — OXYCODONE ER 9 MG CAPSULE SPRINKLE EXTENDED RELEASE 12 HR(DON'T CRUSH)
Freq: Two times a day (BID) | ORAL | 0 refills | 30.00000 days | Status: CP
Start: 2020-08-29 — End: 2020-09-28

## 2020-08-29 NOTE — Unmapped (Signed)
Addended by: [name] on: 08/29/2020 12:34 PM     Modules accepted: Orders

## 2020-08-29 NOTE — Unmapped (Signed)
I resent the oxycodone myristate 9mg  script to [facility] as requested

## 2020-08-29 NOTE — Unmapped (Signed)
Addended by: [name] on: 08/29/2020 02:52 PM     Modules accepted: Orders

## 2020-08-29 NOTE — Unmapped (Addendum)
Hi,     Patient contacted the Communication Center requesting to speak with the care team of Semaje Claud Kelp to discuss:    Patient says [facility] does not have oxycodone myristate 9mg  CSpT. Patient would like script sent to [facility]. I'm here now, if they can go ahead and send it. I'm going to need it over the weekend.    I added pharmacy to patient's chart.    Please contact patient at [phone].    Check Indicates criteria has been reviewed and confirmed with the patient:    [x]  Preferred Name   [x]  DOB and/or MR#  [x]  Preferred Contact Method  [x]  Phone Number(s)   []  MyChart     Thank you,   Laverna Peace  [facility] Cancer Communication Center   [phone]

## 2020-08-29 NOTE — Unmapped (Signed)
Addended by: [name] on: 08/29/2020 12:17 PM     Modules accepted: Orders

## 2020-08-29 NOTE — Unmapped (Signed)
Returned pt's call. He states he is there now to pick it up.Will call back with any further needs.

## 2020-09-02 ENCOUNTER — Ambulatory Visit: Admit: 2020-09-02 | Discharge: 2020-09-03 | Payer: PRIVATE HEALTH INSURANCE

## 2020-09-02 ENCOUNTER — Ambulatory Visit
Admit: 2020-09-02 | Discharge: 2020-09-03 | Payer: PRIVATE HEALTH INSURANCE | Attending: Hematology & Oncology | Primary: Hematology & Oncology

## 2020-09-02 ENCOUNTER — Other Ambulatory Visit: Admit: 2020-09-02 | Discharge: 2020-09-03 | Payer: PRIVATE HEALTH INSURANCE

## 2020-09-02 DIAGNOSIS — G893 Neoplasm related pain (acute) (chronic): Principal | ICD-10-CM

## 2020-09-02 DIAGNOSIS — C499 Malignant neoplasm of connective and soft tissue, unspecified: Principal | ICD-10-CM

## 2020-09-02 DIAGNOSIS — K5903 Drug induced constipation: Secondary | ICD-10-CM

## 2020-09-02 DIAGNOSIS — Z85831 Personal history of malignant neoplasm of soft tissue: Principal | ICD-10-CM

## 2020-09-02 DIAGNOSIS — D539 Nutritional anemia, unspecified: Principal | ICD-10-CM

## 2020-09-02 DIAGNOSIS — T402X5A Adverse effect of other opioids, initial encounter: Secondary | ICD-10-CM

## 2020-09-02 LAB — VITAMIN B-12: Cobalamins:MCnc:Pt:Ser/Plas:Qn:: 1429 — ABNORMAL HIGH

## 2020-09-02 LAB — COMPREHENSIVE METABOLIC PANEL
ALBUMIN: 3.1 g/dL — ABNORMAL LOW (ref 3.4–5.0)
ALKALINE PHOSPHATASE: 96 U/L (ref 46–116)
ALT (SGPT): 18 U/L (ref 10–49)
ANION GAP: 4 mmol/L — ABNORMAL LOW (ref 5–14)
AST (SGOT): 33 U/L (ref <=34)
BILIRUBIN TOTAL: 0.2 mg/dL — ABNORMAL LOW (ref 0.3–1.2)
BUN / CREAT RATIO: 21
CHLORIDE: 105 mmol/L (ref 98–107)
CO2: 24 mmol/L (ref 20.0–31.0)
CREATININE: 0.78 mg/dL
EGFR CKD-EPI AA MALE: >90 mL/min/{1.73_m2} (ref >=60)
GLUCOSE RANDOM: 178 mg/dL (ref 70–179)
POTASSIUM: 3.7 mmol/L (ref 3.4–4.5)
PROTEIN TOTAL: 7.2 g/dL (ref 5.7–8.2)
SODIUM: 133 mmol/L — ABNORMAL LOW (ref 135–145)

## 2020-09-02 LAB — CBC W/ AUTO DIFF
BASOPHILS ABSOLUTE COUNT: 0 10*9/L (ref 0.0–0.1)
EOSINOPHILS ABSOLUTE COUNT: 0 10*9/L (ref 0.0–0.4)
EOSINOPHILS RELATIVE PERCENT: 0.4 %
HEMATOCRIT: 24.9 % — ABNORMAL LOW (ref 41.0–53.0)
HEMOGLOBIN: 8.1 g/dL — ABNORMAL LOW (ref 13.5–17.5)
LYMPHOCYTES ABSOLUTE COUNT: 1 10*9/L — ABNORMAL LOW (ref 1.5–5.0)
LYMPHOCYTES RELATIVE PERCENT: 12.3 %
MEAN CORPUSCULAR HEMOGLOBIN CONC: 32.4 g/dL (ref 31.0–37.0)
MEAN CORPUSCULAR HEMOGLOBIN: 34.7 pg — ABNORMAL HIGH (ref 26.0–34.0)
MEAN CORPUSCULAR VOLUME: 107.3 fL — ABNORMAL HIGH (ref 80.0–100.0)
MEAN PLATELET VOLUME: 9.6 fL (ref 7.0–10.0)
MONOCYTES ABSOLUTE COUNT: 0.4 10*9/L (ref 0.2–0.8)
MONOCYTES RELATIVE PERCENT: 4.8 %
NEUTROPHILS ABSOLUTE COUNT: 6.8 10*9/L (ref 2.0–7.5)
NEUTROPHILS RELATIVE PERCENT: 80.3 %
PLATELET COUNT: 368 10*9/L (ref 150–440)
RED BLOOD CELL COUNT: 2.32 10*12/L — ABNORMAL LOW (ref 4.50–5.90)
RED CELL DISTRIBUTION WIDTH: 16.5 % — ABNORMAL HIGH (ref 12.0–15.0)

## 2020-09-02 LAB — RED BLOOD CELL COUNT: Lab: 2.32 — ABNORMAL LOW

## 2020-09-02 LAB — FOLATE: Folate:MCnc:Pt:Ser/Plas:Qn:: 16.5

## 2020-09-02 LAB — PROTEIN TOTAL: Protein:MCnc:Pt:Ser/Plas:Qn:: 7.2

## 2020-09-02 LAB — THYROID STIMULATING HORMONE: Thyrotropin:ACnc:Pt:Ser/Plas:Qn:: 1.299

## 2020-09-02 LAB — SMEAR REVIEW

## 2020-09-02 MED ORDER — SENNOSIDES 8.6 MG TABLET
ORAL_TABLET | Freq: Two times a day (BID) | ORAL | 0 refills | 30.00000 days | Status: CP | PRN
Start: 2020-09-02 — End: 2020-10-02

## 2020-09-02 MED ORDER — POLYETHYLENE GLYCOL 3350 17 GRAM/DOSE ORAL POWDER
Freq: Two times a day (BID) | ORAL | 0 refills | 25 days | Status: CP | PRN
Start: 2020-09-02 — End: ?

## 2020-09-02 MED ORDER — XTAMPZA ER 18 MG CAPSULE SPRINKLE
Freq: Two times a day (BID) | ORAL | 0 refills | 30.00000 days | Status: CP
Start: 2020-09-02 — End: 2020-10-02

## 2020-09-02 MED ORDER — GABAPENTIN 100 MG CAPSULE
ORAL_CAPSULE | Freq: Every evening | ORAL | 0 refills | 270.00000 days | Status: CP
Start: 2020-09-02 — End: ?

## 2020-09-02 MED ORDER — OXYCODONE 10 MG TABLET
ORAL_TABLET | Freq: Three times a day (TID) | ORAL | 0 refills | 30.00000 days | Status: CP | PRN
Start: 2020-09-02 — End: ?

## 2020-09-02 MED ADMIN — dexAMETHasone (DECADRON) tablet 12 mg: 12 mg | ORAL | @ 21:00:00 | Stop: 2020-09-02

## 2020-09-02 MED ADMIN — OLANZapine (ZYPREXA) tablet 5 mg: 5 mg | ORAL | @ 21:00:00 | Stop: 2020-09-02

## 2020-09-02 MED ADMIN — fosaprepitant (EMEND) 150 mg in sodium chloride (NS) 0.9 % 100 mL IVPB: 150 mg | INTRAVENOUS | @ 21:00:00 | Stop: 2020-09-02

## 2020-09-02 MED ADMIN — heparin, porcine (PF) 100 unit/mL injection 500 Units: 500 [IU] | INTRAVENOUS | @ 22:00:00 | Stop: 2020-09-03

## 2020-09-02 MED ADMIN — ondansetron (ZOFRAN) tablet 16 mg: 16 mg | ORAL | @ 21:00:00 | Stop: 2020-09-02

## 2020-09-02 MED ADMIN — sodium chloride (NS) 0.9 % infusion: 100 mL/h | INTRAVENOUS | @ 21:00:00

## 2020-09-02 MED ADMIN — DOXOrubicin (ADRIAMYCIN) syringe: 75 mg/m2 | INTRAVENOUS | @ 22:00:00 | Stop: 2020-09-02

## 2020-09-02 NOTE — Unmapped (Signed)
OUTPATIENT ONCOLOGY PALLIATIVE CARE    Principal Diagnosis: [name] is a 59 y.o. 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     Assessment/Plan:   1.  Left lower back pain and right axilla pain due to sarcomas-appears to have a neuropathic component to his pain.  -Increase Xtampza ER 9 mg twice daily TO Xtampza ER 18 mg twice daily.  -Increase oxycodone 5 mg every 8 hours TO oxycodone 10 mg every 8 hours as needed  -Start gabapentin 100 mg x 2 nights then if tolerating increase to 300 mg every night.      2.  Potential for opioid-induced constipation  -Recommended to hold Dulcolax and start MiraLAX up to 3 times per day as needed.  -If this is not sufficient added Senokot 2 tablets at at bedtime as needed constipation.    3.  Goals of care  -To beat the cancer    -Advance Care Planning   Health Care Decision Maker as of 09/04/2020    HCDM, First Alternate: [name] - Domestic Partner - [phone]    HCDM, Second Alternate: Grant, Swager - Brother - [phone]      # Controlled substances risk management.   - Patient has a signed pain medication agreement with Outpatient Palliative Care, completed on 09/02/20, as per standard of care.   - [place]CCS[place] database was reviewed today and it was appropriate.   - Urine drug screen was not performed at this visit. Findings: not applicable.   - Patient has received information about safe storage and administration of medications.   - Patient has received a prescription for narcan.      F/u: phone call next week and then 10/27 video    ----------------------------------------  Referring Provider: Dr. Dorna Mai  Oncology Team: Dr. 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 Had palliative XRT to L forearm in 11/2019. 07/31/2020 hospitalized and diagnosed with new onset type 1 diabetes mellitus. 07/2020 with Occlusive thrombus seen within the mid right brachial vein. Onc notes on 07/29/20: Recommended discontinuing olaparib and pursuing doxorubicin as second line therapy. 07/25/20 scans showing decrease in size of pulmonary metastasis and worsening nodal and left posterior chest wall metastasis. PERITONEUM/RETROPERITONEUM AND MESENTERY/SOFT TISSUES with numerous metastatic deposits are again noted. Paraesophageal measures 5.1 x 2.8 cm, Left posterior pelvic mass measuring 5.2 x 4.2 cm and left iliac mass measures 6.5 x 6.2 cm.    Primary pain is left lower back that has been occurring in the last 1 and half months.  Describes it as a stabbing sensation with needle sticking in his back.  Pain intensity is 8-10/10.  Followed by his right axilla and describes this as a burning sensation.  Has been taking Xtampza ER 9 mg twice a day, oxycodone 5 mg about 4 tablets/day and ibuprofen about 6 tablets this past week.  Reports that the oxycodone did work when he initially started it but does not feel like it is helping that much now.  Feels that the ibuprofen is beneficial.  Recently started Eliquis for thrombosis in right arm. Also appreciating a decreased range of motion in his right arm and it remained swollen.    Energy improved after receiving the blood transfusion.  Is independent with ADLs  except needs help with dressing due to the limited range of motion with his right arm.  Appetite is slowly improving and weight is currently 158 pounds.  At one point he was down to 140 pounds.  Baseline weight is 165 pounds.  No issues with nausea.  Taking olanzapine for days after chemotherapy.  Bowel movements are every 2 to 3 days and using Dulcolax.    Mood has been good.  Notes indicate alcohol and benzodiazepine use. [name] did endorse that when he took the benzodiazepine and things were prescription meds given to him.  He is no longer drinking alcohol after having had the radiation which is altered his taste for alcohol.  Does smoke marijuana.      Current cancer-directed therapy: DOXORUBICIN      Palliative Performance Scale: 90% - Ambulation: Full / Normal Activity, some evidence of disease / Self-Care:Full / Intake: Normal / Level of Conscious: Full    [facility]  [place] Symptom Assessment System-revised    Please insert the number for each symptom bellow:  Symptoms Severity 0=Best & 10=Worst    Pain Number: 8    Tiredness Number: 1   Drowsiness Number: 3   Nausea Number: 0   Lack of Appetite Number: 6   Shortness of Breath Number: 6   Depression Number: 0   Anxiety Number: 0   Wellbeing Number: 0   Other Problem: na Number: N/A     For the following questions please circle the number between 1 and 7 that best applies to you.     How would you rate your overall quality of life during the past week? (1=Very Poor; 7=Excellent)  Number: 6       Coping/Support Issues: Has good support with his family and friends.  He lives with his girlfriend Waynetta Sandy and his brother Lenard Galloway is very supportive.  He does not have children.  He was in accompanied by his friend to him at the initial visit.  Shares that he has good neighbors and his brother-in-law to help him.    Goals of Care: To beat at the cancer.  Short-term goals of completing his tiny house that he has been working on the last couple years.    Social History:   Name of primary support: Beth and Lenard Galloway  Occupation: Did work as a Engineer, structural and quit in 2014.  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  Hobbies: Enjoys working on his tiny house.  Current residence / distance from [facility]: 1 hour and 20 minutes away.    Advance Care Planning:   HCPOA:  Natural surrogate decision maker: Lenard Galloway his brother and Waynetta Sandy his girlfriend.  Living Will:  ACP note:     Objective     Opioid Risk Tool:     Male  Male    Family history of substance abuse Alcohol  1  3    Illegal drugs  2  3    Rx drugs  4  4    Personal history of substance abuse      Alcohol  3  3    Illegal drugs  4  4    Rx drugs  5  5    Age between 22???45 years  1  1    History of preadolescent sexual abuse  3  0    Psychological disease      ADD, OCD, bipolar, schizophrenia  2  2    Depression  1  1       Total: 3  (<3 low risk, 4-7 moderate risk, >8 high risk)    Oncology History   Sarcoma (CMS-HCC)   07/10/2019 Initial Diagnosis    Sarcoma (CMS-HCC)     05/14/2020 - 07/20/2020 Chemotherapy    STUDY [id number] IRB# [id number] OLAPARIB (v. 07/31/19)  A Phase 2 Study of PARP Inhibitor Olaparib ([id number]) in IDH1 and IDH2 Mutant Advanced Solid Tumors     08/12/2020 -  Chemotherapy    OP SARCOMA DOXORUBICIN  DOXOrubicin 75 mg/m2 IV on day 1, every 21 days         Patient Active Problem List   Diagnosis   ??? Anemia   ??? Cervical neck pain with evidence of disc disease   ??? Left shoulder pain   ??? Sarcoma (CMS-HCC)   ??? Insomnia due to mental disorder   ??? PTSD (post-traumatic stress disorder)   ??? GAD (generalized anxiety disorder)   ??? Alcohol use disorder, moderate, dependence (CMS-HCC)   ??? Right arm pain   ??? New onset type 1 diabetes mellitus, uncontrolled (CMS-HCC)   ??? Hyperosmolar hyperglycemic state (HHS) (CMS-HCC)   ??? History of sarcoma   ??? Acute deep vein thrombosis (DVT) of brachial vein of right upper extremity (CMS-HCC)   ??? Other chronic pain   ??? Diabetes mellitus (CMS-HCC)       Past Medical History:   Diagnosis Date   ??? Anemia    ??? Anxiety    ??? Cancer (CMS-HCC)    ??? Chronic back pain    ??? Diabetes mellitus (CMS-HCC) 08/08/2020   ??? Hypertension    ??? Substance abuse (CMS-HCC)        Past Surgical History:   Procedure Laterality Date   ??? IR INSERT PORT AGE GREATER THAN 5 YRS  08/12/2020    IR INSERT PORT AGE GREATER THAN 5 YRS 08/12/2020 Jobe Gibbon, MD IMG VIR H&V [facility]   ??? KNEE ARTHROSCOPY Left 1990       Current Outpatient Medications   Medication Sig Dispense Refill   ??? apixaban (ELIQUIS) 5 mg Tab Take 1 tablet (5 mg total) by mouth Two (2) times a day. 180 tablet 3   ??? blood sugar diagnostic (GLUCOSE BLOOD) Strp Use to check blood sugar as directed with insulin 3 times a day & for symptoms of high or low blood sugar. 100 strip 0   ??? blood-glucose meter kit Use as instructed 1 each 0   ??? blood-glucose meter kit Use as instructed 1 each 0   ??? fluoride, sodium, (CLINPRO 5000) 1.1 % Pste Apply to teeth daily. Brush on teeth twice daily, do not rinse, spit out excess leave on teeth for at least one hr before eating or drinking 100 mL 5   ??? FREESTYLE LITE STRIPS Strp Test daily before all meals/snacks and once before bedtime. 3 each 0   ??? lancets (FREESTYLE) 28 gauge Misc Test daily before all meals/snacks and once before bedtime. 100 each 0   ??? lancets (FREESTYLE) 28 gauge Misc Test daily before all meals/snacks and once before bedtime. 3 each 0   ??? lancets Misc Use to check blood sugar as directed with insulin 3 times a day & for symptoms of high or low blood sugar. 100 each 0   ??? lisinopriL (PRINIVIL,ZESTRIL) 20 MG tablet Take 1 tablet (20 mg total) by mouth daily. 90 tablet 2   ??? metFORMIN (GLUCOPHAGE-XR) 500 MG  24 hr tablet Take 1 tablet (500 mg total) by mouth every morning before breakfast. 90 tablet 3   ??? OLANZapine (ZYPREXA) 5 MG tablet Take 1 tablet (5 mg total) by mouth nightly. Take for the first 4 days after chemotherapy 4 tablet 5   ??? ondansetron (ZOFRAN) 4 MG tablet Take 1 tablet (4 mg total) by mouth daily as needed for nausea. 30 tablet 1   ??? ondansetron (ZOFRAN) 8 MG tablet Take 1 tablet (8 mg total) by mouth every eight (8) hours as needed for nausea (or vomiting). 30 tablet 2   ??? oxyCODONE (ROXICODONE) 5 MG immediate release tablet Take 1 tablet (5 mg total) by mouth every eight (8) hours as needed for pain. Take only if you are having pain that is not controlled by your long acting pain medication. 90 tablet 0   ??? oxyCODONE myristate 9 mg CSpT Take 9 mg by mouth every twelve (12) hours. 60 each 0   ??? pegfilgrastim-cbqv (UDENYCA) 6 mg/0.6 mL injection Inject the contents of 1 syringe (0.46mL) under the skin once every 21 days. Inject 24-72 hours after last chemotherapy dose on day 1. 0.6 mL 4   ??? pen needle, diabetic 31 gauge x 1/4 (6 mm) Ndle Use for insulin administration daily 100 each 0   ??? prochlorperazine (COMPAZINE) 10 MG tablet Take 1 tablet (10 mg total) by mouth every six (6) hours as needed (nausea or vomitting). 30 tablet 2   ??? silver sulfaDIAZINE (SILVADENE, SSD) 1 % cream Apply to affected area daily 50 g 3   ??? EPINEPHrine (EPIPEN) 0.3 mg/0.3 mL injection Inject 0.3 mL (0.3 mg total) into the muscle once for 1 dose. 0.3 mL 0   ??? insulin glargine (LANTUS SOLOSTAR U-100 INSULIN) 100 unit/mL (3 mL) injection pen Inject 0.15 mL (15 Units total) under the skin daily. 4.5 mL 0     No current facility-administered medications for this visit.       Allergies:   Allergies   Allergen Reactions   ??? Venom-Honey Bee Anaphylaxis   ??? Aspirin Other (See Comments)     Stomach pain   ??? Morphine Itching       Family History:  Cancer-related family history includes Cancer in his mother.  He indicated that his mother is deceased. He indicated that his father is deceased. He indicated that his sister is alive. He indicated that his brother is alive.      REVIEW OF SYSTEMS:  A comprehensive review of 14 systems was negative except for pertinent positives noted in HPI.      PHYSICAL EXAM:   Vital signs for this encounter: VS reviewed in EPIC.  GEN: Awake and alert, pleasant appearing male in no acute distress  PSYCH: Alert and oriented to person, place and time. Euthymic.  HEENT: Pupils equally round without scleral icterus. No facial asymmetry.  CV: Regular rhythm with normal rate, no murmurs.  LUNGS: No increased work of breathing.  ABD: Soft and non-tender with no distention  SKIN: No rashes, petechiae or jaundice noted on visible skin  EXT: No edema noted of the lower extremities  NEURO: Normal gait and coordination.    Lab Results   Component Value Date    CREATININE 0.78 09/02/2020     Lab Results   Component Value Date    ALKPHOS 96 09/02/2020    BILITOT 0.2 (L) 09/02/2020    PROT 7.2 09/02/2020    ALBUMIN 3.1 (L) 09/02/2020    ALT 18  09/02/2020    AST 33 09/02/2020          Vital signs for this encounter: VS reviewed in EPIC.  GEN: Awake and alert & in no acute distress  PSYCH: Alert and oriented to person, place and time. Euthymic.  HEENT: Pupils equally round without scleral icterus. No facial asymmetry.  CV: Regular rhythm with normal rate, no murmurs.  LUNGS: No increased work of breathing.  MSK: Slight protrusion and firmness along left lower back.  Firmness along right axilla.  SKIN: Hyperpigmented skin to bilateral forearms  EXT: 2+edema right arm, trace pedal edema  NEURO: Normal gait and coordination.     Pam Drown, FNP-BC, [facility]  Outpatient Oncology Palliative Care Service  [facility]  [address] [place], [place] [address]  [phone]     Time spent with patient was 55 minutes.  Additional 30  minutes were spent on preparation, document eating and coordinating care.

## 2020-09-02 NOTE — Unmapped (Signed)
Port accessed.  Labs drawn & sent for analysis.  To next appt.

## 2020-09-02 NOTE — Unmapped (Signed)
Start oxycodone 10 mg 3 times a day as needed for breakthrough pain.    Take long-acting oxycodone-xtampza 18 mg twice a day around-the-clock as scheduled.  This should not be as needed.    Start gabapentin 100 mg at bedtime for 2 nights and if tolerated increase to 3 capsules at bedtime to help with nerve pain.    Recommend to start MiraLAX twice a day and may increase or decrease based on bowel regimen.  If taking MiraLAX 3 times a day and still experiencing constipation, can start Senokot 2 tablets at nighttime.    Here is some additional information about [facility] Outpatient Oncology Palliative Care:     What is Palliative Care?  Palliative Care is medical care that focuses on managing symptoms of cancer or side effects from cancer treatment. The Outpatient Oncology Palliative Care service uses a team approach to help patients and their caregivers with the goal of maintaining the best quality of life possible during a cancer diagnosis and cancer treatment.   The Outpatient Oncology Palliative Care team serves all outpatient oncology clinics including surgery, gynecology, medicine and radiation oncology.      Who provides Outpatient Palliative Care?  The Outpatient Oncology Palliative Care team includes doctors, nurses, pharmacists, social workers, counselors and nutritionists. The team works with a patient's primary oncology team to create care plans that can help manage symptoms related to cancer and cancer treatment.     What services are offered by Outpatient Oncology Palliative Care?  We can help cancer patients with:  ?? Pain  ?? Symptoms such as nausea, vomiting, anxiety, depression, fatigue, loss of appetite of shortness of breath  ?? Emotional, psychological, or spiritual suffering  ?? Nutritional problems  ?? Concern about medical decisions  ?? Difficulty communicating values, goals and personal choices.     Who are the Outpatient Oncology Palliative Care team members?  Physicians: Doreatha Martin, MD and  Kathlynn Grate, MD  Nurse Practitioner: Burtis Junes, FNP  Nurse Clinical Coordinator: Allegra Lai, RN, BSN, MS, OCN  Administrative Specialist: Dolores Frame     When can I see the Outpatient Oncology Palliative Care team and how do I make contact in between visits?  We see patients Monday through Friday 8am - 4:30pm. If you have questions or concerns during clinic hours, please contact us. Any requests made outside of business hours will be addressed the following business day.      For appointments & questions Monday through Friday 8 AM??? 4:30 PM:  please call [phone] or Toll free [phone].     On Nights, Weekends and Holidays:  Call [phone] and ask for the Oncologist on call.

## 2020-09-02 NOTE — Unmapped (Signed)
[facility] SARCOMA ONCOLOGY FOLLOW UP VISIT    Encounter Date: 09/02/2020  Patient Name: Ivan Banks  Medical Record Number: [id number]    Referring Physician: Dr Edilia Bo, [facility] Ortho onc     Consulting Physician: Dr Glennon Mac, [facility] [place], [place]    Primary Care Provider: Suanne Marker, MD     DIAGNOSIS: STS RIGHT forearm, with presumed lung and axillary metastases    ASSESSMENT: 59 y.o. 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  I reviewed the [facility] Sarcoma Targeted Gene Fusion Panel results for this patient, which did not note fusions in NR4A3 (extraskeletal myxoid chondrosarcoma) or HEY1-NCOA2 (mesenchymal chondrosarcoma).  Patient enrolled on  [id number] olaparib trial for his IDH-mutated disease but taken off-study after 2 cycles of olaparib due to mixed response / progression on imaging.  - tol C1 doxorubicin overall better  - RTC 3 weeks with labs, clinic visit for consideration of doxorubicin C3    Pain:   Increasing due to right axillary pain, we discussed this is an area which was previously radiated and likely cannot tolerate further XRT.  Discussed referral to palliative care for pain management given increasing complexity of his pain requirements.  - Right axillary pain from tumor nodule, on oxycodone 5mg  which he takes 2-3 times daily.  - #90 of oxycodone 5mg  filled 07/16/2020, PDMP verified    Right brachial vein DVT:    PVL ultrasound 08/06/2020 revealed occlusive thrombus in right mid brachial vein.  Patient reports having an IV at the site of the swelling that infiltrated during hospitalization for elevated glucose levels, and that pain and swelling (both at immediate IV site as well as distal arm) started while IV was in place.  Risk factors are clearly active malignancy.  We discussed this may be catheter-related and, while evidence supports continuing anticoagulation long-term for lower extremity DVTs given pro-thrombotic malignancy, we could reconsider this in the future depending on the clinical circumstances (e.g. how well he tolerates therapy, cost, tumor burden).  I recommend continuing this for the next 3 months and re-evaluating.  He is currently on apixaban and within the 7 day lead-in period after starting drug.  - Continue apixabin    I personally reviewed the medical records, pathology and laboratory results and viewed the imaging. All questions were answered to the patient's satisfaction and they voiced understanding and agreement with the plan. Pt has my card and contact information and is encouraged to reach out with any further questions.      REASON FOR CONSULTATION:   Ivan Banks is a 59 y.o. male who is seen in consultation at the request of [name], MD for evaluation of his soft tissue sarcoma.      HISTORY OF PRESENT ILLNESS:      Since his last visit he was briefly hospitalized with blood glucose levels in the 600-800 range, quickly recovered with IV insulin and fluids.  He developed swelling in his right arm after an IV infiltrated, and this swelling persisted for several days.  PVL doppler studies demonstrated mid-brachial vein occlusive thrombus on the right.  Today reports his arm is largely stable, not much improved since starting apixban a few days prior.  Using heat on arm which helps throbbing pain.  Denies cough, dyspnea, or new  aches or pains.  He is here today with a friend / neighbor.      Oncologic timeline:  - 2015-2016: Patient first notes small nodule in middle of RIGHT anterior forearm, nonpainful  - 06/12/19: Meets with local general surgeon, CT of RUE ordered  - 06/15/19: CT Chest w contrast: Right axillary lymphadenopathy largest 3.7cm, multiple solid/subsolid  Pulmonary nodules bilaterally with largest 8.68mm over right lower lobe.  - 06/25/19: US guided biopsy of R forearm and axillary masses  - Sept 2020: palliative RT to the right forearm (55Gy/59fx) and R axilla (48Gy/40fx) finished 08/22/2019  - 08/23/2019: STRATA molecular testing: IDH1 pR132C, MS:S, TMB 99mut/Mb (low), also detected variants of uncertain significance: MLL3 M1242K (53% VAF), TAF1L K914E (5% VAF), TPR Q1063* (9% VAF)  - 10/30/19: MRI LEFT forearm: 6.5 x 3.1 x 3.8 cm mass in left flexor compartment, likely focus of metastatic disease  - 12/19/19: Completes palliative XRT to LEFT forearm lesion, 5500cGy with Dr Rayetta Humphrey  - 01/01/20: CT Chest wo contrast: Decrease in right axillary lymphadenopathy, two new right lower lobe and left upper lobe 0.7 cm pulmonary nodules, increased size of left paratracheal node, and increased size of right subpectoral node compatible with metastatic disease  - 04/01/20: CT chest wo contrast: Interval increased size of pulmonary metastasis and multi compartmental mediastinal and right axillary nodal metastasis consistent with disease progression.  Interval prominence of upper abdominal lymphadenopathy, concerning for metastatic disease. Recommend CT abdomen and pelvis for better characterization.  - 05/06/20: CT Chest wo contrast: Increased burden of metastatic disease including increased size of multiple pulmonary nodules, axillary, retrocrural, mediastinal and gastrohepatic lymphadenopathy. Additionally there are peritoneal masses concerning for metastatic disease.  - 05/14/20: C1D1 olaparib per [id number] (300mg  po BID)  -06/10/20: Interruption in start of C2 due to anemia (14.2 -> 8.3) with C1 olaparib, recheck H&H in 2 weeks  - 06/24/20: Start of C2 olaparib, DR to 250mg  BID  - 07/25/20: CT Chest/Abdomen/Pelvis w contrast: Mixed response, mostly progression, with decrease size of pulmonary nodules and increase in mediastinal, abdominal and pelvic nodal and soft tissue implants.  - 08/12/20: C1 doxorubicin 75mg /m2   - 09/02/20: Overall tol C1 well no issues. Blood sugars stable, no N/V/D/C. Proceed with C2 doxorubicin     REVIEW OF SYSTEMS:  A comprehensive review of 12 systems was negative except for pertinent positives noted in HPI.    Past Medical History:   Diagnosis Date   ??? Anemia    ??? Anxiety    ??? Cancer (CMS-HCC)    ??? Chronic back pain    ??? Diabetes mellitus (CMS-HCC) 08/08/2020   ??? Hypertension    ??? Substance abuse (CMS-HCC)    - Pancreatitis 2009  - Last colonoscopy 2009       Past Surgical History:   Procedure Laterality Date   ??? IR INSERT PORT AGE GREATER THAN 5 YRS  08/12/2020    IR INSERT PORT AGE GREATER THAN 5 YRS 08/12/2020 Jobe Gibbon, MD IMG VIR [facility]   ??? KNEE ARTHROSCOPY Left 1990        Family History   Problem Relation Age of Onset   ??? Cancer Mother    ??? Diabetes Father    ??? Kidney disease Father    ??? Diabetes Sister    ??? Lupus Sister    ??? No Known Problems Brother         Social History     Occupational History   ??? Occupation: Boilermaker   ???  Occupation: Nurse, adult   ??? Occupation: Disability   Tobacco Use   ??? Smoking status: Never Smoker   ??? Smokeless tobacco: Never Used   Vaping Use   ??? Vaping Use: Never used   Substance and Sexual Activity   ??? Alcohol use: Not Currently   ??? Drug use: Yes     Frequency: 21.0 times per week     Types: Marijuana, Benzodiazepines     Comment: Smokes 2-3x a day   ??? Sexual activity: Yes   - Works on a horse farm  - Routine cannabis use      ALLERGIES/MEDICATIONS:  Reviewed in EPIC    PHYSICAL EXAM:   Vitals: reviewed in chart  Gen: Comfortable, NAD. ECOG PS 1  Eyes: EOMI, sclera anicteric  ENT:  MMM.   Neck: Supple, trachea midline  Lymph: No cervical, submandibular, or supraclavicular lymphadenopathy.  There is a palpable firm fixed nodule in the deep right axilla, mildly tender today  CV: RRR no murmurs   Lungs: CTAB w/o rales, rhochi, or wheezing, good respiratory effort   GI: +BS, Soft, NT, ND, no masses appreciated   Ext: Warm and well perfused. No cyanosis, clubbing, or edema bilaterally   Skin: No rashes. 2x5cm darkened macula on right forearm from radiation treatment, left forearm just distal to elbow an area of darkened skin.  No discharge, erythema or pain around the site.  There again a palpable 2cm nodule attached to the lateral portion of the right pectoralis muscle, this is mildly tender to palpation and without overlying erythema or skin changes, feels more firm that two weeks prior.  Neuro: Moving all extremities, speech fluid.  Psych: mood euthymic and affect appropriate   MSK: No focal bony deformaties or tenderness    +++++++++++++++++++++++++++++++++++++++++++++++++++++++++++    PATHOLOGY:   Pathology was personally reviewed as described in the HPI, detailed in EPIC.  06/25/2019 Biopsy:  Final Diagnosis   A: Axilla, right, biopsy  - Metastatic, epithelioid/undifferentiated malignancy.  - See diagnostic comment.  ??  B: Forearm, right, biopsy  - Malignant epithelioid malignancy.  - See diagnostic comment.  ??  This electronic signature is attestation that the pathologist personally reviewed the submitted material(s) and the final diagnosis reflects that evaluation.   Electronically signed by Meyer Cory, MD on 07/04/2019 at 1511   Comment    Immunohistochemical staining was performed on this material with the following results: (+) Vimentin, ema, CD99 (-) CK7, CK20, OSCAR, S100, HMB-45, Desmin, smooth muscle actin, SOX-10, CEA, Calponin, cytokeratin, CD68, WT1.  In addition, there was equivocal or focal staining for p63.  Staining for INI1 showed nuclear localization (normal result).  ??  This material was shown in consultation to Dr. Eyvonne Left and Dr. Linton Rump of Dermatopathology.  ??  Overall, this represents a epithelioid malignancy, most likely a sarcoma, of unknown differentiation.  Material will be sent for detection of gene fusions in an effort to further classify.  A supplemental report will follow.     Addendum   Results from the Sarcoma Targeted Gene Fusion Panel (SARCP) performed at [facility]:  ??  No reportable fusions identified in 138 targeted genes.         LABS:  Reviewed in EPIC      RADIOLOGY:  Imaging was personally viewed as summarized in the history (see above).  I agree with the findings/interpretation; details in EPIC.

## 2020-09-03 ENCOUNTER — Ambulatory Visit: Admit: 2020-09-03 | Payer: PRIVATE HEALTH INSURANCE

## 2020-09-03 NOTE — Unmapped (Signed)
Lab on 09/02/2020   Component Date Value Ref Range Status   ??? Sodium 09/02/2020 133* 135 - 145 mmol/L Final   ??? Potassium 09/02/2020 3.7  3.4 - 4.5 mmol/L Final   ??? Chloride 09/02/2020 105  98 - 107 mmol/L Final   ??? Anion Gap 09/02/2020 4* 5 - 14 mmol/L Final   ??? CO2 09/02/2020 24.0  20.0 - 31.0 mmol/L Final   ??? BUN 09/02/2020 16  9 - 23 mg/dL Final   ??? Creatinine 09/02/2020 0.78  0.70 - 1.30 mg/dL Final   ??? BUN/Creatinine Ratio 09/02/2020 21   Final   ??? EGFR CKD-EPI Non-African American,* 09/02/2020 >90  >=60 mL/min/1.66m2 Final   ??? EGFR CKD-EPI African American, Male 09/02/2020 >90  >=60 mL/min/1.48m2 Final   ??? Glucose 09/02/2020 178  70 - 179 mg/dL Final   ??? Calcium 16/08/9603 9.3  8.7 - 10.4 mg/dL Final   ??? Albumin 54/07/8118 3.1* 3.4 - 5.0 g/dL Final   ??? Total Protein 09/02/2020 7.2  5.7 - 8.2 g/dL Final   ??? Total Bilirubin 09/02/2020 0.2* 0.3 - 1.2 mg/dL Final   ??? AST 14/78/2956 33  <=34 U/L Final   ??? ALT 09/02/2020 18  10 - 49 U/L Final   ??? Alkaline Phosphatase 09/02/2020 96  46 - 116 U/L Final   ??? WBC 09/02/2020 8.5  4.5 - 11.0 10*9/L Final   ??? RBC 09/02/2020 2.32* 4.50 - 5.90 10*12/L Final   ??? HGB 09/02/2020 8.1* 13.5 - 17.5 g/dL Final   ??? HCT 21/30/8657 24.9* 41.0 - 53.0 % Final   ??? MCV 09/02/2020 107.3* 80.0 - 100.0 fL Final   ??? MCH 09/02/2020 34.7* 26.0 - 34.0 pg Final   ??? MCHC 09/02/2020 32.4  31.0 - 37.0 g/dL Final   ??? RDW 84/69/6295 16.5* 12.0 - 15.0 % Final   ??? MPV 09/02/2020 9.6  7.0 - 10.0 fL Final   ??? Platelet 09/02/2020 368  150 - 440 10*9/L Final   ??? Neutrophils % 09/02/2020 80.3  % Final   ??? Lymphocytes % 09/02/2020 12.3  % Final   ??? Monocytes % 09/02/2020 4.8  % Final   ??? Eosinophils % 09/02/2020 0.4  % Final   ??? Basophils % 09/02/2020 0.4  % Final   ??? Absolute Neutrophils 09/02/2020 6.8  2.0 - 7.5 10*9/L Final   ??? Absolute Lymphocytes 09/02/2020 1.0* 1.5 - 5.0 10*9/L Final   ??? Absolute Monocytes 09/02/2020 0.4  0.2 - 0.8 10*9/L Final   ??? Absolute Eosinophils 09/02/2020 0.0  0.0 - 0.4 10*9/L Final   ??? Absolute Basophils 09/02/2020 0.0  0.0 - 0.1 10*9/L Final   ??? Large Unstained Cells 09/02/2020 2  0 - 4 % Final   ??? Macrocytosis 09/02/2020 Marked* Not Present Final   ??? Anisocytosis 09/02/2020 Slight* Not Present Final   ??? Hypochromasia 09/02/2020 Slight* Not Present Final   ??? Smear Review Comments 09/02/2020 See Comment* Undefined Final    Slide reviewed     ??? Toxic Vacuolation 09/02/2020 Present* Not Present Final   ??? Vitamin B-12 09/02/2020 1,429* 211 - 911 pg/ml Final   ??? TSH 09/02/2020 1.299  0.550 - 4.780 uIU/mL Final   ??? Folate 09/02/2020 16.5  >=5.4 ng/mL Final

## 2020-09-03 NOTE — Unmapped (Signed)
Patient in clinic for treatment.  No new concerns or complaints.  Vesicant precautions followed during Doxorubicin push with blood return checked every 3ml.  Patient tolerated treatment without incident.

## 2020-09-04 MED ORDER — NALOXONE 4 MG/ACTUATION NASAL SPRAY
0 refills | 0 days | Status: CP
Start: 2020-09-04 — End: ?

## 2020-09-05 NOTE — Unmapped (Signed)
; ??????????????????  Support for cancer patients and their caregivers during the COVID-19 pandemic [email or web address]  ; Keeping cancer patients safe during the COVID-19 crisis [email or web address]-[email or web address]  ; +++++++++++++++++++++++++++++++++++++++++++++++++++++++++++  ; COVID-19 Vaccines:  ; Schedule an appointment online or call [phone] (M-F 8am-5pm).  ; For more information on how to get a vaccine, visit Get Vaccinated.  ; [email or web address].[email or web address]   ; +++++++++++++++++++++++++++++++++++++++++++++++++++++++++++  ;   ;  WHAT YOU SHOULD KNOW ABOUT THE COVID-19 VACCINES   ; People with cancer are at an increased risk for sickness and death from COVID-19. We recommend patients with cancer, including those who are receiving cancer treatment, be vaccinated as soon as they are eligible.   ; Know your platelet count. Some patients with specific cancers or those receiving certain treatments may have a low platelet count (part of the blood that helps with clotting). If this is you, talk with your care team prior to getting vaccinated.   ; Bone marrow transplant patients should wait until at least 3 months after transplant. Patients who have undergone a stem cell transplant (SCT) or Car-T/Cellular Therapy (CT) will need to wait at least 3 months after their transplant to receive a vaccine.   ; The vaccines work.   ; Scientists found the vaccines could help prevent people from getting sick with COVID-19.   ; The vaccines are safe. The COVID-19 vaccines went through every stage of clinical trials. More than 70,000 people of all different races and ethnicities participated in the COVID-19 vaccine trials. Health professionals will continue to monitor the vaccines' safety and effectiveness against the virus.   ; Very few people experience serious side effects. Just like other vaccines, the COVID-19 vaccines can cause minor side effects. If you are worried about potential side effects, contact your doctor.   ; For cancer patients, we recommend one of the two dose vaccine. This will help your body build up a strong resistance to COVID-19. You will get your second dose three to four weeks after your first one.   ; Some people should talk to their doctor first. People who have serious allergies and/or are pregnant, breastfeeding or immunocompromised should talk with their doctor before receiving a COVID-19 vaccine.   ; If you have had COVID-19, you should still get the vaccines. It is possible to get COVID-19 more than once and the vaccines will help to prevent you from getting sick again.   ; Please continue to wear a mask and practice physical distancing. Even after you get a COVID-19 vaccine, you should continue to wear a mask, wash your hands regularly and stay 6 feet apart. Thank you for helping protect yourself and your community!   ; +++++++++++++++++++++++++++++++++++++++++++++++++++++++++++  ; Your nurse navigator is:  ; Roseanne Reno, RN, BSN, OCN   ; Head & Neck/Sarcoma  Oncology Nurse Navigator  ; [email or web address]  ;   ; For appointments & questions Monday through Friday 8 AM??? 5 PM   ; please call [phone] or Toll free [phone].  ;   ; On Nights, Weekends and Holidays  ; Call [phone] and ask for the adult hematologist-oncologist on call.  ;   ; [facility]  ; [address]  ; [place], [place] [address]  ; [email or web address]  ; Tel: [phone] / Toll Free [phone]  ; Fax: [phone]  ; +++++++++++++++++++++++++++++++++++++++++++++++++++++++++++  ;   ;   ;

## 2020-09-07 NOTE — Unmapped (Signed)
Hem/Onc Phone Triage Note    Caller: Ivan Banks, [phone]    Reason for Call:   Ivan Banks is a 59 y.o.  man with undifferentiated sarcoma of bilateral forearms with suspected axillary and lung metastases who is calling for worsening R arm swelling.  ??  He reports increasing swelling of his R arm over the past month since being diagnosed with a R brachial DVT for which he is anticoagulated with apixaban. He has been experiencing difficulty closing his fist but remains with intact sensation. He reports adherence to apixaban. In the past his symptoms improved with raising his arm but now he has a persistent swelling. He was recently evaluated in clinic and he reports a similar level of swelling.     Assessment/Plan:   - Advised to present to the ED for further evaluation of clot extension or sarcoma if worsening swelling, pain, numbness   - I will notify primary Oncology team about additional outpatient work-up  ??  Please page Oncology Admissions at [phone] if patient needs admission or questions about care occur.     Fellow Taking Call:  Jos?? Sherrie George, MD, PhD  [facility] Hematology and Oncology Fellow    September 06, 2020 7:36 PM

## 2020-09-09 NOTE — Unmapped (Signed)
Returned call to pt. He states he continues to note swelling and pain in his right arm. States the swelling is worse than when he saw Dr. Loree Fee last Tuesday.He states he has dealing with this for over a month.     He called and spoke with fellow on call on Saturday with the same complaint and was advised to present to ED for eval. Pt did not go.States he does not drive and did not have a way to the ED.He states he is taking eliquis as directed and has not missed any doses. He continues to experience difficulty closing his fist and bending his arm to eat but sensation remains intact.He denies any SOB or chest pain. He states he is going to have his sister bring him to [facility] ED in the morning. Recommended she bring him this evening when she gets off work but he would not commit to it.Told him we can not eval over the phone and needs someone to see it in person.He has pain meds that he states he is taking as directed also.    Let him know if he experiences any SOB, chest pain, numbness in arms, or any worsening symptom to call EMS.Let him know I will notify team of his plans.

## 2020-09-09 NOTE — Unmapped (Signed)
Ivan Banks reporting he is having more swelling in the right forearm and right hand.  When he lays down at night his pain is actually better but when he is upright that is when he has more pain and swelling.  Pain is predominantly in the right antecubital region.  Still has the use of his right arm function.  Reports the pain can get up to 12/10 but he is getting overall adequate pain relief by taking xtampza 18 mg twice daily and oxycodone 10 mg 3 times a day.  Has not started the gabapentin yet.  Describes his pain as a sharp needle sensation in his back.        Plan  -Will be coming into the ED tomorrow morning for the increasing right arm edema.  The soonest he can get here is tomorrow morning due to transportation.  -Increase oxycodone 10 mg every 8 hours prn to oxycodone 10 mg every 6 hours as needed pain.  -Continue Xtampza ER 18 mg twice daily.  -Has not started gabapentin and explained benefits of taking gabapentin.    Burtis Junes, FNP-BC, [facility]  Outpatient Oncology Palliative Care Service  [facility]  [address], [place], [place] [address]  [phone]

## 2020-09-09 NOTE — Unmapped (Signed)
Hi,    Patient contacted the Oncology Communication [facility] today Stating that he has blood clots in his right arm and he has severe pain in his right arm from his elbow to his fingers are swollen. Patient is planning to come to the ER tomorrow. Contact patient back at  [phone].    .Thank you,   Noland Fordyce  [facility] Communication [facility]  [phone]

## 2020-09-10 ENCOUNTER — Ambulatory Visit
Admit: 2020-09-10 | Discharge: 2020-09-11 | Disposition: A | Discharge status: Home / Self Care | Payer: PRIVATE HEALTH INSURANCE | Admitting: Internal Medicine

## 2020-09-10 LAB — CBC W/ AUTO DIFF
BASOPHILS ABSOLUTE COUNT: 0 10*9/L (ref 0.0–0.1)
BASOPHILS RELATIVE PERCENT: 1 %
EOSINOPHILS ABSOLUTE COUNT: 0.1 10*9/L (ref 0.0–0.4)
EOSINOPHILS RELATIVE PERCENT: 1.5 %
HEMATOCRIT: 23.5 % — ABNORMAL LOW (ref 41.0–53.0)
HEMOGLOBIN: 7.7 g/dL — ABNORMAL LOW (ref 13.5–17.5)
LARGE UNSTAINED CELLS: 2 % (ref 0–4)
LYMPHOCYTES ABSOLUTE COUNT: 0.5 10*9/L — ABNORMAL LOW (ref 1.5–5.0)
LYMPHOCYTES RELATIVE PERCENT: 13.9 %
MEAN CORPUSCULAR HEMOGLOBIN CONC: 32.6 g/dL (ref 31.0–37.0)
MEAN CORPUSCULAR HEMOGLOBIN: 34.6 pg — ABNORMAL HIGH (ref 26.0–34.0)
MEAN CORPUSCULAR VOLUME: 106.4 fL — ABNORMAL HIGH (ref 80.0–100.0)
MEAN PLATELET VOLUME: 9.5 fL (ref 7.0–10.0)
NEUTROPHILS ABSOLUTE COUNT: 2.6 10*9/L (ref 2.0–7.5)
NEUTROPHILS RELATIVE PERCENT: 78 %
PLATELET COUNT: 197 10*9/L (ref 150–440)
RED BLOOD CELL COUNT: 2.21 10*12/L — ABNORMAL LOW (ref 4.50–5.90)
WBC ADJUSTED: 3.3 10*9/L — ABNORMAL LOW (ref 4.5–11.0)

## 2020-09-10 LAB — SLIDE REVIEW

## 2020-09-10 LAB — BASIC METABOLIC PANEL
BLOOD UREA NITROGEN: 14 mg/dL (ref 9–23)
CALCIUM: 9.5 mg/dL (ref 8.7–10.4)
CHLORIDE: 105 mmol/L (ref 98–107)
CO2: 22 mmol/L (ref 20.0–31.0)
CREATININE: 0.7 mg/dL
EGFR CKD-EPI AA MALE: >90 mL/min/{1.73_m2} (ref >=60)
EGFR CKD-EPI NON-AA MALE: >90 mL/min/{1.73_m2} (ref >=60)
GLUCOSE RANDOM: 94 mg/dL (ref 70–179)
POTASSIUM: 3.9 mmol/L (ref 3.4–4.5)
SODIUM: 134 mmol/L — ABNORMAL LOW (ref 135–145)

## 2020-09-10 LAB — PROTIME-INR: INR: 1.21

## 2020-09-10 LAB — TOXIC GRANULATION

## 2020-09-10 LAB — APTT
APTT: 35.7 s (ref 24.9–36.9)
Coagulation surface induced:Time:Pt:PPP:Qn:Coag: 35.7

## 2020-09-10 LAB — POTASSIUM: Potassium:SCnc:Pt:Ser/Plas:Qn:: 3.9

## 2020-09-10 LAB — MACROCYTES

## 2020-09-10 LAB — INR: Coagulation tissue factor induced.INR:RelTime:Pt:PPP:Qn:Coag: 1.21

## 2020-09-10 MED ADMIN — oxyCODONE (ROXICODONE) immediate release tablet 10 mg: 10 mg | ORAL | Stop: 2020-09-10

## 2020-09-10 NOTE — Unmapped (Addendum)
[facility] Emergency Department Provider Note      ED Course, Assessment and Plan     Initial Clinical Impression:    September 10, 2020 1:29 PM   Ivan Banks is a 59 y.o. 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  Patient states that since starting apixaban, his arm swelling has not improved and has somewhat worsened, especially over the last week or so.  He reports associated pain with movement of the arm.  On exam, he is chronically ill-appearing.  He has right arm swelling from the level of the mid humerus distally.  He has no focal areas of tenderness, rather his entire forearm and hand is slightly tender.  No increased warmth or redness in comparison to the left arm.  No concern for infection.  Distal sensation is intact.  Range of motion is intact.  No focal bony tenderness.  Radial and ulnar pulses are 2+.  Awaiting results of PVL ordered in triage.  Pending results, will consider contacting hematology for anticoagulant recommendations.    BP 131/71  - Pulse 77  - Temp 36.7 ??C (98 ??F) (Oral)  - Resp 16  - SpO2 94%     DDx: Chronic DVT, acute DVT, worsening tumor burden, failure of outpatient anticoagulation with apixaban, thrombosis stemming from indwelling IJ port, Lymphedema (doubt)    Diagnostic orders as below.  Will treat with oxycodone as needed for pain.    Orders Placed This Encounter   Procedures   ??? CT Chest W Contrast   ??? CBC w/ Differential   ??? PT-INR   ??? APTT   ??? Basic Metabolic Panel ??? POCT Glucose - RN Obtain   ??? ECG 12 Lead       ED Course:  ED Course as of Sep 10 2310   Wed Sep 10, 2020   1340 Hemoglobin 7.7, stable from previous.      1801 Impression:  Evidence of acute obstruction is visualized in the basilic vein. Evidence of continued obstruction is visualized in the brachial vein.    Given these findings, will reach out to heme for recommendations.   PVL Venous Duplex Upper Extremity Right   1900 Discussed patient with heme who will come to see the patient as a consult.  They recommended reaching out to VIR to discuss proper study to better characterize the patient's clot and to determine if it extends to or from his right IJ port.  VIR recommended further evaluation with a CT chest with contrast, will order.  Pending CT findings, will contact hematology for further anticoagulation recommendations or VIR if thrombus is found to be stemming from IJ port.  Regardless, expect admission for anticoagulation versus procedural intervention.      2100 Patient signed out to Dr. Fonnie Mu at shift change.        _____________________________________________________________________    The case was discussed with attending physician who is in agreement with the above assessment and plan    Additional Medical Decision Making     I have reviewed the vital signs and  the nursing notes. Labs and radiology results that were available during my care of the patient were independently reviewed by me and considered in my medical decision making.   I independently visualized the EKG tracing if performed  I independently visualized the radiology images if performed  I reviewed the patient's prior medical records if available.  Additional history obtained from family if available    History     CHIEF COMPLAINT:   Chief Complaint   Patient presents with   ??? Arm Pain       HPI: Ivan Banks is a 59 y.o. 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  Patient states that since starting apixaban, he has had continued pain and swelling of the right upper extremity that has somewhat worsened over the last week.  He states that he has been religious in taking his apixaban as prescribed.  He reports concerned that his DVT is not improving.  He has not had any trauma to the arm recently.  Reports he has most pain at his elbow and with flexion of his hand.  He denies any weakness or numbness in the extremity.  He has not had any fevers, chills, abdominal pain, nausea, vomiting, diarrhea, constipation, urinary issues, rash, headache, chest pain, palpitations, shortness of breath,, orthopnea, PND, who think any cough.    PAST MEDICAL HISTORY/PAST SURGICAL HISTORY:   Past Medical History:   Diagnosis Date   ??? Anemia    ??? Anxiety    ??? Cancer (CMS-HCC)    ??? Chronic back pain    ??? Diabetes mellitus (CMS-HCC) 08/08/2020   ??? Hypertension    ??? Substance abuse (CMS-HCC)        Past Surgical History:   Procedure Laterality Date   ??? IR INSERT PORT AGE GREATER THAN 5 YRS  08/12/2020    IR INSERT PORT AGE GREATER THAN 5 YRS 08/12/2020 Jobe Gibbon, MD IMG VIR [facility]   ??? KNEE ARTHROSCOPY Left 1990     MEDICATIONS:   No current facility-administered medications for this encounter.    Current Outpatient Medications:   ???  apixaban (ELIQUIS) 5 mg Tab, Take 1 tablet (5 mg total) by mouth Two (2) times a day., Disp: 180 tablet, Rfl: 3  ???  blood sugar diagnostic (GLUCOSE BLOOD) Strp, Use to check blood sugar as directed with insulin 3 times a day & for symptoms of high or low blood sugar., Disp: 100 strip, Rfl: 0  ???  blood-glucose meter kit, Use as instructed, Disp: 1 each, Rfl: 0  ???  blood-glucose meter kit, Use as instructed, Disp: 1 each, Rfl: 0  ???  EPINEPHrine (EPIPEN) 0.3 mg/0.3 mL injection, Inject 0.3 mL (0.3 mg total) into the muscle once for 1 dose., Disp: 0.3 mL, Rfl: 0  ???  fluoride, sodium, (CLINPRO 5000) 1.1 % Pste, Apply to teeth daily. Brush on teeth twice daily, do not rinse, spit out excess leave on teeth for at least one hr before eating or drinking, Disp: 100 mL, Rfl: 5  ???  FREESTYLE LITE STRIPS Strp, Test daily before all meals/snacks and once before bedtime., Disp: 3 each, Rfl: 0  ???  gabapentin (NEURONTIN) 100 MG capsule, Take 1 capsule (100 mg total) by mouth nightly. X 2 nights and if tolerating, then increase to 3 caps (300 mg) every night, Disp: 270 capsule, Rfl: 0  ???  insulin glargine (LANTUS SOLOSTAR U-100 INSULIN) 100 unit/mL (3 mL) injection pen, Inject 0.15 mL (15 Units total) under the skin daily., Disp: 4.5 mL, Rfl: 0  ???  lancets (FREESTYLE) 28 gauge Misc, Test daily before all meals/snacks and once before bedtime., Disp: 100 each, Rfl: 0  ???  lancets (FREESTYLE) 28 gauge Misc, Test daily before all meals/snacks and once before bedtime., Disp: 3 each, Rfl: 0  ???  lancets Misc, Use to check blood sugar as directed with insulin 3 times a day & for symptoms of high or low blood sugar., Disp: 100 each, Rfl: 0  ???  lisinopriL (PRINIVIL,ZESTRIL) 20 MG tablet, Take 1 tablet (20 mg total) by mouth daily., Disp: 90 tablet, Rfl: 2  ???  metFORMIN (GLUCOPHAGE-XR) 500 MG 24 hr tablet, Take 1 tablet (500 mg total) by mouth every morning before breakfast., Disp: 90 tablet, Rfl: 3  ???  naloxone (NARCAN) 4 mg nasal spray, One spray in either nostril once for known/suspected opioid overdose. May repeat every 2-3 minutes in alternating nostril til EMS arrives, Disp: 2 each, Rfl: 0  ???  OLANZapine (ZYPREXA) 5 MG tablet, Take 1 tablet (5 mg total) by mouth nightly. Take for the first 4 days after chemotherapy, Disp: 4 tablet, Rfl: 5  ???  ondansetron (ZOFRAN) 4 MG tablet, Take 1 tablet (4 mg total) by mouth daily as needed for nausea., Disp: 30 tablet, Rfl: 1  ???  ondansetron (ZOFRAN) 8 MG tablet, Take 1 tablet (8 mg total) by mouth every eight (8) hours as needed for nausea (or vomiting)., Disp: 30 tablet, Rfl: 2  ???  oxyCODONE (ROXICODONE) 10 mg immediate release tablet, Take 1 tablet (10 mg total) by mouth every eight (8) hours as needed for pain., Disp: 90 tablet, Rfl: 0  ???  oxyCODONE myristate (XTAMPZA ER) 18 mg CSpT, Take 18 mg by mouth every twelve (12) hours., Disp: 60 each, Rfl: 0  ???  pegfilgrastim-cbqv (UDENYCA) 6 mg/0.6 mL injection, Inject the contents of 1 syringe (0.34mL) under the skin once every 21 days. Inject 24-72 hours after last chemotherapy dose on day 1., Disp: 0.6 mL, Rfl: 4  ???  pen needle, diabetic 31 gauge x 1/4 (6 mm) Ndle, Use for insulin administration daily, Disp: 100 each, Rfl: 0  ???  polyethylene glycol (MIRALAX) 17 gram/dose powder, Take 17 g by mouth two (2) times a day as needed (constipation)., Disp: 850 g, Rfl: 0  ???  prochlorperazine (COMPAZINE) 10 MG tablet, Take 1 tablet (10 mg total) by mouth every six (6) hours as needed (nausea or vomitting)., Disp: 30 tablet, Rfl: 2  ???  senna (SENOKOT) 8.6 mg tablet, Take 1 tablet by mouth two (2) times a day as needed for constipation., Disp: 60 tablet, Rfl: 0  ???  silver sulfaDIAZINE (SILVADENE, SSD) 1 % cream, Apply to affected area daily, Disp: 50 g, Rfl: 3    ALLERGIES:   Venom-honey bee, Aspirin, and Morphine    SOCIAL HISTORY:   Social History     Tobacco Use   ??? Smoking status: Never Smoker   ??? Smokeless tobacco: Never Used   Substance Use Topics   ???  Alcohol use: Not Currently       FAMILY HISTORY:  Family History   Problem Relation Age of Onset   ??? Cancer Mother    ??? Diabetes Father    ??? Kidney disease Father    ??? Diabetes Sister    ??? Lupus Sister    ??? No Known Problems Brother Review of Systems  A 10 point review of systems was performed and is negative other than positive elements noted in HPI     Physical Exam     VITAL SIGNS:    BP 131/71  - Pulse 77  - Temp 36.7 ??C (98 ??F) (Oral)  - Resp 16  - SpO2 94%     Constitutional: Alert and oriented.  Chronically ill-appearing but in no acute distress.  Eyes: Conjunctivae are normal.  ENT       Head: Normocephalic and atraumatic.       Nose: No congestion.       Mouth/Throat: Mucous membranes are moist.       Neck: No stridor.  Cardiovascular: S1, S2,  Normal and symmetric distal pulses are present in all extremities. Warm and well perfused.  Heart rate in the 70s.  Respiratory: Normal respiratory effort. Breath sounds are normal.  Gastrointestinal: Soft and nontender, nondistended.  Musculoskeletal:  Patient has right upper extremity edema from his mid humerus distally.  No significantly increased warmth of the right upper extremity compared to the left.  No significant erythema or induration.  No signs of cellulitis or infection.  Compartments are soft.  He has some mild tenderness with firm palpation of the edematous area without any focal differences in tenderness.  No bony tenderness.  Sensation is intact in all fingers.  Range of motion is intact in the entire extremity with pain at end range of motion of hand flexion and elbow flexion.  Radial and ulnar pulses are 2+.  Capillary refill less than 2 seconds distally in the right upper extremity.    Patient has 1+ pitting edema to the proximal shins bilaterally.  Neurologic: Normal speech and language. Alert and oriented x3. No gross focal neurologic deficits are appreciated.  Skin: Skin is warm, dry and intact. No rash noted.  Port site at right upper chest without tenderness or signs of infection.  Psychiatric: Mood and affect are normal. Speech and behavior are normal.    Radiology     PVL Venous Duplex Upper Extremity Right         CT Chest W Contrast    (Results Pending) Labs     Labs Reviewed   PROTIME-INR - Abnormal; Notable for the following components:       Result Value    PT 14.1 (*)     All other components within normal limits   BASIC METABOLIC PANEL - Abnormal; Notable for the following components:    Sodium 134 (*)     All other components within normal limits   CBC W/ AUTO DIFF - Abnormal; Notable for the following components:    WBC 3.3 (*)     RBC 2.21 (*)     HGB 7.7 (*)     HCT 23.5 (*)     MCV 106.4 (*)     MCH 34.6 (*)     RDW 15.4 (*)     Neutrophil Left Shift 2+ (*)     Absolute Lymphocytes 0.5 (*)     Absolute Monocytes 0.1 (*)     Macrocytosis Marked (*)  Hypochromasia Slight (*)     All other components within normal limits    Narrative:     Please use the Absolute Differential for reference ranges.    SLIDE REVIEW - Abnormal; Notable for the following components:    Smear Review Comments See Comment (*)     Dohle Bodies Present (*)     Toxic Granulation Present (*)     All other components within normal limits   POCT GLUCOSE, INTERFACED - Normal   CBC W/ DIFFERENTIAL    Narrative:     The following orders were created for panel order CBC w/ Differential.  Procedure                               Abnormality         Status                     ---------                               -----------         ------                     CBC w/ Differential[[id number]]         Abnormal            Final result               Morphology Review[[id number]]           Abnormal            Final result                 Please view results for these tests on the individual orders.   APTT     Pertinent labs & imaging results that were available during my care of the patient were reviewed by me and considered in my medical decision making (see chart for details).    Please note- This chart has been created using [employer]. Chart creation errors have been sought, but may not always be located and such creation errors, especially pronoun confusion, do NOT reflect on the standard of medical care.    Konrad Penta, MD  EM PGY-1     Konrad Penta, MD  Resident  09/10/20 1610       Konrad Penta, MD  Resident  09/10/20 [phone]

## 2020-09-10 NOTE — Unmapped (Signed)
.  Pt rounds complete. The following needs have been addressed: stretcher low and locked, call light within reach, pt belongings addressed, toileting addressed. Pt in NAD, family at bedside. No needs or concerns at this time.

## 2020-09-10 NOTE — Unmapped (Signed)
Patient Transport to PVL patient wife sitting in the room awaiting pt return.

## 2020-09-10 NOTE — Unmapped (Signed)
Pt reports having blood clots in right arm X1-2 months. Was told to come to [facility] ED for further eval since cancer doctor here.

## 2020-09-10 NOTE — Unmapped (Signed)
.  Patient rounding complete, call bell in reach, bed locked and in lowest position, patient belongings at bedside and within reach of patient.  Patient updated on plan of care.      Pt paced on cardiac monitor, continuous pulse oximeter and continuous blood pressure monitoring.     Patient family sitting at the bedside with patient.    Pt appears to be very cold. Two warm blankets applied to pt for comfort.

## 2020-09-11 LAB — CBC W/ AUTO DIFF
BASOPHILS ABSOLUTE COUNT: 0 10*9/L (ref 0.0–0.1)
BASOPHILS RELATIVE PERCENT: 1 %
EOSINOPHILS ABSOLUTE COUNT: 0.1 10*9/L (ref 0.0–0.4)
EOSINOPHILS RELATIVE PERCENT: 1.3 %
HEMATOCRIT: 22.5 % — ABNORMAL LOW (ref 41.0–53.0)
HEMOGLOBIN: 7.4 g/dL — ABNORMAL LOW (ref 13.5–17.5)
LARGE UNSTAINED CELLS: 2 % (ref 0–4)
LYMPHOCYTES ABSOLUTE COUNT: 0.7 10*9/L — ABNORMAL LOW (ref 1.5–5.0)
MEAN CORPUSCULAR HEMOGLOBIN CONC: 33 g/dL (ref 31.0–37.0)
MEAN CORPUSCULAR HEMOGLOBIN: 34.7 pg — ABNORMAL HIGH (ref 26.0–34.0)
MEAN CORPUSCULAR VOLUME: 105.3 fL — ABNORMAL HIGH (ref 80.0–100.0)
MEAN PLATELET VOLUME: 9.7 fL (ref 7.0–10.0)
MONOCYTES ABSOLUTE COUNT: 0.1 10*9/L — ABNORMAL LOW (ref 0.2–0.8)
MONOCYTES RELATIVE PERCENT: 3.2 %
NEUTROPHILS ABSOLUTE COUNT: 3.2 10*9/L (ref 2.0–7.5)
NEUTROPHILS RELATIVE PERCENT: 76.8 %
PLATELET COUNT: 208 10*9/L (ref 150–440)
RED BLOOD CELL COUNT: 2.14 10*12/L — ABNORMAL LOW (ref 4.50–5.90)
RED CELL DISTRIBUTION WIDTH: 15.6 % — ABNORMAL HIGH (ref 12.0–15.0)
WBC ADJUSTED: 4.2 10*9/L — ABNORMAL LOW (ref 4.5–11.0)

## 2020-09-11 LAB — MEAN CORPUSCULAR HEMOGLOBIN: Erythrocyte mean corpuscular hemoglobin:EntMass:Pt:RBC:Qn:Automated count: 34.7 — ABNORMAL HIGH

## 2020-09-11 LAB — HEPARIN UNFRACTIONATED: Lab: 0.23

## 2020-09-11 LAB — HEPARIN CORRELATION: Lab: 0.2

## 2020-09-11 MED ORDER — APIXABAN 5 MG TABLET
ORAL_TABLET | Freq: Two times a day (BID) | ORAL | 0 refills | 0.00000 days | Status: CN
Start: 2020-09-11 — End: 2020-09-17

## 2020-09-11 MED ORDER — POLYETHYLENE GLYCOL 3350 17 GRAM ORAL POWDER PACKET
PACK | Freq: Two times a day (BID) | ORAL | 0 refills | 7 days | Status: CN | PRN
Start: 2020-09-11 — End: 2020-10-11

## 2020-09-11 MED ORDER — INSULIN GLARGINE (U-100) 100 UNIT/ML SUBCUTANEOUS SOLUTION
Freq: Every evening | SUBCUTANEOUS | 0 refills | 30 days | Status: CN
Start: 2020-09-11 — End: 2020-10-11

## 2020-09-11 MED ORDER — OXYCODONE 10 MG TABLET
ORAL_TABLET | Freq: Four times a day (QID) | ORAL | 0 refills | 3 days | Status: CN | PRN
Start: 2020-09-11 — End: 2020-09-16

## 2020-09-11 MED ORDER — APIXABAN 5 MG TABLET: 0 refills | 0 days

## 2020-09-11 MED ORDER — LISINOPRIL 20 MG TABLET
ORAL_TABLET | Freq: Every day | ORAL | 0 refills | 30.00000 days | Status: CP
Start: 2020-09-11 — End: 2020-10-11

## 2020-09-11 MED ADMIN — ondansetron (ZOFRAN) injection 4 mg: 4 mg | INTRAVENOUS | @ 08:00:00 | Stop: 2020-09-11

## 2020-09-11 MED ADMIN — apixaban (ELIQUIS) tablet 10 mg: 10 mg | ORAL | @ 16:00:00 | Stop: 2020-09-11

## 2020-09-11 MED ADMIN — oxyCODONE (ROXICODONE) immediate release tablet 5 mg: 5 mg | ORAL | @ 02:00:00 | Stop: 2020-09-10

## 2020-09-11 MED ADMIN — sodium chloride (NS) 0.9 % flush 10 mL: 10 mL | INTRAVENOUS | @ 16:00:00 | Stop: 2020-09-11

## 2020-09-11 MED ADMIN — fentaNYL (PF) (SUBLIMAZE) injection 75 mcg: 75 ug | INTRAVENOUS | @ 05:00:00 | Stop: 2020-09-11

## 2020-09-11 MED ADMIN — calcium carbonate (TUMS) chewable tablet 200 mg of elem calcium: 200 mg | ORAL | @ 21:00:00 | Stop: 2020-09-11

## 2020-09-11 MED ADMIN — heparin (porcine) 1000 unit/mL injection 5,750 Units: 80 [IU]/kg | INTRAVENOUS | @ 07:00:00 | Stop: 2020-09-11

## 2020-09-11 MED ADMIN — oxyCODONE (ROXICODONE) immediate release tablet 10 mg: 10 mg | ORAL | @ 08:00:00 | Stop: 2020-09-11

## 2020-09-11 MED ADMIN — heparin 25,000 Units/250 mL (100 units/mL) in 0.45% saline infusion (premade): 18 [IU]/kg/h | INTRAVENOUS | @ 07:00:00 | Stop: 2020-09-11

## 2020-09-11 MED ADMIN — iohexoL (OMNIPAQUE) 350 mg iodine/mL solution 150 mL: 150 mL | INTRAVENOUS | @ 05:00:00 | Stop: 2020-09-11

## 2020-09-11 NOTE — Unmapped (Signed)
ED Progress Note    9:00 PM    Assumed care of patient from Dr. Misty Stanley.     ED I-PASS Handoff  ?? Patient Summary: Ivan Banks is a 59 y.o. male w/ a h/o HTN, anxiety, DM, anemia, and recently discovered sarcome on forearm w/ metastasis to axilla who presents with worsening clot burden of RUE DVT.   ?? Action List:   ?? F/U CT chest   ?? F/U hematology recs on anticoagulation     12:57 AM  Spoke with hematology fellow Ivan Banks who advised that patient patient's full anticoagulation plan who be addressed by the day hematology service and that in the meantime patient should be started on LMWH or heparin gtt. Given that his CT has not been completed and he may require procedures I opted for gtt heparin. I contacted hospitalist service for admission.      2:02 AM  CT notable for new progression of disease including possible tumor invasion into R brachiocephalic vein and tumor thrombus. Also new GGOs and nodular pulmonary opacities. Communicated results with inpatient team. Patient admitted to oncology service.

## 2020-09-11 NOTE — Unmapped (Addendum)
Adult Nutrition Assessment Note    Visit Type: MD Consult  Reason for Visit: Assessment (Nutrition)      HPI & PMH:  Ivan Banks is a 59 y.o. male with??undifferentiated sarcoma of??right??forearm, with presumed lung and axillary metastases, recent diagnosis of occlusive thrombus to right mid brachial vein on 9/15 who presented to the [facility] ED after worsening of pain and swelling in his right arm and axilla.   ??  Past Medical History:   Diagnosis Date   ??? Anemia    ??? Anxiety    ??? Cancer (CMS-HCC)    ??? Chronic back pain    ??? Diabetes mellitus (CMS-HCC) 08/08/2020   ??? Hypertension    ??? Substance abuse (CMS-HCC)          Anthropometric Data:  Height:   185.4cm  Admission weight:  pending  Last recorded weight:  71.7kg  IBW:  83.6kg  Percent IBW:  85%  BMI: 21    Weight history prior to admission: Weight increase since 06/30/20   Wt Readings from Last 10 Encounters:   09/02/20 71.7 kg (158 lb)   09/02/20 71.8 kg (158 lb 3.2 oz)   08/12/20 68 kg (150 lb)   08/12/20 67.4 kg (148 lb 9.6 oz)   08/08/20 64.4 kg (142 lb)   08/01/20 63.5 kg (140 lb)   07/30/20 64.4 kg (142 lb)   07/29/20 63.7 kg (140 lb 8 oz)   07/21/20 68.9 kg (152 lb)   06/30/20 68.9 kg (152 lb)        Weight changes this admission: There were no vitals filed for this visit.     Nutrition Focused Physical Exam:             Nutrition Evaluation  Nutrition Designation: Normal weight (BMI 18.50 - 24.99 kg/m2) (09/11/20 1047)         NUTRITIONALLY RELEVANT DATA     Medications:   Nutritionally pertinent medications reviewed and evaluated for potential food and/or medication interactions.     Labs:   Nutritionally pertinent labs reviewed.     Nutrition History:   September 11, 2020: Prior to admission: Pt past summer fatigued with ca tx and new DM dx. He had lossed  a lot of weight.  In recent weeks appetite back to baseline with 16# weight gain. He reports smoking marijuana helped appetite, Medical Team aware per Pt report and CAPP rounds. He has friend with DM which has been helping him and has appt to attend DM class in [place]. His BG at home 120's.  We briefly discussed CHO guide for now. Still having swelling in his arm    Allergies, Intolerances, Sensitivities, and/or Cultural/Religious Dietary Restrictions: none identified at this time     Current Nutrition:  Oral intake        Nutrition Orders   (From admission, onward)             Start     Ordered    09/11/20 0323  Nutrition Therapy Regular/House  Effective now     Question:  Nutrition Therapy:  Answer:  Regular/House    09/11/20 0322                   Nutritional Needs:   Energy: 1610-9604 kcals 30-35 kcal/kg using last recorded weight, 71 kg (09/11/20 1047)]  Protein: 85-107 gm [1.2-1.5 gm/kg using last recorded weight, 71 kg (09/11/20 1047)]  Carbohydrate:   [45-60% of kcal]  Fluid: 2130-2485 mL [1 mL/kcal (maintenance)]  Malnutrition Assessment using AND/ASPEN Clinical Characteristics:    Patient does not meet AND/ASPEN criteria for malnutrition at this time (09/11/20 1048)       GOALS and EVALUATION     ??? Prevent weight loss >1% per [facility]    Motivation, Barriers, and Compliance:  Evaluation of motivation, barriers, and compliance completed. No concerns identified at this time.     NUTRITION ASSESSMENT     ??? POC 72, Pt on insulin management  ??? Diet may continue regular, guide for CHO given, as Pt is lower IBW% and hx weight loss.       Discharge Planning:   Monitor via CAPP rounds for any discharge planning needs.      NUTRITION INTERVENTIONS and RECOMMENDATION     1. Continue regular diet   2. DM diet information given, CHO guide 60g each meal and 30g snack BID between meals; Emphasized protein each meal/snack  3. He is planned for DM class Outpt  4. Offer Glucerna PRN    Follow-Up Parameters:   1-2 times per 4 week period (and more frequent as indicated)    Ed Blalock, MS, RD, CSO, LDN  Pager # [phone]

## 2020-09-11 NOTE — Unmapped (Signed)
Hem/Onc Plan of Care Note        Patient Name: Ivan Banks  MRN: [id number]    Ivan Banks is a 59 y.o. with PMH significant for undifferentiated sarcoma right forearm, with presumed lung and axillary metastases. 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    Patient was found to have occlusive thrombus in right mid brachial vein on PVL ultrasound 08/06/2020. He had initially presented with right arm pain. Patient reports having an IV at the site of the swelling that infiltrated during hospitalization for elevated glucose levels, and that pain and swelling (both at immediate IV site as well as distal arm) started while IV was in place. He was started on apixaban 5 mg BID at that time.     He is followed by Dr. Duffy Rhody at [facility] and was last seen 10/12. At that visit, he reported significant right axillary pain.     He presented to [facility] ED today. Repeat PVL showed new obstruction in the basilic vein. Evidence of continued obstruction is visualized in the brachial vein. Besides basilic vein thrombus, other findings appeared essentially unchanged as compared to prior exam of 08/06/20.     Assessment/Plan:   Patient has a history of undifferentiated sarcoma right forearm, with presumed lung and axillary metastases. Persistent edema and new basilic vein thrombus is concerning for possible disease progression causing more proximal venous obstruction with distal venous stasis. Port is also located on right side which may be contributing to more distal venous stasis.     - Check anti-Xa level  - Check apixaban level  - Recommend CT chest with contrast to further evaluate proximal vasculature and/or possible disease progression  - Full consult to follow in the AM    Clemens Catholic, MD  Hematology/Oncology Fellow  Pager: [phone]

## 2020-09-11 NOTE — Unmapped (Cosign Needed)
Oncology (MEDO) History & Physical    Assessment & Plan:   Ivan Banks is a 59 y.o. 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     Principal Problem:    Right arm pain  Active Problems:    Sarcoma (CMS-HCC)    New onset type 1 diabetes mellitus, uncontrolled (CMS-HCC)    Acute deep vein thrombosis (DVT) of brachial vein of right upper extremity (CMS-HCC)  Resolved Problems:    * No resolved hospital problems. *    Right axillary pain - Mid brachial vein thrombus: Worsening swelling, pain to right arm. CT notable for new progression of disease including possible tumor invasion into R brachiocephalic vein and tumor thrombus, as well as bulky lymph node with mass effect on R brachiocephalic vein. On Apixaban 5 mg BID.  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   - Notify  Dr. Essie Christine of new findings and touch base RE next steps    GGOs and nodular pulmonary opacities: Interval development of focal regions of groundglass opacification in the bilateral lungs (for example, see 2:53, 2:66) and bilateral nodular consolidative opacities, right greater than left. Patient denies symptoms, including cough or SOB. No hypoxia, afebrile. COVID negative. Unclear etiology. Patient is not neutropenic so fungal infection somewhat less likely.  - CTM for symptoms and consider further work up as needed  - Consider RPP/COVID, though would except patient to be symptomatic    Cancer-related pain:  - Continue home Xtampza, verify with pharmacy in am  - Continue oxycodone 10 mg q6h prn  - Start gabapentin 100 mg nightly, as recently prescribed outpatient with plan to uptitrate to TID    New-onset T1DM: Hold home metformin.  - Continue glargine 15 units nightly  - SSI    Daily Checklist:  Diet: Regular Diet  DVT PPx: Patient Already on Full Anticoagulation with heparin  Electrolytes: No Repletion Needed  Code Status: Full Code    Chief Concern:   Right arm pain    Subjective:   HPI:  Ivan Banks is a 59 y.o. 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 He was instructed to come in after reporting these symptoms to his oncology team. He denies any other symptoms, including cough, SOB, chest pain, abdominal pain, nausea/vomiting, diarrhea, burning with urination or edema. He does endorse some mild constipation.    He follows with Dr. Essie Christine and [facility] palliative care. s/p palliative XRT to the R forearm and R axilla finished 08/22/2019. Had palliative XRT to L forearm in 11/2019.  07/31/2020 hospitalized and diagnosed with new onset type 1 diabetes mellitus. 07/2020 with Occlusive thrombus seen within the mid right brachial vein. He was enrolled on ??[id number] olaparib trial for his IDH-mutated disease but taken off-study after 2 cycles of olaparib  due to mixed response and progression on imaging. Started Doxorubicin as second line therapy. 07/25/20 scans showing decrease in size of pulmonary metastasis and worsening nodal and left posterior chest wall metastasis.     Designated Environmental health practitioner:  Mr. Wolman currently has decisional capacity for healthcare decision-making and is able to designate a surrogate healthcare decision maker. Mr. Gutridge designated healthcare decision maker(s) is/are Jaymes Graff and Rosey Bath (the patient's twin sisters) as denoted by stated patient preference.    Allergies:  Venom-honey bee, Aspirin, and Morphine    Medications:   Prior to Admission medications    Medication Dose, Route, Frequency   apixaban (ELIQUIS) 5 mg Tab 5 mg, Oral, 2 times a day (standard)   blood sugar diagnostic (GLUCOSE BLOOD) Strp Use to check blood sugar as directed with insulin 3 times a day & for symptoms of high or low blood sugar.   blood-glucose meter kit Use as instructed   blood-glucose meter kit Use as instructed   EPINEPHrine (EPIPEN) 0.3 mg/0.3 mL injection 0.3 mg, Intramuscular, Once   fluoride, sodium, (CLINPRO 5000) 1.1 % Pste Dental, Daily (standard), Brush on teeth twice daily, do not rinse, spit out excess leave on teeth for at least one hr before eating or drinking   FREESTYLE LITE STRIPS Strp Test daily before all meals/snacks and once before bedtime.   gabapentin (NEURONTIN) 100 MG capsule 100 mg, Oral, Nightly, X 2 nights and if tolerating, then increase to 3 caps (300 mg) every night   insulin glargine (LANTUS SOLOSTAR U-100 INSULIN) 100 unit/mL (3 mL) injection pen 15 Units, Subcutaneous, Daily (standard)   lancets (FREESTYLE) 28 gauge Misc Test daily before all meals/snacks and once before bedtime.   lancets (FREESTYLE) 28 gauge Misc Test daily before all meals/snacks and once before bedtime.   lancets Misc Use to check blood sugar as directed with insulin 3 times a day & for symptoms of high or low blood sugar.   lisinopriL (PRINIVIL,ZESTRIL) 20 MG tablet 20 mg, Oral, Daily (standard)   metFORMIN (GLUCOPHAGE-XR) 500 MG 24 hr tablet 500 mg, Oral, Daily   naloxone (NARCAN) 4 mg nasal spray One spray in either nostril once for known/suspected opioid overdose. May repeat every 2-3 minutes in alternating nostril til EMS arrives   OLANZapine (ZYPREXA) 5 MG tablet 5 mg, Oral, Nightly, Take for the first 4 days after chemotherapy ondansetron (ZOFRAN) 4 MG tablet 4 mg, Oral, Daily PRN   ondansetron (ZOFRAN) 8 MG tablet 8 mg, Oral, Every 8 hours PRN   oxyCODONE (ROXICODONE) 10 mg immediate release tablet 10 mg, Oral, Every 8 hours PRN   oxyCODONE myristate (XTAMPZA ER) 18 mg CSpT 18 mg, Oral, Every 12 hours   pegfilgrastim-cbqv (UDENYCA) 6 mg/0.6 mL injection Inject the contents of 1 syringe (0.42mL) under the skin once every 21 days. Inject 24-72 hours after last chemotherapy dose on day 1.   pen needle, diabetic 31 gauge x 1/4 (6 mm) Ndle Use for insulin administration daily   polyethylene glycol (MIRALAX) 17 gram/dose powder 17 g, Oral, 2 times a day PRN   prochlorperazine (COMPAZINE) 10 MG tablet 10 mg, Oral, Every 6 hours PRN   senna (SENOKOT) 8.6 mg tablet 1 tablet, Oral, 2 times a day PRN   silver sulfaDIAZINE (SILVADENE, SSD) 1 % cream Apply to affected area daily       Medical History:  Past Medical History:   Diagnosis Date   ??? Anemia    ??? Anxiety    ??? Cancer (CMS-HCC)    ???  Chronic back pain    ??? Diabetes mellitus (CMS-HCC) 08/08/2020   ??? Hypertension    ??? Substance abuse (CMS-HCC)        Surgical History:  Past Surgical History:   Procedure Laterality Date   ??? IR INSERT PORT AGE GREATER THAN 5 YRS  08/12/2020    IR INSERT PORT AGE GREATER THAN 5 YRS 08/12/2020 Jobe Gibbon, MD IMG VIR [facility]   ??? KNEE ARTHROSCOPY Left 1990       Family History:   Family History   Problem Relation Age of Onset   ??? Cancer Mother    ??? Diabetes Father    ??? Kidney disease Father    ??? Diabetes Sister    ??? Lupus Sister    ??? No Known Problems Brother        Social History:  The patient uses marijuana.    Social History     Tobacco Use   ??? Smoking status: Never Smoker   ??? Smokeless tobacco: Never Used   Vaping Use   ??? Vaping Use: Never used   Substance Use Topics   ??? Alcohol use: Not Currently   ??? Drug use: Yes     Frequency: 21.0 times per week     Types: Marijuana, Benzodiazepines     Comment: Smokes 2-3x a day        Review of Systems:  10 systems were reviewed and are negative unless otherwise mentioned in the HPI    Objective:   Physical Exam:  Temp:  [36.6 ??C-36.8 ??C] 36.7 ??C  Heart Rate:  [76-89] 76  SpO2 Pulse:  [76-89] 76  Resp:  [12-19] 12  BP: (124-175)/(65-86) 124/68  SpO2:  [93 %-100 %] 93 %    Gen: Chronically ill appearing man lying in bed in NAD  HENT: MMM. OP w/o erythema or exudate. Poor dentition.  Heart: RRR, S1, S2, no M/R/G. Port in place in right chest without tenderness or swelling.  Lungs: CTAB, no crackles or wheezes, no use of accessory muscles  Abdomen: Normoactive bowel sounds, soft, nontender, nondistended  Extremities: Sequale of prior radiation to right forearm. Swelling to right upper extremity.  Neuro: CN II-XI grossly intact, normal cerebellar function, normal gait. No focal deficits.  Skin:  No rashes, lesions on clothed exam  Psych: Alert, oriented, normal mood and affect.     Labs/Studies/Imaging:  Labs, Studies, Imaging from the last 24hrs per EMR and personally reviewed    Maeola Sarah, MD, PGY-2  [facility] Internal Medicine

## 2020-09-11 NOTE — Unmapped (Signed)
Patient return back form PVL .

## 2020-09-11 NOTE — Unmapped (Signed)
Disposition: In Process    Final diagnoses:   Acute deep vein thrombosis (DVT) of brachial vein of right upper extremity (CMS-HCC) (Primary)       Assumed care of patient from the previous team.      Accepted turnover from Dr. Eulah Pont at 1500.  In brief 59 year old male with history of sarcoma to right forearm.  Presents for progressive right upper extremity swelling in the setting of known DVT currently on Eliquis.  Pending Doppler ultrasound and consultation with heme-onc and basic labs.    Ultrasound shows progression of DVT now involving basilic vein in addition to reconfirmation of brachial vein DVT.  Possible extension of the axillary vein as well.  White blood cell count down to 3.3.  Otherwise no marked abnormalities in labs.    Heme-onc consulted and requested CT chest with contrast, ordered it 1928.  Heme-onc will continue with full consult following CT scan.    At time of turnover pending CT chest with contrast and repage heme-onc for additional recommendations.  As patient has an expanding DVT failing outpatient therapy with Eliquis anticipate possible admission versus close outpatient follow-up.  Vitals:    09/10/20 2100   BP: 131/71   Pulse: 77   Resp: 16   Temp:    SpO2: 94%       Pedro Earls, MD  11:06 PM

## 2020-09-11 NOTE — Unmapped (Addendum)
** Action List **   [  ] Follow up with your oncology team, Pam Drown FNP, on 09/17/20 at 10:30 AM at [facility] Hematology Oncology 2nd [facility].   [  ] Follow up with your primary oncologist, Dr. Dorna Mai, on 09/23/20 at 12:00 PM at [facility]  2nd [facility].     Ivan Banks is a 59 y.o. male with??undifferentiated sarcoma of??right??forearm, with presumed lung and axillary metastases, recent diagnosis of occlusive thrombus to right mid brachial vein on 9/15 who presented to the [facility] ED after worsening of pain and swelling in his right arm and axilla.     Right axillary pain - Mid brachial vein thrombus:   Patient with a known R UE DVT since 9/21 with worsening swelling, tightness, and pain to right arm since. Is on Eliquis 5mg  BID and states compliance without any missed doses. Had an outpatient CT scheduled for 09/23/20 but became too unbearable and swollen so decided to come to [facility] ED. ED ordered RUE PVL which showed non-compressible brachial veins, basilic vein, and commented findings were essentially unchanged compared to prior exam on 08/06/20. Follow up CT Chest w contrast was notable for severe luminal narrowing of R brachiocephalic v by a bulky superior mediastinal LN with questionable invasion of vessel with resultant tumor thrombus. Also stated widespread bulky, necrotic mediastinal and upper abd lymphadenopathy and multifocal metastatic foci in b/l lungs which is all similar to prior. He was heparin loaded and started on heparin drip overnight while an apixaban level was ordered. However, interpretation of apixaban levels is unreliable. When morning oncology team went to speak to patient, he continued to endorse compliance and stated he took 1 pill daily in the morning. When asked if he took a second tablet at night, patient stated he was only instructed to take 1 tablet per day after his initial loading of 2 tablets BID. While compliant patient was likely subtherapeutic with his Eliquis dosage which would explain the progression of his DVT. We do not feel this is a true failure of Eliquis and for this reason will discharge him with a re-loading dose of Eliquis to get to therapeutic level and strict instructions to take Eliquis 5mg  BID indefinitely going forward. This should provide sufficient AC to stabilize the clot and allow for his body to resorb it over time without need for further intervention.   ??  Undifferentiated sarcoma??of right??forearm, with presumed lung and axillary metastases:   Patient stated he had received a total of 40 rounds of radiation, 20 per forearm 2/2 sarcoma masses, last in February 2021. CT findings concerning for progression of disease on Doxorubicin, though he has only recently started this therapy. Patient endorsed b/l ankle edema over the past 1 week. Physical exam confirms +1 pitting edema of b/l ankles not extending proximal to ankle joint.     - Follow CT imaging with outpatient oncology   - Consider b/l ankle edema in setting of recent Doxorubicin start.   ??  GGOs and nodular pulmonary opacities:   Interval development of focal regions of groundglass opacification in the bilateral lungs (for example, see 2:53, 2:66) and bilateral nodular consolidative opacities, right greater than left. Patient denies symptoms, including cough or SOB. No hypoxia, afebrile. COVID negative. Unclear etiology. Patient is not neutropenic so fungal infection somewhat less likely.  - CTM for symptoms and consider further work up as needed  ??  Cancer-related pain:  - Continue home Xtampza, verify with pharmacy in am  -  Continue oxycodone 10 mg q6h prn  - Was recently started on gabapentin 100 mg nightly for neuropathic back pain but states he has not yet started this medication d/t concerns about side effects of dizziness and fatigue.   ??  New-onset T1DM:   States this is a new diagnosis 2/2 adverse effects of chemotherapy.   Held home metformin during admission, can resume on discharge.

## 2020-09-11 NOTE — Unmapped (Cosign Needed)
Benign Hematology/Coagulation Consult H&P    Service requesting consult: Emergency Medicine  Referring Physician: Jennye Moccasin, MD  Reason for Consult: Concern for failed anticoagulation therapy    ASSESSMENT and PLAN    Ivan Banks is a 59 y.o. 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     Problem List:   Deep Vein Thrombosis - First noted on 9/15 right upper extremity doppler US: occlusive thrombus seen within the mid-right brachial vein. Patient was started on apixaban 10 mg BID for one week, but then reports only taking 5 mg daily over the past 2 weeks. He presented again yesterday with worsening RUE edema and pain. Repeat PVL showed new obstruction in the basilic vein. Evidence of continued obstruction is visualized in the brachial vein. Besides basilic vein thrombus, other findings appeared essentially unchanged as compared to prior exam of 08/06/20.     Given the proximal narrowing of the right brachiocephalic vein and the fact patient was only taking full therapeutic dose of apixaban, we would not consider this treatment failure. As stated below, we recommend reload of apixaban (10 mg BID for 1 week) followed by 5 mg BID as long as patient has had ongoing malignancy.     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 This is likely causing significant venous stasis in the right arm with increased risk of thrombosis. Given the severity of edema and pain patient has been experiencing, vascular surgery consult for possible stent placement may be helpful. However, reducing tumor burden with cancer-directed treatments is, in the long-term, most important.     Recommendations:  - start apixaban 10 mg BID for 7 days, followed by 5 mg BID  - Would recommend vascular surgery consult for possible stenting of severe luminal narrowing of the right brachiocephalic vein given patient's right arm pain    This patient has been staffed with Dr. Despina Hick. These recommendations were discussed with the primary team.     Please contact the benign hematology/coagulation team at [phone] with any further questions.    Clemens Catholic, MD  Hematology/Oncology Fellow  Pager: [phone]  September 11, 2020     HISTORY OF PRESENT ILLNESS   Ivan Banks is a 59 y.o. 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     Repeat PVL showed new obstruction in the basilic vein. Evidence of continued obstruction is visualized in the brachial vein. Besides basilic vein thrombus, other findings appeared essentially unchanged as compared to prior exam of 08/06/20.     Patient was enrolled on [id number] olaparib trial for his IDH-mutated disease but taken off-study after 2 cycles of olaparib due to mixed response and progression on imaging. He is followed by Dr. Duffy Rhody at [facility] and was last seen 10/12. At that visit, he reported significant right axillary pain.   ??  He presented to [facility] ED 10/20. Repeat PVL showed new obstruction in the basilic vein. Evidence of continued obstruction is visualized in the brachial vein. Besides basilic vein thrombus, other findings appeared essentially unchanged as compared to prior exam of 08/06/20    PAST MEDICAL HISTORY      Past  Medical History:   Diagnosis Date   ??? Anemia    ??? Anxiety    ??? Cancer (CMS-HCC)    ??? Chronic back pain    ??? Diabetes mellitus (CMS-HCC) 08/08/2020   ??? Hypertension    ??? Substance abuse (CMS-HCC)        PAST SURGICAL HISTORY     Past Surgical History:   Procedure Laterality Date   ??? IR INSERT PORT AGE GREATER THAN 5 YRS  08/12/2020    IR INSERT PORT AGE GREATER THAN 5 YRS 08/12/2020 Jobe Gibbon, MD IMG VIR [facility]   ??? KNEE ARTHROSCOPY Left 1990       SOCIAL HISTORY     Social History     Tobacco Use   Smoking Status Never Smoker   Smokeless Tobacco Never Used     Social History     Substance and Sexual Activity   Alcohol Use Not Currently     Social History     Social History Narrative   ??? Not on file       FAMILY HISTORY     Family History   Problem Relation Age of Onset   ??? Cancer Mother    ??? Diabetes Father    ??? Kidney disease Father    ??? Diabetes Sister    ??? Lupus Sister    ??? No Known Problems Brother        MEDICATIONS and ALLERGIES     No current facility-administered medications on file prior to encounter.     Current Outpatient Medications on File Prior to Encounter   Medication Sig Dispense Refill   ??? apixaban (ELIQUIS) 5 mg Tab Take 1 tablet (5 mg total) by mouth Two (2) times a day. 180 tablet 3   ??? blood sugar diagnostic (GLUCOSE BLOOD) Strp Use to check blood sugar as directed with insulin 3 times a day & for symptoms of high or low blood sugar. 100 strip 0   ??? blood-glucose meter kit Use as instructed 1 each 0   ??? blood-glucose meter kit Use as instructed 1 each 0   ??? EPINEPHrine (EPIPEN) 0.3 mg/0.3 mL injection Inject 0.3 mL (0.3 mg total) into the muscle once for 1 dose. 0.3 mL 0   ??? fluoride, sodium, (CLINPRO 5000) 1.1 % Pste Apply to teeth daily. Brush on teeth twice daily, do not rinse, spit out excess leave on teeth for at least one hr before eating or drinking 100 mL 5   ??? FREESTYLE LITE STRIPS Strp Test daily before all meals/snacks and once before bedtime. 3 each 0   ??? gabapentin (NEURONTIN) 100 MG capsule Take 1 capsule (100 mg total) by mouth nightly. X 2 nights and if tolerating, then increase to 3 caps (300 mg) every night 270 capsule 0   ??? insulin glargine (LANTUS SOLOSTAR U-100 INSULIN) 100 unit/mL (3 mL) injection pen Inject 0.15 mL (15 Units total) under the skin daily. 4.5 mL 0   ??? lancets (FREESTYLE) 28 gauge Misc Test daily before all meals/snacks and once before bedtime. 100 each 0   ??? lancets (FREESTYLE) 28 gauge Misc Test daily before all meals/snacks and once before bedtime. 3 each 0   ??? lancets Misc Use to check blood sugar as directed with insulin 3 times a day & for symptoms of high or low blood sugar. 100 each 0   ??? lisinopriL (PRINIVIL,ZESTRIL) 20 MG tablet Take 1 tablet (20 mg total) by mouth daily. 90 tablet 2   ???  metFORMIN (GLUCOPHAGE-XR) 500 MG 24 hr tablet Take 1 tablet (500 mg total) by mouth every morning before breakfast. 90 tablet 3   ??? naloxone (NARCAN) 4 mg nasal spray One spray in either nostril once for known/suspected opioid overdose. May repeat every 2-3 minutes in alternating nostril til EMS arrives 2 each 0   ??? OLANZapine (ZYPREXA) 5 MG tablet Take 1 tablet (5 mg total) by mouth nightly. Take for the first 4 days after chemotherapy 4 tablet 5   ??? ondansetron (ZOFRAN) 4 MG tablet Take 1 tablet (4 mg total) by mouth daily as needed for nausea. 30 tablet 1   ??? ondansetron (ZOFRAN) 8 MG tablet Take 1 tablet (8 mg total) by mouth every eight (8) hours as needed for nausea (or vomiting). 30 tablet 2   ??? oxyCODONE (ROXICODONE) 10 mg immediate release tablet Take 1 tablet (10 mg total) by mouth every eight (8) hours as needed for pain. 90 tablet 0   ??? oxyCODONE myristate (XTAMPZA ER) 18 mg CSpT Take 18 mg by mouth every twelve (12) hours. 60 each 0   ??? pegfilgrastim-cbqv (UDENYCA) 6 mg/0.6 mL injection Inject the contents of 1 syringe (0.49mL) under the skin once every 21 days. Inject 24-72 hours after last chemotherapy dose on day 1. 0.6 mL 4   ??? pen needle, diabetic 31 gauge x 1/4 (6 mm) Ndle Use for insulin administration daily 100 each 0   ??? polyethylene glycol (MIRALAX) 17 gram/dose powder Take 17 g by mouth two (2) times a day as needed (constipation). 850 g 0   ??? prochlorperazine (COMPAZINE) 10 MG tablet Take 1 tablet (10 mg total) by mouth every six (6) hours as needed (nausea or vomitting). 30 tablet 2   ??? senna (SENOKOT) 8.6 mg tablet Take 1 tablet by mouth two (2) times a day as needed for constipation. 60 tablet 0   ??? silver sulfaDIAZINE (SILVADENE, SSD) 1 % cream Apply to affected area daily 50 g 3       REVIEW OF SYSTEMS     Negative except as noted in the HPI    PHYSICAL EXAM:      Vitals:    09/11/20 0800   BP: 137/72   Pulse: 79   Resp: 10   Temp:    SpO2: 94%     Physical Exam:  GEN: Well appearing, no acute distress  HEENT: Pupils equally round, sclerae anicteric, conjunctiva clear, moist mucous membranes, no oral lesions or exudates  NECK: Supple, no JVD  HEME/LYMPH: No palpable cervical or supraclavicular lymphadenopathy  CV: Normal rate, regular rhythm, normal S1 and S2, no murmurs, rubs, or gallops; extremities warm, well-perfused,   RESP: Lungs clear to auscultation bilaterally, no wheezes, ronchi or rales, normal work of breathing  GI: Soft, non-tender, non-distended, normoactive bowel sounds, no masses or organomegaly  EXT: Right upper extremity edema to level of shoulder. ROM in RUE limited 2/2 pain  SKIN AND SUBCUTANEOUS TISSUES: No rashes or ecchymoses    Patient Lines/Drains/Airways Status    Active Peripheral & Central Intravenous Access     Name:   Placement date:   Placement time:   Site:   Days:    Peripheral IV 09/11/20 Left Antecubital   09/11/20    0006    Antecubital   less than 1    Port A Cath 08/12/20 Right Internal jugular   08/12/20    1147    Internal jugular   29  LABORATORY and RADIOLOGY DATA:      Pertinent Laboratory Data:  Lab Results   Component Value Date    WBC 4.2 (L) 09/11/2020    HGB 7.4 (L) 09/11/2020    HCT 22.5 (L) 09/11/2020    PLT 208 09/11/2020     Lab Results   Component Value Date    NA 134 (L) 09/10/2020    K 3.9 09/10/2020    CL 105 09/10/2020    CO2 22.0 09/10/2020     Lab Results   Component Value Date    ALKPHOS 96 09/02/2020    BILITOT 0.2 (L) 09/02/2020    PROT 7.2 09/02/2020    ALBUMIN 3.1 (L) 09/02/2020 ALT 18 09/02/2020    AST 33 09/02/2020     Lab Results   Component Value Date    PT 14.1 (H) 09/10/2020    PT 12.1 05/06/2020    PT 11.2 04/15/2020    INR 1.21 09/10/2020    INR 1.02 05/06/2020    INR 0.94 04/15/2020         CT Chest W Contrast 10/21:  Severe luminal narrowing of the right brachiocephalic vein near its confluence with the left brachiocephalic vein by a bulky superior mediastinal lymph node. Question invasion of the vessel with resultant tumor thrombus at this location, though this is uncertain. The vein remains patent. There is no evidence of bland thrombus in the venous system. The right subclavian vein is poorly opacified and not well evaluated on this study, possibly secondary to known venous thrombosis in the right upper extremity.     Prominent diffuse airway wall thickening. Interval development of multifocal regions of groundglass opacification and bilateral nodular consolidative opacities likely represent sequelae of multifocal infection/aspiration. Small right and trace left pleural effusions.     Widespread bulky, necrotic mediastinal and upper abdominal lymphadenopathy appears grossly similar to prior. Multifocal metastatic foci in the bilateral lungs also appear similar to prior.

## 2020-09-11 NOTE — Unmapped (Signed)
Pt admitted from ED late morning, alert and oriented x4, afebrile with stable VS. Pt with c/o pain, given PRN pain medication x1. Given PO eliquis per order. Up ad lib and independent. Fall precautions and pt safety maintained. Emotional and educational support provided. Planning to discharge pt this afternoon. Will continue to monitor.     Problem: Diabetes Comorbidity  Goal: Blood Glucose Level Within Targeted Range  Outcome: Ongoing - Unchanged     Problem: Adult Inpatient Plan of Care  Goal: Plan of Care Review  Outcome: Ongoing - Unchanged  Goal: Patient-Specific Goal (Individualized)  Outcome: Ongoing - Unchanged  Goal: Absence of Hospital-Acquired Illness or Injury  Outcome: Ongoing - Unchanged  Intervention: Identify and Manage Fall Risk  Recent Flowsheet Documentation  Taken 09/11/2020 1129 by Franne Forts, RN  Safety Interventions:   commode/urinal/bedpan at bedside   fall reduction program maintained   environmental modification   lighting adjusted for tasks/safety   low bed   family at bedside   nonskid shoes/slippers when out of bed  Goal: Optimal Comfort and Wellbeing  Outcome: Ongoing - Unchanged  Goal: Readiness for Transition of Care  Outcome: Ongoing - Unchanged  Goal: Rounds/Family Conference  Outcome: Ongoing - Unchanged

## 2020-09-11 NOTE — Unmapped (Signed)
.  Patient rounding complete, call bell in reach, bed locked and in lowest position, patient belongings at bedside and within reach of patient.  Patient updated on plan of care.      Patient escorted to bathroom and back to his room paced on cardiac monitor, continuous pulse oximeter and continuous blood pressure monitoring.

## 2020-09-11 NOTE — Unmapped (Signed)
Physician Discharge Summary [facility]  4 [facility]C [facility]  [address]  [place] [address]  Dept: [phone]  Loc: [phone]     Identifying Information:   Serenity Rapa  11-06-1961  [id number]    Primary Care Physician: Suanne Marker, MD     Referring Physician: Referred Self     Code Status: Full Code    Admit Date: 09/10/2020    Discharge Date: 09/11/2020     Discharge To: Home    Discharge Service: [facility] - Oncology Floor Team (MEDO)     Discharge Attending Physician: Jennye Moccasin, MD    Discharge Diagnoses:  Principal Problem:    Acute deep vein thrombosis (DVT) of brachial vein of right upper extremity (CMS-HCC)  Active Problems:    Sarcoma (CMS-HCC)    Right arm pain    New onset type 1 diabetes mellitus, uncontrolled (CMS-HCC)    Ankle edema, bilateral  Resolved Problems:    * No resolved hospital problems. *      Outpatient Provider Follow Up Issues:   Follow-up Plan after discharge:  Issues related to hospitalization: none  Follow-up appointment with [facility] Oncology: See below  Oncology specific plans going forward: No change from previously    Patient's primary oncologist and/or nurse navigator: Dr. Dorna Mai.    Warm handoff via Epic message or direct conversation?: Yes.    Hospital Course:                                           ** Action List **   [  ] Follow up with your oncology team, Pam Drown FNP, on 09/17/20 at 10:30 AM at [facility] Hematology Oncology 2nd [facility].   [  ] Follow up with your primary oncologist, Dr. Dorna Mai, on 09/23/20 at 12:00 PM at [facility] Oncology  2nd [facility].     Jatavious Johnathyn Cassone is a 59 y.o. 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     Right axillary pain - Mid brachial vein thrombus:   Patient with a known R UE DVT since 9/21 with worsening swelling, tightness, and pain to right arm since. Had been prescribed eliquis. Had an outpatient CT scheduled for 09/23/20 but became too unbearable and swollen so decided to come to [facility] ED. ED ordered RUE PVL which showed non-compressible brachial veins, basilic vein, and commented findings were essentially unchanged compared to prior exam on 08/06/20. Follow up CT Chest w contrast was notable for severe luminal narrowing of R brachiocephalic v by a bulky superior mediastinal LN with questionable invasion of vessel with resultant tumor thrombus. Also stated widespread bulky, necrotic mediastinal and upper abd lymphadenopathy and multifocal metastatic foci in b/l lungs which is all similar to prior. He was heparin loaded and started on heparin drip overnight. When morning oncology team went to speak to patient, he continued to endorse compliance and stated he took 1 pill daily in the morning. When asked if he took a second tablet at night, patient stated he was only instructed to take 1 tablet per day after his initial loading of 2 tablets daily. We therefore do not feel this is a true  failure of Eliquis and for this reason will discharge him with a re-loading dose of Eliquis to get to therapeutic level and strict instructions to take Eliquis 5mg  BID indefinitely going forward. This should provide sufficient AC to stabilize the clot and allow for his body to resorb it over time without need for further intervention.      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 CT findings concerning for progression of disease but he has only recently started doxorubicin. Patient endorsed b/l ankle edema over the past 1 week. Physical exam confirms +1 pitting edema of b/l ankles not extending proximal to ankle joint.        GGOs and nodular pulmonary opacities: Interval development of focal regions of groundglass opacification in the bilateral lungs (for example, see 2:53, 2:66) and bilateral nodular consolidative opacities, right greater than left. Patient denies symptoms, including cough or SOB. No hypoxia, afebrile. COVID negative. Unclear etiology. Patient is not neutropenic so fungal infection somewhat less likely.  - CTM for symptoms and consider further work up as needed     Cancer-related pain:  - Continue home Xtampza, verify with pharmacy in am  - Continue oxycodone 10 mg q6h prn  - Was recently started on gabapentin 100 mg nightly for neuropathic back pain but states he has not yet started this medication d/t concerns about side effects of dizziness and fatigue.      New-onset T1DM:   States this is a new diagnosis 2/2 adverse effects of chemotherapy.   Held home metformin during admission, can resume on discharge.     Patient does not meet AND/ASPEN criteria for malnutrition at this time (09/11/20 1048)           Procedures:  No admission procedures for hospital encounter.  ______________________________________________________________________  Discharge Medications:     Your Medication List        CHANGE how you take these medications      apixaban 5 mg Tab  Commonly known as: ELIQUIS  Take 1 tablet (5 mg total) by mouth Two (2) times a day.  What changed: Another medication with the same name was added. Make sure you understand how and when to take each.     apixaban 5 mg Tab  Commonly known as: ELIQUIS  Take 2 tablets (10mg ) twice a day from 10/21-10/27. Then take 1 tablet (5mg ) twice a day starting 09/18/20  What changed: You were already taking a medication with the same name, and this prescription was added. Make sure you understand how and when to take each.     ondansetron 8 MG tablet  Commonly known as: ZOFRAN  Take 1 tablet (8 mg total) by mouth every eight (8) hours as needed for nausea (or vomiting).  What changed: Another medication with the same name was removed. Continue taking this medication, and follow the directions you see here.            CONTINUE taking these medications      blood-glucose meter kit  Use as instructed     blood-glucose meter kit  Use as instructed     EPINEPHrine 0.3 mg/0.3 mL injection  Commonly known as: EPIPEN  Inject 0.3 mL (0.3 mg total) into the muscle once for 1 dose.     fluoride (sodium) 1.1 % Pste  Commonly known as: CLINPRO 5000  Apply to teeth daily. Brush on teeth twice daily, do not rinse, spit out excess leave on teeth for at least one hr before eating or drinking     FREESTYLE LITE STRIPS Strp  Generic drug: blood sugar diagnostic  Test daily before all meals/snacks and once before bedtime.     glucose blood Strp  Generic drug: blood sugar diagnostic  Use to check blood sugar as directed with insulin 3 times a day & for symptoms of high or low blood sugar.     gabapentin 100 MG capsule  Commonly known as: NEURONTIN  Take 1 capsule (100 mg total) by mouth nightly. X 2 nights and if tolerating, then increase to 3 caps (300 mg) every night     lancets 28 gauge Misc  Commonly known as: freestyle  Test daily before all meals/snacks and once before bedtime.     lancets 28 gauge Misc  Commonly known as: freestyle  Test daily before all meals/snacks and once before bedtime.     lancets Misc  Use to check blood sugar as directed with insulin 3 times a day & for symptoms of high or low blood sugar.     LANTUS SOLOSTAR U-100 INSULIN 100 unit/mL (3 mL) injection pen  Generic drug: insulin glargine  Inject 0.15 mL (15 Units total) under the skin daily.     lisinopriL 20 MG tablet  Commonly known as: PRINIVIL,ZESTRIL  Take 1 tablet (20 mg total) by mouth daily.     metFORMIN 500 MG 24 hr tablet  Commonly known as: GLUCOPHAGE-XR  Take 1 tablet (500 mg total) by mouth every morning before breakfast.     naloxone 4 mg/actuation nasal spray  Commonly known as: NARCAN  One spray in either nostril once for known/suspected opioid overdose. May repeat every 2-3 minutes in alternating nostril til EMS arrives     OLANZapine 5 MG tablet  Commonly known as: ZyPREXA  Take 1 tablet (5 mg total) by mouth nightly. Take for the first 4 days after chemotherapy     oxyCODONE 10 mg immediate release tablet  Commonly known as: ROXICODONE  Take 1 tablet (10 mg total) by mouth every eight (8) hours as needed for pain.     pen needle, diabetic 31 gauge x 1/4 (6 mm) Ndle  Use for insulin administration daily     polyethylene glycol 17 gram/dose powder  Commonly known as: MIRALAX  Take 17 g by mouth two (2) times a day as needed (constipation).     prochlorperazine 10 MG tablet  Commonly known as: COMPAZINE  Take 1 tablet (10 mg total) by mouth every six (6) hours as needed (nausea or vomitting).     senna 8.6 mg tablet  Commonly known as: SENOKOT  Take 1 tablet by mouth two (2) times a day as needed for constipation.     silver sulfaDIAZINE 1 % cream  Commonly known as: SILVADENE, SSD  Apply to affected area daily     UDENYCA 6 mg/0.6 mL injection  Generic drug: pegfilgrastim-cbqv  Inject the contents of 1 syringe (0.15mL) under the skin once every 21 days. Inject 24-72 hours after last chemotherapy dose on day 1.     XTAMPZA ER 18 mg Cspt  Generic drug: oxyCODONE myristate  Take 18 mg by mouth every twelve (12) hours.              Allergies:  Venom-honey bee, Aspirin, and Morphine  ______________________________________________________________________  Pending Test Results (if blank, then none):  Pending Labs       Order Current Status    CBC w/ Differential Collected (09/11/20 0351)    CBC w/ Differential Collected (09/11/20 0351)    Apixaban In process            Most Recent Labs:  All lab results last 24 hours -   Recent Results (from the past 24 hour(s))   POCT Glucose    Collection Time: 09/10/20  7:48 PM   Result Value Ref Range    Glucose, POC 72 70 - 179 mg/dL   Heparin-Unfractionated    Collection Time: 09/11/20 12:30 AM   Result Value Ref Range Heparin - Unfractionated 0.23 IU/mL   APTT    Collection Time: 09/11/20 12:30 AM   Result Value Ref Range    APTT 31.4 24.9 - 36.9 sec    Heparin Correlation 0.2    COVID-19 PCR    Collection Time: 09/11/20  2:48 AM    Specimen: Nasopharyngeal Swab   Result Value Ref Range    SARS-CoV-2 PCR Negative Negative   Type and Screen    Collection Time: 09/11/20  3:51 AM   Result Value Ref Range    Check Type Need Type Check     ABO Grouping O POS     Antibody Screen NEG    CBC w/ Differential    Collection Time: 09/11/20  3:51 AM   Result Value Ref Range    WBC 4.2 (L) 4.5 - 11.0 10*9/L    RBC 2.14 (L) 4.50 - 5.90 10*12/L    HGB 7.4 (L) 13.5 - 17.5 g/dL    HCT 16.1 (L) 09.6 - 53.0 %    MCV 105.3 (H) 80.0 - 100.0 fL    MCH 34.7 (H) 26.0 - 34.0 pg    MCHC 33.0 31.0 - 37.0 g/dL    RDW 04.5 (H) 40.9 - 15.0 %    MPV 9.7 7.0 - 10.0 fL    Platelet 208 150 - 440 10*9/L    Neutrophils % 76.8 %    Lymphocytes % 15.8 %    Monocytes % 3.2 %    Eosinophils % 1.3 %    Basophils % 1.0 %    Neutrophil Left Shift 2+ (A) Not Present    Absolute Neutrophils 3.2 2.0 - 7.5 10*9/L    Absolute Lymphocytes 0.7 (L) 1.5 - 5.0 10*9/L    Absolute Monocytes 0.1 (L) 0.2 - 0.8 10*9/L    Absolute Eosinophils 0.1 0.0 - 0.4 10*9/L    Absolute Basophils 0.0 0.0 - 0.1 10*9/L    Large Unstained Cells 2 0 - 4 %    Macrocytosis Marked (A) Not Present   POCT Glucose    Collection Time: 09/11/20 11:06 AM   Result Value Ref Range    Glucose, POC 90 70 - 179 mg/dL       Relevant Studies/Radiology (if blank, then none):  ECG 12 Lead    Result Date: 09/11/2020  NORMAL SINUS RHYTHM RIGHT BUNDLE BRANCH BLOCK LEFT ANTERIOR FASCICULAR BLOCK **BIFASCICULAR BLOCK** ABNORMAL ECG WHEN COMPARED WITH ECG OF 06-May-2020 11:15, NO SIGNIFICANT CHANGE WAS FOUND Confirmed by Warnell Forester ([id number]) on 09/11/2020 12:42:12 PM    CT Chest W Contrast    Result Date: 09/11/2020  EXAM: CT CHEST W CONTRAST DATE: 09/11/2020 12:42 AM ACCESSION: [id number] UN DICTATED: 09/11/2020 12:56 AM INTERPRETATION LOCATION: [facility] CLINICAL INDICATION: 58 years old Male with sarcoma, evaluate possible relation of brachial and basilic vein DVT with right IJ port  COMPARISON: 07/31/2020 chest radiograph. Chest CT dated 07/25/2020 and prior TECHNIQUE: A helical CT scan was obtained with IV contrast from the thoracic inlet through the hemidiaphragms. Images were reconstructed in the axial plane.  Coronal and sagittal reformatted images of the chest were also provided for further evaluation of the lung parenchyma. FINDINGS: Right chest Port-A-Cath with catheter tip entering the right internal jugular vein and coursing inferiorly with tip near the superior cavoatrial junction, similar to prior. A bulky upper mediastinal lymph node conglomerate exerts prominent mass effect on the right brachiocephalic vein near the confluence with the left brachiocephalic vein (2:40) with severe luminal narrowing of the vein this point. There is possible invasion of the vein by tumor at this level, with possible tumor thrombus (2:40), though this is uncertain. No evidence of bland SVC thrombus. AIRWAYS, LUNGS, PLEURA: Clear central tracheobronchial tree. Prominent diffuse airway wall thickening. Similar appearance of a 1.0 cm nodular lesion in the left upper lobe with cavitation (2:20), a 1.3 cm medial left lower lobe nodule (2:97), and numerous additional bilateral nodules/micronodules. Interval development of focal regions of groundglass opacification in the bilateral lungs (for example, see 2:53, 2:66) and bilateral nodular consolidative opacities, right greater than left. Small right and trace left pleural effusions with overlying passive atelectasis/nodular consolidation. MEDIASTINUM: Normal heart size.  No pericardial effusion.   Normal caliber thoracic aorta.  Widespread bulky mediastinal internally necrotic conglomerative lymphadenopathy appears grossly similar to 07/25/2020 CT scan. Mild narrowing of the left main pulmonary artery secondary to mass effect from adjacent lymphadenopathy, unchanged. IMAGED ABDOMEN: No acute abnormality. Similar periportal edema and partially visualized bulky necrotic central mesenteric lymphadenopathy. SOFT TISSUES: Unremarkable. BONES: Unremarkable.     Severe luminal narrowing of the right brachiocephalic vein near its confluence with the left brachiocephalic vein by a bulky superior mediastinal lymph node. Question invasion of the vessel with resultant tumor thrombus at this location, though this is uncertain. The vein remains patent. There is no evidence of bland thrombus in the venous system. The right subclavian vein is poorly opacified and not well evaluated on this study, possibly secondary to known venous thrombosis in the right upper extremity. Prominent diffuse airway wall thickening. Interval development of multifocal regions of groundglass opacification and bilateral nodular consolidative opacities likely represent sequelae of multifocal infection/aspiration. Small right and trace left pleural effusions. Widespread bulky, necrotic mediastinal and upper abdominal lymphadenopathy appears grossly similar to prior. Multifocal metastatic foci in the bilateral lungs also appear similar to prior. Additional chronic/incidental findings as above. ==================== ADDENDUM (09/11/2020 6:36 AM): On review, the following additional findings were noted: Multiple enlarged right axillary lymph nodes are similar to 07/25/2020.    PVL Venous Duplex Upper Extremity Right    Result Date: 09/10/2020   Peripheral Vascular Lab     [address]   [place], [place] [address]  PVL VENOUS DUPLEX UPPER EXTREMITY RIGHT Patient Demographics Pt. Name: ABBY DENIS Location: Emergency Department MRN:      [id number]          Sex:      M DOB:      January 03, 1961              Age:      23 years  Study Information Authorizing         [id number] [facility]     Performed Time       09/10/2020 Provider Name       [facility] 5:02:54 PM Ordering Physician  Alfredia Ferguson  Patient Location  [facility] Accession Number    [id number]        Technologist         Eli Phillips Diagnosis:                               Assisting                                          Technologist Ordered Reason For Exam: assess for DVT extension Other Indication: increased RUE swelling despite anticoagulation Risk Factors: Chemotherapy, Cancer (soft tissue sarcoma of bilateral forearms, with presumed axillary nodal and lung metastasis) and DVT (Right brachial vein DVT noted 08/06/20). Anticoagulation: (Apixaban).  Examination Protocol The internal jugular, brachiocephalic, subclavian, and axillary veins are routinely assessed bilaterally. The brachial, basilic, and cephalic veins are assessed on the requested side. Spectral and Color Doppler data is the primary method for evaluating the brachiocephalic and subclavian veins. Venous compression is used to evaluate the internal jugular, axillary, and upper arm veins.  Limitations:  Duplex Findings Right The brachial veins are non compressible appearing spongy with compression, dilated and softly echogenic. The basilic vein is non compressible appearing spongy with compression, dilated and softly echogenic. Technically limited evaluation of the distal axillary vein due to increased vascularity in this region; cannot exclude partial thrombus with certainty at this segment. All other venous findings in the upper extremity are within normal limits. Findings appear essentially unchanged as compared to prior exam of 08/06/20. Left No abnormality of venous architecture is observed in the central veins. All Doppler signals are appropriately pulsatile with ventilatory excursions. Color Doppler notes appropriate filling in all vessels.  Summary of Findings Right Evidence of acute obstruction is visualized in the basilic vein. Evidence of continued obstruction is visualized in the brachial vein. Technically limited evaluation of the distal axillary vein due to increased vascularity in this region; cannot exclude partial thrombus with certainty at this segment. All other venous findings in the upper extremity are within normal limits. Findings appear essentially unchanged as compared to prior exam of 08/06/20. Left No evidence of obstruction was seen in the central veins.  ** Preliminary **    ______________________________________________________________________  Discharge Instructions:     Activity Instructions       Activity as tolerated                   Other Instructions       Discharge instructions      It was a pleasure taking care of you!    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 Determined to be taking only 1/2 dose of Eliquis daily (1x as opposed to BID) which likely contributed to the progression of DVT. Not true failure of Eliquis and for this reason will discharge with loading dose of Eliquis with strict instructions to take 10mg  (2 tabs of 5mg  each) two times a day for 1 week (10/21-10/27) and then transitioning to 5mg  (1 tab of 5mg ) two times a day indefinitely, starting from 09/18/20  New/Important Medications:  - Eliquis - follow the instructions above    Follow up appointments: - Follow up with your  oncology team, Pam Drown FNP, on 09/17/20 at 10:30 AM at [facility] Hematology Oncology 2nd [facility].   - Follow up with your primary oncologist, Dr. Dorna Mai, on 09/23/20 at 12:00 PM at [facility] Oncology  2nd [facility].   --------------    When to Call Your [facility] Cancer Care Team:   Monday- Friday from 8:00 am - 5:00 pm: Call [phone] or Toll free [phone].  Ask to speak to the Triage Nurse  On Nights, Weekends and Holidays: Call [phone]. Ask the operator to page the Oncology Fellow on Call     RED ZONE:  Take action now!  You need to be seen right away. Call 911 or go to your nearest hospital for help.   - Symptoms are at a severe level of discomfort    - Bleeding that will not stop  - Chest Pain    - Hard to breathe    - Fall or passing out    - New Seizure    - Thoughts of hurting yourself or others     YELLOW ZONE: Take action today  This is NOT an all-inclusive list. Pleae call with any new or worsening symptoms.   Call your doctor, nurse or other healthcare provider at [phone]  You can be seen by a provider the same day through our Same Day Acute Care for Patients with Cancer program.   - Symptoms are new or worsening; You are not within your goal range for:    - Pain          - Swelling (leg, arm, abdomen, face, neck)    - Shortness of breath        - Skin rash or skin changes    - Bleeding (nose, urine, stool, wound)    - Wound issues (redness, drainage, re-opened)    - Feeling sick to your stomach and throwing up    - Confusion    - Mouth sores/pain in your mouth or throat     - Vision changes   - Hard stool or very loose stools (increase in ostomy   - Fever >100.4 F, chills     Output)        - Worsening cough with mucus that is green, yellow or bloody   - No urine for 12 hours      - Pain or burning when going to the bathroom    - Feeding tube or other catheter/tube issue     - Home infusion pump issue - call [phone]   - Redness or pain at previous IV or port/catheter site    - Depressed or anxiety     GREEN ZONE: You are in control   Your symptoms are under control. Continue to take your medicine as ordered. Keep all visits to the doctor.   - No increase or worsening symptoms   - Able to take your medicine   - Able to drink and eat     For your safety and best care, please DO NOT use MyChart messages to report red or yellow symptoms.   MyChart messages are only checked during weekday normal business hours and you should receive a Response within 2 business days.   Please use MyChart only for the follows:   - Non-urgent medication refills, scheduling requests or general questions.           Patient Education:     - Wash your hands routinely with soap and water  - Take  your temperature when you have chills or are not feeling well  - Use a soft toothbrush  - Avoid constipation or straining with bowel movements. This may mean you occasionally need to take over-the-counter stool softeners or laxatives.   - Avoid people who have colds or the flu, or are not feeling well.  - Wear a mask when visiting crowded places.  - Maintain a well-balanced diet and eat healthy foods  - Speak with your doctor before having any dental work done  - Do only as much activity as you can tolerate    Other instructions:  - Don't use dental floss if your platelet count is below 50,000. Your doctor or nurse should tell you if this is the case.  - Use any mouthwashes given to you as directed.  - If you can't tolerate regular brushing, use an oral swab (bristle-less) toothbrush, or use salt and baking soda to clean your mouth. Mix 1 teaspoon of salt and 1 teaspoon of baking soda into an 8-ounce glass of warm water. Swish and spit.  - Watch your mouth and tongue for white patches. This is a sign of fungal infection, a common side effect of chemotherapy. Be sure to tell your doctor about these patches. Medication/mouthwashes can be prescribed to help you fight the fungal infection.      COVID-19 is a new challenge, but [facility] is dedicated to providing you and your loved ones with the best possible cancer care and support in the safest way possible during this time. We made two videos about the ways we are working to keep you safe, such as offering the option to visit your care team over the phone or through a video, as well as support services offered for our patients and their caregivers. If you have any questions about your cancer care, please call your care team.     Video #1: Keeping [facility] Cancer Care patients safe during the COVID-19 crisis  [email or web address]     Video #2: Support for cancer patients and their caregivers during the COVID-19 pandemic  [email or web address].[email or web address]     Video #2: Support for cancer patients and their caregivers during the COVID-19 pandemic  [email or web address].SecureGap.uy         It was a pleasure taking care of you!    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 Determined to be taking only 1/2 dose of Eliquis daily (1x as opposed to BID) which likely contributed to the progression of DVT. Not true failure of Eliquis and for this reason will discharge with loading dose of Eliquis with strict instructions to take 10mg  (2 tabs of 5mg  each) two times a day for 1 week (10/21-10/27) and then transitioning to 5mg  (1 tab of 5mg ) two times a day indefinitely, starting from 09/18/20  New/Important Medications:  - Eliquis - follow the instructions above    Follow up appointments: - Follow up with your oncology team, Pam Drown FNP, on 09/17/20 at 10:30 AM at [facility] Hematology Oncology 2nd [facility].   - Follow up with your primary oncologist, Dr. Dorna Mai, on 09/23/20 at 12:00 PM at [facility] Oncology  2nd [facility].   --------------    When to Call Your [facility] Cancer Care Team:   Monday- Friday from 8:00  am - 5:00 pm: Call [phone] or Toll free [phone].  Ask to speak to the Triage Nurse  On Nights, Weekends and Holidays: Call [phone]. Ask the operator to page the Oncology Fellow on Call     RED ZONE:  Take action now!  You need to be seen right away. Call 911 or go to your nearest hospital for help.   - Symptoms are at a severe level of discomfort    - Bleeding that will not stop  - Chest Pain    - Hard to breathe    - Fall or passing out    - New Seizure    - Thoughts of hurting yourself or others     YELLOW ZONE: Take action today  This is NOT an all-inclusive list. Pleae call with any new or worsening symptoms.   Call your doctor, nurse or other healthcare provider at [phone]  You can be seen by a provider the same day through our Same Day Acute Care for Patients with Cancer program.   - Symptoms are new or worsening; You are not within your goal range for:    - Pain          - Swelling (leg, arm, abdomen, face, neck)    - Shortness of breath        - Skin rash or skin changes    - Bleeding (nose, urine, stool, wound)    - Wound issues (redness, drainage, re-opened)    - Feeling sick to your stomach and throwing up    - Confusion    - Mouth sores/pain in your mouth or throat     - Vision changes   - Hard stool or very loose stools (increase in ostomy   - Fever >100.4 F, chills     Output)        - Worsening cough with mucus that is green, yellow or bloody   - No urine for 12 hours      - Pain or burning when going to the bathroom    - Feeding tube or other catheter/tube issue     - Home infusion pump issue - call [phone]   - Redness or pain at previous IV or port/catheter site    - Depressed or anxiety     GREEN ZONE: You are in control   Your symptoms are under control. Continue to take your medicine as ordered. Keep all visits to the doctor.   - No increase or worsening symptoms   - Able to take your medicine   - Able to drink and eat     For your safety and best care, please DO NOT use MyChart messages to report red or yellow symptoms.   MyChart messages are only checked during weekday normal business hours and you should receive a   Response within 2 business days.   Please use MyChart only for the follows:   - Non-urgent medication refills, scheduling requests or general questions.           Patient Education:     - Wash your hands routinely with soap and water  - Take your temperature when you have chills or are not feeling well  - Use a soft toothbrush  - Avoid constipation or straining with bowel movements. This may mean you occasionally need to take over-the-counter stool softeners or laxatives.   - Avoid people who have colds or the flu, or are not feeling well.  - Wear a mask  when visiting crowded places.  - Maintain a well-balanced diet and eat healthy foods  - Speak with your doctor before having any dental work done  - Do only as much activity as you can tolerate    Other instructions:  - Don't use dental floss if your platelet count is below 50,000. Your doctor or nurse should tell you if this is the case.  - Use any mouthwashes given to you as directed.  - If you can't tolerate regular brushing, use an oral swab (bristle-less) toothbrush, or use salt and baking soda to clean your mouth. Mix 1 teaspoon of salt and 1 teaspoon of baking soda into an 8-ounce glass of warm water. Swish and spit.  - Watch your mouth and tongue for white patches. This is a sign of fungal infection, a common side effect of chemotherapy. Be sure to tell your doctor about these patches. Medication/mouthwashes can be prescribed to help you fight the fungal infection.      COVID-19 is a new challenge, but [facility] is dedicated to providing you and your loved ones with the best possible cancer care and support in the safest way possible during this time. We made two videos about the ways we are working to keep you safe, such as offering the option to visit your care team over the phone or through a video, as well as support services offered for our patients and their caregivers. If you have any questions about your cancer care, please call your care team.     Video #1: Keeping [facility] Cancer Care patients safe during the COVID-19 crisis  [email or web address].[email or web address]     Video #2: Support for cancer patients and their caregivers during the COVID-19 pandemic  [email or web address]     Video #2: Support for cancer patients and their caregivers during the COVID-19 pandemic  [email or web address]            Follow Up instructions and Outpatient Referrals       Discharge instructions      It was a pleasure taking care of you!    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 Determined to be taking only 1/2 dose of Eliquis daily (1x as opposed to BID) which likely contributed to the progression of DVT. Not true failure of Eliquis and for this reason will discharge with loading dose of Eliquis with strict instructions to take 10mg  (2 tabs of 5mg  each) two times a day for 1 week (10/21-10/27) and then transitioning to 5mg  (1 tab of 5mg ) two times a day indefinitely, starting from 09/18/20  New/Important Medications:  - Eliquis - follow the instructions above    Follow up appointments: - Follow up with your oncology team, Pam Drown FNP, on 09/17/20 at 10:30 AM at [facility] Hematology Oncology 2nd [facility].   - Follow up with your primary oncologist, Dr. Dorna Mai, on 09/23/20 at 12:00 PM at [facility] Oncology  2nd [facility].   --------------    When to Call Your [facility] Cancer Care Team:   Monday- Friday from 8:00 am - 5:00 pm: Call [phone] or Toll free [phone].  Ask to speak to the Triage Nurse  On Nights, Weekends and Holidays: Call [phone]. Ask the operator to page the Oncology Fellow on Call  RED ZONE:  Take action now!  You need to be seen right away. Call 911 or go to your nearest hospital for help.   - Symptoms are at a severe level of discomfort    - Bleeding that will not stop  - Chest Pain    - Hard to breathe    - Fall or passing out    - New Seizure    - Thoughts of hurting yourself or others     YELLOW ZONE: Take action today  This is NOT an all-inclusive list. Pleae call with any new or worsening symptoms.   Call your doctor, nurse or other healthcare provider at [phone]  You can be seen by a provider the same day through our Same Day Acute Care for Patients with Cancer program.   - Symptoms are new or worsening; You are not within your goal range for:    - Pain          - Swelling (leg, arm, abdomen, face, neck)    - Shortness of breath        - Skin rash or skin changes    - Bleeding (nose, urine, stool, wound)    - Wound issues (redness, drainage, re-opened)    - Feeling sick to your stomach and throwing up    - Confusion    - Mouth sores/pain in your mouth or throat     - Vision changes   - Hard stool or very loose stools (increase in ostomy   - Fever >100.4 F, chills     Output)        - Worsening cough with mucus that is green, yellow or bloody   - No urine for 12 hours      - Pain or burning when going to the bathroom    - Feeding tube or other catheter/tube issue     - Home infusion pump issue - call [phone]   - Redness or pain at previous IV or port/catheter site    - Depressed or anxiety     GREEN ZONE: You are in control   Your symptoms are under control. Continue to take your medicine as ordered. Keep all visits to the doctor.   - No increase or worsening symptoms   - Able to take your medicine   - Able to drink and eat     For your safety and best care, please DO NOT use MyChart messages to report red or yellow symptoms.   MyChart messages are only checked during weekday normal business hours and you should receive a   Response within 2 business days.   Please use MyChart only for the follows:   - Non-urgent medication refills, scheduling requests or general questions.           Patient Education:     - Wash your hands routinely with soap and water  - Take your temperature when you have chills or are not feeling well  - Use a soft toothbrush  - Avoid constipation or straining with bowel movements. This may mean you occasionally need to take over-the-counter stool softeners or laxatives.   - Avoid people who have colds or the flu, or are not feeling well.  - Wear a mask when visiting crowded places.  - Maintain a well-balanced diet and eat healthy foods  - Speak with your doctor before having any dental work done  - Do only as much activity as you can tolerate  Other instructions:  - Don't use dental floss if your platelet count is below 50,000. Your doctor or nurse should tell you if this is the case.  - Use any mouthwashes given to you as directed.  - If you can't tolerate regular brushing, use an oral swab (bristle-less) toothbrush, or use salt and baking soda to clean your mouth. Mix 1 teaspoon of salt and 1 teaspoon of baking soda into an 8-ounce glass of warm water. Swish and spit.  - Watch your mouth and tongue for white patches. This is a sign of fungal infection, a common side effect of chemotherapy. Be sure to tell your doctor about these patches. Medication/mouthwashes can be prescribed to help you fight the fungal infection.      COVID-19 is a new challenge, but [place] and the [facility] is dedicated to providing you and your loved ones with the best possible cancer care and support in the safest way possible during this time. We made two videos about the ways we are working to keep you safe, such as offering the option to visit your care team over the phone or through a video, as well as support services offered for our patients and their caregivers. If you have any questions about your cancer care, please call your care team.     Video #1: Keeping [facility] Cancer Care patients safe during the COVID-19 crisis  [email or web address].eabjmlille.[email or web address]     Video #2: Support for cancer patients and their caregivers during the COVID-19 pandemic  [email or web address]     Video #2: Support for cancer patients and their caregivers during the COVID-19 pandemic  [email or web address].[email or web address]            Appointments which have been scheduled for you      Sep 17, 2020 10:30 AM  (Arrive by 10:00 AM)  RETURN VIDEO - OTHER with Burnis Kingfisher, FNP  [facility] HEMATOLOGY ONCOLOGY 2ND FLR CANCER HOSP [facility] REGION) [address]  [place] [address]  [phone]        Sep 23, 2020  9:00 AM  (Arrive by 8:45 AM)  CT CHEST ABDOMEN PELVIS W CONTRAST with HBR CT RM 1  IMG CT HBR [facility]) [address]  [place] [address]  [phone]   On appt date:  Drink lots of water 24 hrs  Bring recent lab work  Take meds as usual  Civil [employer] fast streamer of current meds  Bring snack if diabetic    On appt date do not:  Consume anything 2 hrs prior to your appointment    Let us know if pt:  Allergic to contrast dyes  Diabetic  Pregnant or nursing  Claustrophobic    (Title:CTWCNTRST)       Sep 23, 2020 11:15 AM  (Arrive by 10:45 AM)  LAB ONLY [place] with ADULT ONC LAB  [facility] ADULT ONCOLOGY LAB DRAW STATION [facility] REGION) [address]  [place] [address]  [phone]        Sep 23, 2020 12:00 PM  (Arrive by 11:30 AM)  RETURN ACTIVE [place] with [name], MD  [facility] ONCOLOGY MULTIDISCIPLINARY 2ND FLR CANCER HOSP [facility] REGION) [address]  [place] [address]  [phone]        Oct 21, 2020  3:30 PM  (Arrive by 3:15 PM)  NEW  DIABETES with Tarri Abernethy, FNP  [facility] [place])  [address]  [place] [address]  [phone]             ______________________________________________________________________  Discharge Day Services:  BP 144/68  - Pulse 76  - Temp 36 ??C (Oral)  - Resp 10  - Ht 185.4 cm (6' 1)  - Wt 69.8 kg (153 lb 14.1 oz)  - SpO2 98%  - BMI 20.30 kg/m??   Pt seen on the day of discharge and determined appropriate for discharge.    Condition at Discharge: stable    Length of Discharge: I spent greater than 30 mins in the discharge of this patient.    ATTENDING ATTESTATION    I saw and evaluated the patient, participating in the key portions of the service, on the day of discharge. I reviewed the resident???s note. I agree with the resident???s findings and plan. I personally spent > 30 minutes in discharge services for this patient on the day of discharge.    Salem Caster MD MPH

## 2020-09-12 LAB — APIXABAN, ANTI-XA: Apixaban:MCnc:Pt:PPP:Qn:Chromo: 45 — ABNORMAL HIGH

## 2020-09-15 MED ORDER — FREESTYLE LITE STRIPS
INTRAMUSCULAR | 3 refills | 0.00000 days | Status: CP
Start: 2020-09-15 — End: 2020-09-15

## 2020-09-15 NOTE — Unmapped (Signed)
[facility] Shared [facility] Clinical Assessment & Refill Coordination Note    Ivan Banks, DOB: 1960-12-22  Phone: [phone] (home)     All above HIPAA information was verified with patient.     Was a Nurse, learning disability used for this call? No    Specialty Medication(s):   Hematology/Oncology: Greggory Keen     Current Outpatient Medications   Medication Sig Dispense Refill   ??? apixaban (ELIQUIS) 5 mg Tab Take 1 tablet (5 mg total) by mouth Two (2) times a day. 180 tablet 3   ??? apixaban (ELIQUIS) 5 mg Tab Take 2 tablets (10mg ) twice a day from 10/21-10/27. Then take 1 tablet (5mg ) twice a day starting 09/18/20  0   ??? blood sugar diagnostic (GLUCOSE BLOOD) Strp Use to check blood sugar as directed with insulin 3 times a day & for symptoms of high or low blood sugar. 100 strip 0   ??? blood-glucose meter kit Use as instructed 1 each 0   ??? blood-glucose meter kit Use as instructed 1 each 0   ??? EPINEPHrine (EPIPEN) 0.3 mg/0.3 mL injection Inject 0.3 mL (0.3 mg total) into the muscle once for 1 dose. 0.3 mL 0   ??? fluoride, sodium, (CLINPRO 5000) 1.1 % Pste Apply to teeth daily. Brush on teeth twice daily, do not rinse, spit out excess leave on teeth for at least one hr before eating or drinking 100 mL 5   ??? FREESTYLE LITE STRIPS Strp Test daily before all meals/snacks and once before bedtime. 3 each 0   ??? gabapentin (NEURONTIN) 100 MG capsule Take 1 capsule (100 mg total) by mouth nightly. X 2 nights and if tolerating, then increase to 3 caps (300 mg) every night 270 capsule 0   ??? insulin glargine (LANTUS SOLOSTAR U-100 INSULIN) 100 unit/mL (3 mL) injection pen Inject 0.15 mL (15 Units total) under the skin daily. 4.5 mL 0   ??? lancets (FREESTYLE) 28 gauge Misc Test daily before all meals/snacks and once before bedtime. 100 each 0   ??? lancets (FREESTYLE) 28 gauge Misc Test daily before all meals/snacks and once before bedtime. 3 each 0   ??? lancets Misc Use to check blood sugar as directed with insulin 3 times a day & for symptoms of high or low blood sugar. 100 each 0   ??? lisinopriL (PRINIVIL,ZESTRIL) 20 MG tablet Take 1 tablet (20 mg total) by mouth daily. 30 tablet 0   ??? metFORMIN (GLUCOPHAGE-XR) 500 MG 24 hr tablet Take 1 tablet (500 mg total) by mouth every morning before breakfast. 90 tablet 3   ??? naloxone (NARCAN) 4 mg nasal spray One spray in either nostril once for known/suspected opioid overdose. May repeat every 2-3 minutes in alternating nostril til EMS arrives 2 each 0   ??? OLANZapine (ZYPREXA) 5 MG tablet Take 1 tablet (5 mg total) by mouth nightly. Take for the first 4 days after chemotherapy 4 tablet 5   ??? ondansetron (ZOFRAN) 8 MG tablet Take 1 tablet (8 mg total) by mouth every eight (8) hours as needed for nausea (or vomiting). 30 tablet 2   ??? oxyCODONE (ROXICODONE) 10 mg immediate release tablet Take 1 tablet (10 mg total) by mouth every eight (8) hours as needed for pain. 90 tablet 0   ??? oxyCODONE myristate (XTAMPZA ER) 18 mg CSpT Take 18 mg by mouth every twelve (12) hours. 60 each 0   ??? pegfilgrastim-cbqv (UDENYCA) 6 mg/0.6 mL injection Inject the contents of 1 syringe (0.32mL)  under the skin once every 21 days. Inject 24-72 hours after last chemotherapy dose on day 1. 0.6 mL 4   ??? pen needle, diabetic 31 gauge x 1/4 (6 mm) Ndle Use for insulin administration daily 100 each 0   ??? polyethylene glycol (MIRALAX) 17 gram/dose powder Take 17 g by mouth two (2) times a day as needed (constipation). 850 g 0   ??? prochlorperazine (COMPAZINE) 10 MG tablet Take 1 tablet (10 mg total) by mouth every six (6) hours as needed (nausea or vomitting). 30 tablet 2   ??? senna (SENOKOT) 8.6 mg tablet Take 1 tablet by mouth two (2) times a day as needed for constipation. 60 tablet 0   ??? silver sulfaDIAZINE (SILVADENE, SSD) 1 % cream Apply to affected area daily 50 g 3     No current facility-administered medications for this visit.        Changes to medications: Sylvanus reports no changes at this time.    Allergies Allergen Reactions   ??? Venom-Honey Bee Anaphylaxis   ??? Aspirin Other (See Comments)     Stomach pain   ??? Morphine Itching       Changes to allergies: No    SPECIALTY MEDICATION ADHERENCE     Udenyca 6 mg/0.6 mL: 0 days of medicine on hand     Medication Adherence    Patient reported X missed doses in the last month: 0  Specialty Medication: Udenyca 6mg /0.6 mL - Inject the contents of 1 syringe (0.4mL) under the skin once every 21 days. Inject 24-72 hours after last chemotherapy dose on day 1.  Patient is on additional specialty medications: No  Informant: patient  Confirmed plan for next specialty medication refill: delivery by pharmacy          Specialty medication(s) dose(s) confirmed: Regimen is correct and unchanged.     Are there any concerns with adherence? No    Adherence counseling provided? Not needed    CLINICAL MANAGEMENT AND INTERVENTION      Clinical Benefit Assessment:    Do you feel the medicine is effective or helping your condition? Yes    Clinical Benefit counseling provided? Not needed    Adverse Effects Assessment:    Are you experiencing any side effects? Yes, patient reports experiencing burning when administered. Side effect counseling provided: Make sure to take out of refrigerator for at least 30 minutes and allow medication to reach room temperature before injecting.    Are you experiencing difficulty administering your medicine? No    Quality of Life Assessment:    How many days over the past month did your Sarcoma  keep you from your normal activities? For example, brushing your teeth or getting up in the morning. 0    Have you discussed this with your provider? Not needed    Therapy Appropriateness:    Is therapy appropriate? Yes, therapy is appropriate and should be continued    DISEASE/MEDICATION-SPECIFIC INFORMATION      For patients on injectable medications: Patient currently has 0 doses left.  Next injection is scheduled for 09/24/20.    PATIENT SPECIFIC NEEDS     - Does the patient have any physical, cognitive, or cultural barriers? No    - Is the patient high risk? No    - Does the patient require a Care Management Plan? No     - Does the patient require physician intervention or other additional services (i.e. nutrition, smoking cessation, social work)? No  SHIPPING     Specialty Medication(s) to be Shipped:   Hematology/Oncology: Greggory Keen    Other medication(s) to be shipped: No additional medications requested for fill at this time     Changes to insurance: No    Delivery Scheduled: Yes, Expected medication delivery date: 09/19/20.     Medication will be delivered via UPS to the confirmed prescription address in [facility].    The patient will receive a drug information handout for each medication shipped and additional FDA Medication Guides as required.  Verified that patient has previously received a Conservation officer, historic buildings.    All of the patient's questions and concerns have been addressed.    Breck Coons Shared [facility] Specialty Pharmacist

## 2020-09-15 NOTE — Unmapped (Signed)
Retuned call to pt.He was calling to see if Dr. Dorna Mai would call in a refill for Accu Chek strips that he needs.He does not want to pay out of pocket($26) and he has not seen internal medicine recently.Will send refill request to Dr. Domingo Madeira first to see if he will fill.

## 2020-09-15 NOTE — Unmapped (Signed)
Patient Requests Medication Refill    ??? Name of medication: blood sugar diagnostic (glucose Blood) strp  ??? Is there a preferred amount requested (e.g. 30 pills, 3 months worth - if no leave blank): 90 days  ??? Desired pharmacy (defaults to patient's preferred pharmacy list - confirm or change):[facility], [place] , [place] [phone]      ??? Best callback number if any questions (defaults to patient's preferred phone - confirm or change): [phone]  ??? PCP: Suanne Marker, MD  ??? Last encounter in department: Visit date not found (If more than a year, offer an appointment.)   patient completely out

## 2020-09-15 NOTE — Unmapped (Signed)
Hi,     Ivan Banks contacted the Communication Center requesting to speak with the care team of Ivan Banks to discuss:    Patient called to speak to Dr. Dorna Mai about refilling diabetic test strips.    Please contact [place] at [phone].        Check Indicates criteria has been reviewed and confirmed with the patient:    [x]  Preferred Name   [x]  DOB and/or MR#  [x]  Preferred Contact Method  [x]  Phone Number(s)   []  MyChart     Thank you,   Jacques Navy  [facility] Cancer Communication Center   [phone]

## 2020-09-16 NOTE — Unmapped (Signed)
Addended by: Erven Colla R on: 09/15/2020 05:04 PM     Modules accepted: Orders

## 2020-09-17 ENCOUNTER — Telehealth: Admit: 2020-09-17 | Discharge: 2020-09-18 | Payer: PRIVATE HEALTH INSURANCE

## 2020-09-17 DIAGNOSIS — G893 Neoplasm related pain (acute) (chronic): Principal | ICD-10-CM

## 2020-09-17 DIAGNOSIS — C499 Malignant neoplasm of connective and soft tissue, unspecified: Principal | ICD-10-CM

## 2020-09-17 DIAGNOSIS — Z515 Encounter for palliative care: Principal | ICD-10-CM

## 2020-09-17 DIAGNOSIS — M79601 Pain in right arm: Principal | ICD-10-CM

## 2020-09-17 NOTE — Unmapped (Cosign Needed)
OUTPATIENT ONCOLOGY PALLIATIVE CARE    Principal Diagnosis: [name] is a 59 y.o. 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     Assessment/Plan:   1.  Left lower back pain and right axilla pain due to sarcomas-appears to have a neuropathic component to his pain.  -Continue Xtampza ER 18 mg twice daily.  -Continue oxycodone 10 mg every 8 hours as needed  -Continue gabapentin 100 mg x 2 nights then if tolerating increase to 300 mg every night.    2. Support- Not able to check blood sugars because he ran out of the diabetic test strips as of this morning.  Blood sugars as of yesterday were roughly 100 a.m. and p.m. Comments that he thinks that the insulin should be taking only if his blood sugars over 200.  Ivan Banks also commenting that there is supposed to be a diabetic support class close to him but he is unsure of this.  -NP shared with patient that his medication profile shares he should be taking the insulin on a daily basis.  Will connect with primary care team for more follow-up support.  -Would benefit from home health support with a RN for med compliance and also social worker support. Ivan Banks shared that he does go to [facility] in [facility] and shares that it is been a source of counseling and food support.     3.  Goals of care.  Verbalizing some frustration over the limited capacity he has with his right arm.  Shared a year ago he was so active in the work that he has done in the past and the following day he was diagnosed with cancer and everything changed so abruptly.  Shared that he does have moments of being short tempered with his girlfriend at times.  Finds the support with the Ivan Banks counseling center is helpful.   -Shared with Ivan Banks that I am unsure if he is ever going to have the full capacity of his right arm like he had in the past.  -Hoping to connect with an MSW in his community to assess for additional support in his community.  If not, will work with the [employer] social work team.  -Encouraged him to do things that he finds enjoyable such as attend [employer] football games.  Has received the Covid vaccine.  -Will connect with Dr. Dorna Banks to discuss possibility of stent per clinic notes and Ivan Banks's comment that he remembers this being a possible treatment option during his stay. Notes indicate: Vascular surgery consult for possible stenting of severe luminal narrowing of the right brachiocephalic vein given patient's right arm pain        Health Care Decision Maker    HCDM, First Alternate: Ivan Banks - Domestic Partner - [phone]    HCDM, Second Alternate: Ivan Banks - Brother - [phone]      # Controlled substances risk management.   - Patient has a signed pain medication agreement with Outpatient Palliative Care, completed on 09/02/20, as per standard of care.   - NCCSRS database was reviewed today and it was appropriate.   - Urine drug screen was not performed at this visit. Findings: not applicable.   - Patient has received information about safe storage and administration of medications.   - Patient has received a prescription for narcan.      F/u: 4 wks during infusion    ----------------------------------------  Referring Provider:  Dr. Dorna Banks  Oncology Team: Dr. 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 Had palliative XRT to L forearm in 11/2019.  07/31/2020 hospitalized and diagnosed with new onset type 1 diabetes mellitus. 07/2020 with Occlusive thrombus seen within the mid right brachial vein. Onc notes on 07/29/20: Recommended discontinuing olaparib and pursuing doxorubicin as second line therapy. 07/25/20 scans showing decrease in size of pulmonary metastasis and worsening nodal and left posterior chest wall metastasis. PERITONEUM/RETROPERITONEUM AND MESENTERY/SOFT TISSUES with numerous metastatic deposits are again noted. Paraesophageal measures 5.1 x 2.8 cm, Left posterior pelvic mass measuring 5.2 x 4.2 cm and left iliac mass measures 6.5 x 6.2 cm.    Primary pain is left lower back that has been occurring in the last 1 and half months.  Describes it as a stabbing sensation with needle sticking in his back.  Pain intensity is 8-10/10.  Followed by his right axilla and describes this as a burning sensation.  Has been taking Xtampza ER 9 mg twice a day, oxycodone 5 mg about 4 tablets/day and ibuprofen about 6 tablets this past week.  Reports that the oxycodone did work when he initially started it but does not feel like it is helping that much now.  Feels that the ibuprofen is beneficial.  Recently started Eliquis for thrombosis in right arm. Also appreciating a decreased range of motion in his right arm and it remained swollen.    Energy improved after receiving the blood transfusion.  Is independent with ADLs except needs help with dressing due to the limited range of motion with his right arm.  Appetite is slowly improving and weight is currently 158 pounds.  At one point he was down to 140 pounds.  Baseline weight is 165 pounds.  No issues with nausea.  Taking olanzapine for days after chemotherapy.  Bowel movements are every 2 to 3 days and using Ivan Banks.    Mood has been good.  Notes indicate alcohol and benzodiazepine use. Ivan Banks did endorse that when he took the benzodiazepine and things were prescription meds given to him.  He is no longer drinking alcohol after having had the radiation which is altered his taste for alcohol.  Does smoke marijuana.      Current cancer-directed therapy: DOXORUBICIN    Interval hx 09/17/20 CK and Ivan Banks    Hospitalized from 10/20 to 10/21 for right arm swelling. ED ordered RUE PVL which showed non-compressible brachial veins, basilic vein, and commented findings were essentially unchanged compared to prior exam on 08/06/20. Follow up CT Chest with contrast was notable for severe luminal narrowing of R brachiocephalic v by a bulky superior mediastinal LN with overall stable dx. Increased eliquis and will be on 10 mg bid.      Does comment that the pain is 12/10 but when he takes the pain medication he does get lots of relief that lasts for approximately 6 hours.  Shared that yesterday he took the exams oxycodone 10 mg 3 times a day and the Xtampza ER 18 mg twice a day.  Did start the gabapentin but only took it for 1 night and appeared to tolerate it.  Lying flat also provides pain relief.  Describes that his skin is tight.  Continues to have the swelling in his right hand, right forearm elbow and  bicep and up into his axilla.    Symptom Review:  General: pretty good  Pain: See interval history  Mobility: Has been resting.  Not taking care of his goats like he had been in the past.  Appetite: Good  Nausea: Not assessed  Bowel function: Every other day and is on metformin   Mood: Describes mood is up-and-down and it time does get irritated.  Comments that he is enjoying sports such as the [employer] and is wondering if he can attend a football game at [facility].          Palliative Performance Scale: 80% - Ambulation: Full / Normal Activity with effort, some evidence of disease / Self-Care:Full / Intake: Normal or reduced / Level of Conscious: Full      Coping/Support Issues: Has good support with his family and friends.  He lives with his girlfriend Waynetta Sandy and his brother Lenard Galloway is very supportive.  He does not have children.  He was in accompanied by his friend to him at the initial visit.  Shares that he has good neighbors and his brother-in-law to help him.    Goals of Care: To beat at the cancer.  Short-term goals of completing his tiny house that he has been working on the last couple years.    Social History:   Name of primary support: Beth and Lenard Galloway  Occupation: Did work as a Engineer, structural and quit in 2014.  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  Hobbies: Enjoys working on his tiny house.  Current residence / distance from [facility]: 1 hour and 20 minutes away.    Advance Care Planning:   HCPOA:  Natural surrogate decision maker: Lenard Galloway his brother and Waynetta Sandy his girlfriend.  Living Will:  ACP note:     Objective     Opioid Risk Tool:     Male  Male    Family history of substance abuse      Alcohol  1  3    Illegal drugs  2  3    Rx drugs  4  4    Personal history of substance abuse      Alcohol  3  3    Illegal drugs  4  4    Rx drugs  5  5    Age between 49???45 years  1  1    History of preadolescent sexual abuse  3  0    Psychological disease      ADD, OCD, bipolar, schizophrenia  2  2    Depression  1  1       Total: 3  (<3 low risk, 4-7 moderate risk, >8 high risk)    Oncology History   Sarcoma (CMS-HCC)   07/10/2019 Initial Diagnosis    Sarcoma (CMS-HCC)     05/14/2020 - 07/20/2020 Chemotherapy    STUDY [id number] IRB# [id number] OLAPARIB (v. 07/31/19)  A Phase 2 Study of PARP Inhibitor Olaparib ([id number]) in IDH1 and IDH2 Mutant Advanced Solid Tumors     08/12/2020 -  Chemotherapy    OP SARCOMA DOXORUBICIN  DOXOrubicin 75 mg/m2 IV on day 1, every 21 days         Patient Active Problem List   Diagnosis   ??? Anemia   ??? Cervical neck pain with evidence of disc disease   ??? Left shoulder pain   ??? Sarcoma (CMS-HCC)   ??? Insomnia due to mental disorder   ???  PTSD (post-traumatic stress disorder)   ??? GAD (generalized anxiety disorder)   ??? Alcohol use disorder, moderate, dependence (CMS-HCC)   ??? Right arm pain   ??? New onset type 1 diabetes mellitus, uncontrolled (CMS-HCC)   ??? Hyperosmolar hyperglycemic state (HHS) (CMS-HCC)   ??? History of sarcoma   ??? Acute deep vein thrombosis (DVT) of brachial vein of right upper extremity (CMS-HCC)   ??? Other chronic pain   ??? Diabetes mellitus (CMS-HCC)   ??? Ankle edema, bilateral       Past Medical History:   Diagnosis Date   ??? Anemia    ??? Anxiety    ??? Cancer (CMS-HCC)    ??? Chronic back pain    ??? Diabetes mellitus (CMS-HCC) 08/08/2020   ??? Hypertension    ??? Substance abuse (CMS-HCC)        Past Surgical History:   Procedure Laterality Date   ??? IR INSERT PORT AGE GREATER THAN 5 YRS  08/12/2020    IR INSERT PORT AGE GREATER THAN 5 YRS 08/12/2020 Jobe Gibbon, MD IMG VIR [facility]   ??? KNEE ARTHROSCOPY Left 1990       Current Outpatient Medications   Medication Sig Dispense Refill   ??? apixaban (ELIQUIS) 5 mg Tab Take 1 tablet (5 mg total) by mouth Two (2) times a day. 180 tablet 3   ??? apixaban (ELIQUIS) 5 mg Tab Take 2 tablets (10mg ) twice a day from 10/21-10/27. Then take 1 tablet (5mg ) twice a day starting 09/18/20  0   ??? blood sugar diagnostic (GLUCOSE BLOOD) Strp Use to check blood sugar as directed with insulin 3 times a day & for symptoms of high or low blood sugar. 100 strip 0   ??? blood-glucose meter kit Use as instructed 1 each 0   ??? blood-glucose meter kit Use as instructed 1 each 0   ??? EPINEPHrine (EPIPEN) 0.3 mg/0.3 mL injection Inject 0.3 mL (0.3 mg total) into the muscle once for 1 dose. 0.3 mL 0   ??? fluoride, sodium, (CLINPRO 5000) 1.1 % Pste Apply to teeth daily. Brush on teeth twice daily, do not rinse, spit out excess leave on teeth for at least one hr before eating or drinking 100 mL 5   ??? gabapentin (NEURONTIN) 100 MG capsule Take 1 capsule (100 mg total) by mouth nightly. X 2 nights and if tolerating, then increase to 3 caps (300 mg) every night 270 capsule 0   ??? insulin glargine (LANTUS SOLOSTAR U-100 INSULIN) 100 unit/mL (3 mL) injection pen Inject 0.15 mL (15 Units total) under the skin daily. 4.5 mL 0   ??? lancets (FREESTYLE) 28 gauge Misc Test daily before all meals/snacks and once before bedtime. 100 each 0   ??? lancets (FREESTYLE) 28 gauge Misc Test daily before all meals/snacks and once before bedtime. 3 each 0   ??? lancets Misc Use to check blood sugar as directed with insulin 3 times a day & for symptoms of high or low blood sugar. 100 each 0   ??? lisinopriL (PRINIVIL,ZESTRIL) 20 MG tablet Take 1 tablet (20 mg total) by mouth daily. 30 tablet 0   ??? metFORMIN (GLUCOPHAGE-XR) 500 MG 24 hr tablet Take 1 tablet (500 mg total) by mouth every morning before breakfast. 90 tablet 3   ??? naloxone (NARCAN) 4 mg nasal spray One spray in either nostril once for known/suspected opioid overdose. May repeat every 2-3 minutes in alternating nostril til EMS arrives 2 each 0   ??? OLANZapine (  ZYPREXA) 5 MG tablet Take 1 tablet (5 mg total) by mouth nightly. Take for the first 4 days after chemotherapy 4 tablet 5   ??? ondansetron (ZOFRAN) 8 MG tablet Take 1 tablet (8 mg total) by mouth every eight (8) hours as needed for nausea (or vomiting). 30 tablet 2   ??? oxyCODONE (ROXICODONE) 10 mg immediate release tablet Take 1 tablet (10 mg total) by mouth every eight (8) hours as needed for pain. 90 tablet 0   ??? oxyCODONE myristate (XTAMPZA ER) 18 mg CSpT Take 18 mg by mouth every twelve (12) hours. 60 each 0   ??? pegfilgrastim-cbqv (UDENYCA) 6 mg/0.6 mL injection Inject the contents of 1 syringe (0.44mL) under the skin once every 21 days. Inject 24-72 hours after last chemotherapy dose on day 1. 0.6 mL 4   ??? pen needle, diabetic 31 gauge x 1/4 (6 mm) Ndle Use for insulin administration daily 100 each 0   ??? polyethylene glycol (MIRALAX) 17 gram/dose powder Take 17 g by mouth two (2) times a day as needed (constipation). 850 g 0   ??? prochlorperazine (COMPAZINE) 10 MG tablet Take 1 tablet (10 mg total) by mouth every six (6) hours as needed (nausea or vomitting). 30 tablet 2   ??? senna (SENOKOT) 8.6 mg tablet Take 1 tablet by mouth two (2) times a day as needed for constipation. 60 tablet 0   ??? silver sulfaDIAZINE (SILVADENE, SSD) 1 % cream Apply to affected area daily 50 g 3     No current facility-administered medications for this visit.       Allergies:   Allergies   Allergen Reactions   ??? Venom-Honey Bee Anaphylaxis   ??? Aspirin Other (See Comments)     Stomach pain   ??? Morphine Itching       Family History:  Cancer-related family history includes Cancer in his mother.  He indicated that his mother is deceased. He indicated that his father is deceased. He indicated that his sister is alive. He indicated that his brother is alive.      REVIEW OF SYSTEMS:  A comprehensive review of 14 systems was negative except for pertinent positives noted in HPI.          Lab Results   Component Value Date    CREATININE 0.70 09/10/2020     Lab Results   Component Value Date    ALKPHOS 96 09/02/2020    BILITOT 0.2 (L) 09/02/2020    PROT 7.2 09/02/2020    ALBUMIN 3.1 (L) 09/02/2020    ALT 18 09/02/2020    AST 33 09/02/2020             Pam Drown, FNP-BC, [facility]  Outpatient Oncology Palliative Care Service  [facility]  [address] [place], [place] [address]  [phone]         I spent 45 minutes on the phone with the patient on the date of service. I spent an additional 20  minutes on pre- and post-visit activities on the date of service.     The patient was physically located in [place] or a state in which I am permitted to provide care. The patient and/or parent/guardian understood that s/he may incur co-pays and cost sharing, and agreed to the telemedicine visit. The visit was reasonable and appropriate under the circumstances given the patient's presentation at the time.    The patient and/or parent/guardian has been advised of the potential risks and limitations of this mode  of treatment (including, but not limited to, the absence of in-person examination) and has agreed to be treated using telemedicine. The patient's/patient's family's questions regarding telemedicine have been answered.     If the visit was completed in an ambulatory setting, the patient and/or parent/guardian has also been advised to contact their provider???s office for worsening conditions, and seek emergency medical treatment and/or call 911 if the patient deems either necessary.

## 2020-09-18 MED ORDER — APIXABAN 5 MG TABLET
ORAL_TABLET | Freq: Two times a day (BID) | ORAL | 3 refills | 30 days | Status: CN
Start: 2020-09-18 — End: 2021-01-16

## 2020-09-18 MED ORDER — BLOOD GLUCOSE TEST STRIPS
ORAL_STRIP | 3 refills | 0.00000 days | Status: CP
Start: 2020-09-18 — End: ?

## 2020-09-18 MED FILL — UDENYCA 6 MG/0.6 ML SUBCUTANEOUS SYRINGE: 21 days supply | Qty: 0.6 | Fill #1

## 2020-09-18 MED FILL — UDENYCA 6 MG/0.6 ML SUBCUTANEOUS SYRINGE: 21 days supply | Qty: 1 | Fill #1 | Status: AC

## 2020-09-19 ENCOUNTER — Institutional Professional Consult (permissible substitution): Admit: 2020-09-19 | Discharge: 2020-09-20 | Payer: PRIVATE HEALTH INSURANCE

## 2020-09-19 MED ORDER — LISINOPRIL 20 MG TABLET
ORAL_TABLET | Freq: Every day | ORAL | 2 refills | 90.00000 days | Status: CP
Start: 2020-09-19 — End: 2021-06-16

## 2020-09-19 MED ORDER — METFORMIN ER 500 MG TABLET,EXTENDED RELEASE 24 HR
ORAL_TABLET | Freq: Every day | ORAL | 3 refills | 45 days | Status: CP
Start: 2020-09-19 — End: 2021-03-18

## 2020-09-19 MED ORDER — BLOOD GLUCOSE TEST STRIPS
ORAL_STRIP | 3 refills | 0.00000 days | Status: CP
Start: 2020-09-19 — End: ?

## 2020-09-19 NOTE — Unmapped (Addendum)
This was a telehealth service where a resident was involved. As the attending physician, I spent 5 minutes in medical discussion with the patient via phone, participating in the key portions of the service.I spent an additional 10 minutes on pre- and post-visit activities which were specific to the patient and included reviewing the patient???s medical records, lab results, imaging results, and other pertinent records. I reviewed the resident's note and I agree with the resident's findings and plan.

## 2020-09-19 NOTE — Unmapped (Signed)
Internal Medicine Phone Visit    This visit is conducted via telephone.    Contact Information  Person Contacted: Patient  Contact Phone number: [phone] (home)   Is there someone else in the room? No.   Patient agreed to a phone visit    Mr. Rask is a 59 y.o. male  participating in a telephone encounter.    Reason for Call  DM f/u    Subjective:  Recent hospitalization for arm pain/swelling. Continues to have swelling. No issues with Eliquis - he is now taking it everyday.     DM: Checks sugars everyday, but ran out of test strips last week. He had to borrow some from his neighbor. BGs run 100-110. A few weeks ago her had one that was 67, but none that low since.   Taking metformin 500mg  once daily in the mornings. No side effects.   Insulin: takes 10u every night at 6p-7p. If he is over 200, he takes 15u, but he has not done this in over a month.     Pain: continues to have pain, but pain is better controlled now that he is seeing palliative care more consistently.     I have reviewed the problem list, medications, and allergies and have updated/reconciled them if needed.    Objective:  General: no apparent distress, conversant, pleasant  Resp: no increased WOB, able to speak in full sentences, no cough or audible wheeze  Psych: No agitation or anxiety. Mood appropriate.   Neuro: Alert and oriented. Able to engage in conversation. Asks and answers questions appropriately. No dysarthria.         Assessment & Plan:  1. Type 2 diabetes mellitus without complication, with long-term current use of insulin (CMS-HCC)  Since BGs have been consistently 90-110 and he did not require insulin in the hospital, plan to stop insulin and increase metformin. Encouraged him to continue checking his sugars and if he sustains >180, will restart insulin at follow up visits. My overall goal is to reduce the number of medications he has to take and reduce number of follow up visits, so stopping insulin will simplify his regimen and schedule. He was agreeable.  - stop insulin  - continue checking sugars tid  - increase metformin to 1000mg  daily --> can increase if tolerating    2. HTN  Blood pressures at home have been 150s systolic. Plan to continue lisinopril 20mg .     Return in about 3 weeks (around 10/10/2020).     Staffed with Dr. Brooke Dare, discussed    I spent 22 minutes on the phone with the patient on the date of service. I spent an additional 5 minutes on pre- and post-visit activities on the date of service.     The patient was physically located in [place] or a state in which I am permitted to provide care. The patient and/or parent/guardian understood that s/he may incur co-pays and cost sharing, and agreed to the telemedicine visit. The visit was reasonable and appropriate under the circumstances given the patient's presentation at the time.    The patient and/or parent/guardian has been advised of the potential risks and limitations of this mode of treatment (including, but not limited to, the absence of in-person examination) and has agreed to be treated using telemedicine. The patient's/patient's family's questions regarding telemedicine have been answered.     If the visit was completed in an ambulatory setting, the patient and/or parent/guardian has also been advised to contact their provider???s  office for worsening conditions, and seek emergency medical treatment and/or call 911 if the patient deems either necessary.      Charlotta Newton, MD PGY-2  [facility] Internal Medicine

## 2020-09-19 NOTE — Unmapped (Addendum)
Today we are stopping the insulin because your blood sugars have been doing well. Keep checking your sugars during the day. Let me know if you have any low blood sugars.     I will increase your metformin in place of the insulin. Take 1000mg  of metformin (two 500mg  pills) each morning.     I will have a phone visit with you in a few weeks and we can talk about changing the metformin dose or going back on insulin. I am confident you will stay off insulin!

## 2020-09-19 NOTE — Unmapped (Signed)
[facility] at [facility]       Type of visit:  telephone    Reason for visit: follow up    Questions / Concerns that need to be addressed:     General Consent to Treat (GCT) for non-epic video visits only: Verbal consent      Diabetes:  ??? Regularly checking blood sugars?: yes, has been out of strips since last weekend  o If yes, when? Complete log for past 7 days  Date Before Breakfast After Breakfast Before Lunch After Lunch Before Dinner After Dinner Before Bed                                                                                                                                     Hypertension:  ??? Have blood pressure cuff at home?: yes- arm cuff  ??? Regularly checking blood pressure?: yes  If yes, complete log for past 7 days  Date Time BP Pulse                                                                                   Allergies reviewed: Yes    Medication reviewed: Yes  Pended refills? Yes        HCDM reviewed and updated in Epic:    We are working to make sure all of our patients??? wishes are updated in Epic and part of that is documenting a Environmental health practitioner for each patient  A Health Care Decision Rodena Piety is someone you choose who can make health care decisions for you if you are not able ??? who would you most want to do this for you????  is already up to date.        BPAs completed:        COVID-19 Vaccine Summary  Type:  Pfizer  Dates Given:  03/10/2020  Due Date for next dose:   03/31/2020                  If no: Are you interested in scheduling?     Immunization History   Administered Date(s) Administered   ??? COVID-19 VACC,MRNA,(PFIZER)(PF)(IM) 03/10/2020   ??? TdaP 12/26/2017       __________________________________________________________________________________________    SCREENINGS COMPLETED IN FLOWSHEETS    HARK Screening       AUDIT       PHQ2       PHQ9          P4 Suicidality Screener                GAD7  COPD Assessment       Falls Risk .imcres

## 2020-09-23 ENCOUNTER — Ambulatory Visit: Admit: 2020-09-23 | Discharge: 2020-09-24 | Payer: PRIVATE HEALTH INSURANCE

## 2020-09-23 ENCOUNTER — Ambulatory Visit
Admit: 2020-09-23 | Discharge: 2020-09-24 | Payer: PRIVATE HEALTH INSURANCE | Attending: Student in an Organized Health Care Education/Training Program | Primary: Student in an Organized Health Care Education/Training Program

## 2020-09-23 ENCOUNTER — Other Ambulatory Visit: Admit: 2020-09-23 | Discharge: 2020-09-24 | Payer: PRIVATE HEALTH INSURANCE

## 2020-09-23 DIAGNOSIS — R918 Other nonspecific abnormal finding of lung field: Principal | ICD-10-CM

## 2020-09-23 DIAGNOSIS — C499 Malignant neoplasm of connective and soft tissue, unspecified: Principal | ICD-10-CM

## 2020-09-23 DIAGNOSIS — R590 Localized enlarged lymph nodes: Principal | ICD-10-CM

## 2020-09-23 DIAGNOSIS — Z85831 Personal history of malignant neoplasm of soft tissue: Principal | ICD-10-CM

## 2020-09-23 LAB — CBC W/ AUTO DIFF
BASOPHILS ABSOLUTE COUNT: 0.1 10*9/L (ref 0.0–0.1)
BASOPHILS RELATIVE PERCENT: 0.8 %
EOSINOPHILS ABSOLUTE COUNT: 0.1 10*9/L (ref 0.0–0.4)
EOSINOPHILS RELATIVE PERCENT: 0.6 %
HEMATOCRIT: 24.6 % — ABNORMAL LOW (ref 41.0–53.0)
HEMOGLOBIN: 8.2 g/dL — ABNORMAL LOW (ref 13.5–17.5)
LARGE UNSTAINED CELLS: 1 % (ref 0–4)
MEAN CORPUSCULAR HEMOGLOBIN CONC: 33.1 g/dL (ref 31.0–37.0)
MEAN CORPUSCULAR HEMOGLOBIN: 36 pg — ABNORMAL HIGH (ref 26.0–34.0)
MEAN CORPUSCULAR VOLUME: 108.6 fL — ABNORMAL HIGH (ref 80.0–100.0)
MEAN PLATELET VOLUME: 9.5 fL (ref 7.0–10.0)
MONOCYTES ABSOLUTE COUNT: 0.6 10*9/L (ref 0.2–0.8)
MONOCYTES RELATIVE PERCENT: 4.9 %
NEUTROPHILS ABSOLUTE COUNT: 10.6 10*9/L — ABNORMAL HIGH (ref 2.0–7.5)
NEUTROPHILS RELATIVE PERCENT: 84.5 %
PLATELET COUNT: 396 10*9/L (ref 150–440)
RED BLOOD CELL COUNT: 2.27 10*12/L — ABNORMAL LOW (ref 4.50–5.90)
RED CELL DISTRIBUTION WIDTH: 17.7 % — ABNORMAL HIGH (ref 12.0–15.0)
WBC ADJUSTED: 12.5 10*9/L — ABNORMAL HIGH (ref 4.5–11.0)

## 2020-09-23 LAB — COMPREHENSIVE METABOLIC PANEL
ALBUMIN: 3.2 g/dL — ABNORMAL LOW (ref 3.4–5.0)
ALKALINE PHOSPHATASE: 61 U/L (ref 46–116)
ALT (SGPT): <7 U/L — ABNORMAL LOW (ref 10–49)
ANION GAP: 8 mmol/L (ref 5–14)
AST (SGOT): 15 U/L (ref <=34)
BILIRUBIN TOTAL: 0.2 mg/dL — ABNORMAL LOW (ref 0.3–1.2)
BLOOD UREA NITROGEN: 15 mg/dL (ref 9–23)
BUN / CREAT RATIO: 21
CALCIUM: 9.2 mg/dL (ref 8.7–10.4)
CHLORIDE: 103 mmol/L (ref 98–107)
CO2: 25 mmol/L (ref 20.0–31.0)
CREATININE: 0.73 mg/dL
EGFR CKD-EPI AA MALE: >90 mL/min/{1.73_m2} (ref >=60)
EGFR CKD-EPI NON-AA MALE: >90 mL/min/{1.73_m2} (ref >=60)
POTASSIUM: 3.7 mmol/L (ref 3.4–4.5)
PROTEIN TOTAL: 7.5 g/dL (ref 5.7–8.2)
SODIUM: 136 mmol/L (ref 135–145)

## 2020-09-23 LAB — POLYCHROMASIA

## 2020-09-23 LAB — SLIDE REVIEW

## 2020-09-23 LAB — CREATININE: Creatinine:MCnc:Pt:Ser/Plas:Qn:: 0.73

## 2020-09-23 LAB — NEUTROPHILS RELATIVE PERCENT: Neutrophils/100 leukocytes:NFr:Pt:Bld:Qn:Automated count: 84.5

## 2020-09-23 MED ADMIN — iohexoL (OMNIPAQUE) 350 mg iodine/mL solution 100 mL: 100 mL | INTRAVENOUS | @ 13:00:00 | Stop: 2020-09-23

## 2020-09-23 MED ADMIN — heparin, porcine (PF) 100 unit/mL injection 500 Units: 500 [IU] | INTRAVENOUS | @ 21:00:00 | Stop: 2020-09-24

## 2020-09-23 MED ADMIN — sodium chloride (NS) 0.9 % infusion: 100 mL/h | INTRAVENOUS | @ 20:00:00 | Stop: 2020-09-24

## 2020-09-23 MED ADMIN — dexAMETHasone (DECADRON) tablet 12 mg: 12 mg | ORAL | @ 20:00:00 | Stop: 2020-09-23

## 2020-09-23 MED ADMIN — fosaprepitant (EMEND) 150 mg in sodium chloride (NS) 0.9 % 100 mL IVPB: 150 mg | INTRAVENOUS | @ 20:00:00 | Stop: 2020-09-23

## 2020-09-23 MED ADMIN — ondansetron (ZOFRAN) tablet 16 mg: 16 mg | ORAL | @ 20:00:00 | Stop: 2020-09-23

## 2020-09-23 MED ADMIN — OLANZapine (ZYPREXA) tablet 5 mg: 5 mg | ORAL | @ 20:00:00 | Stop: 2020-09-23

## 2020-09-23 MED ADMIN — DOXOrubicin (ADRIAMYCIN) syringe: 75 mg/m2 | INTRAVENOUS | @ 21:00:00 | Stop: 2020-09-23

## 2020-09-23 NOTE — Unmapped (Signed)
Swelling in RFA from elbow to hand. Pt reports approx 8wks. Cool to touch. C/o pain.

## 2020-09-23 NOTE — Unmapped (Unsigned)
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  I reviewed the [facility] Sarcoma Targeted Gene Fusion Panel results for this patient, which did not note fusions in NR4A3 (extraskeletal myxoid chondrosarcoma) or HEY1-NCOA2 (mesenchymal chondrosarcoma).  Patient enrolled on  [id number] olaparib trial for his IDH-mutated disease but taken off-study after 2 cycles of olaparib due to mixed response / progression on imaging.  Now on doxorubicin as second-line therapy.    Today seems he is tolerating therapy well overall.  Few chemotherapy related side effects and his fatigue has been manageable.  We discussed that the abdominal progression noted on scans is likely due to interval growth between his pretreatment scans and start of doxorubicin (about 3 weeks).  He's tolerating well and is agreeable to continue this therapy.    His arm swelling continues to be an issue, previously was taking half-dose of apixaban which was noted during his recent admission for arm swelling.  Despite resumption of anticoagulation at appropriate doses and stable PVL findings his swelling has not improved.  We discussed referral to vascular surgery for evaluation.        - RTC 3 weeks with labs, scans for consideration of doxorubicin C3  - Vascular surgery consultation for persistent RUE swelling on anticoagulation occlusive thrombus in right mid brachial vein.  Patient reports having an IV at the site of the swelling that infiltrated during hospitalization for elevated glucose levels, and that pain and swelling (both at immediate IV site as well as distal arm) started while IV was in place.  Risk factors are clearly active malignancy.  We discussed this may be catheter-related and, while evidence supports continuing anticoagulation long-term for lower extremity DVTs given pro-thrombotic malignancy, we could reconsider this in the future depending on the clinical circumstances (e.g. how well he tolerates therapy, cost, tumor burden).  I recommend continuing this for the next 3 months and re-evaluating.  He is currently on apixaban and within the 7 day lead-in period after starting drug.  - Continue apixabin    I personally reviewed the medical records, pathology and laboratory results and viewed the imaging. All questions were answered to the patient's satisfaction and they voiced understanding and agreement with the plan. Pt has my card and contact information and is encouraged to reach out with any further questions.      REASON FOR CONSULTATION:   Ivan Banks is a 59 y.o. male who is seen in consultation at the request of Self, Referred for evaluation of his soft tissue sarcoma.      HISTORY OF PRESENT ILLNESS:      Since his last visit he was briefly hospitalized with blood glucose levels in the 600-800 range, quickly recovered with IV insulin and fluids.  He developed swelling in his right arm after an IV infiltrated, and this swelling persisted for several days.  PVL doppler studies demonstrated mid-brachial vein occlusive thrombus on the right.  Today reports his arm is largely stable, not much improved since starting apixban a few days prior.  Using heat on arm which helps throbbing pain.  Denies cough, dyspnea, or new aches or pains.  He is here today with a friend / neighbor.      Oncologic timeline:  - 2015-2016: Patient first notes small nodule in middle of RIGHT anterior forearm, nonpainful  - 06/12/19: Meets with local general surgeon, CT of RUE ordered  - 06/15/19: CT Chest w contrast: Right axillary lymphadenopathy largest 3.7cm, multiple solid/subsolid  Pulmonary nodules bilaterally with largest 8.21mm over right lower lobe.  - 06/25/19: US guided biopsy of R forearm and axillary masses.  Pathology revealed a epitheloid / undifferentiated malignancy suspicious for an undifferentiated sarcoma, IHC (+) Vimentin, ema, CD99 (-) CK7, CK20, OSCAR, S100, HMB-45, Desmin, smooth muscle actin, SOX-10, CEA, Calponin, cytokeratin, CD68, WT1.  In addition, there was equivocal or focal staining for p63.  Staining for INI1 showed nuclear localization (normal result).   - Sept 2020: palliative RT to the right forearm (55Gy/102fx) and R axilla (48Gy/37fx) finished 08/22/2019  - 08/23/2019: STRATA molecular testing: IDH1 pR132C, MS:S, TMB 68mut/Mb (low), also detected variants of uncertain significance: MLL3 M1242K (53% VAF), TAF1L K914E (5% VAF), TPR Q1063* (9% VAF)  - 10/30/19: MRI LEFT forearm: 6.5 x 3.1 x 3.8 cm mass in left flexor compartment, likely focus of metastatic disease  - 12/19/19: Completes palliative XRT to LEFT forearm lesion, 5500cGy with Dr Rayetta Humphrey  - 01/01/20: CT Chest wo contrast: Decrease in right axillary lymphadenopathy, two new right lower lobe and left upper lobe 0.7 cm pulmonary nodules, increased size of left paratracheal node, and increased size of right subpectoral node compatible with metastatic disease  - 04/01/20: CT chest wo contrast: Interval increased size of pulmonary metastasis and multi compartmental mediastinal and right axillary nodal metastasis consistent with disease progression.  Interval prominence of upper abdominal lymphadenopathy, concerning for metastatic disease. Recommend CT abdomen and pelvis for better characterization.  - 05/06/20: CT Chest wo contrast: Increased burden of metastatic disease including increased size of multiple pulmonary nodules, axillary, retrocrural, mediastinal and gastrohepatic lymphadenopathy. Additionally there are peritoneal masses concerning for metastatic disease.  - 05/14/20: C1D1 olaparib per [id number] (300mg  po BID)  -06/10/20: Interruption in start of C2 due to anemia (14.2 -> 8.3) with C1 olaparib, recheck H&H in 2 weeks  - 06/24/20: Start of C2 olaparib, DR to 250mg  BID  - 07/25/20: CT Chest/Abdomen/Pelvis w contrast: Mixed response, mostly progression, with decrease size of pulmonary nodules and increase in mediastinal, abdominal and pelvic nodal and soft tissue implants.  - 08/12/20: C1 doxorubicin 75mg /m2, tolerated well  - ***  - 09/11/20: CT Chest w contrast: ***  - 09/23/20: CT C/A/P w contrast: Mixed response with stability in the thorax and some abdominal lesions, with mild progression in several abdominal lesions.      REVIEW OF SYSTEMS:  A comprehensive review of 12 systems was negative except for pertinent positives noted in HPI.    Past Medical History:   Diagnosis Date   ??? Anemia    ??? Anxiety    ??? Cancer (CMS-HCC)    ??? Chronic back pain    ??? Diabetes mellitus (CMS-HCC) 08/08/2020   ??? Hypertension    ??? Substance abuse (CMS-HCC)    - Pancreatitis 2009  - Last colonoscopy 2009       Past Surgical History:  Procedure Laterality Date   ??? IR INSERT PORT AGE GREATER THAN 5 YRS  08/12/2020    IR INSERT PORT AGE GREATER THAN 5 YRS 08/12/2020 Jobe Gibbon, MD [facility]   ??? KNEE ARTHROSCOPY Left 1990        Family History   Problem Relation Age of Onset   ??? Cancer Mother    ??? Diabetes Father    ??? Kidney disease Father    ??? Diabetes Sister    ??? Lupus Sister    ??? No Known Problems Brother         Social History     Occupational History   ??? Occupation: Boilermaker   ??? Occupation: Nurse, adult   ??? Occupation: Disability   Tobacco Use   ??? Smoking status: Never Smoker   ??? Smokeless tobacco: Never Used   Vaping Use   ??? Vaping Use: Never used Substance and Sexual Activity   ??? Alcohol use: Not Currently   ??? Drug use: Yes     Frequency: 21.0 times per week     Types: Marijuana, Benzodiazepines     Comment: Smokes 2-3x a day   ??? Sexual activity: Yes   - Works on a horse farm  - Routine cannabis use      ALLERGIES/MEDICATIONS:  Reviewed in EPIC    PHYSICAL EXAM:   Vitals: Pulse 85  - Ht 185.4 cm (6' 1)  - Wt 71.6 kg (157 lb 12.8 oz)  - BMI 20.82 kg/m??   Gen: Comfortable, NAD. ECOG PS 1  Eyes: EOMI, sclera anicteric  ENT:  MMM.   Neck: Supple, trachea midline  Lymph: No cervical, submandibular, or supraclavicular lymphadenopathy.  There is a palpable firm fixed nodule in the deep right axilla, mildly tender today  CV: RRR no murmurs   Lungs: CTAB w/o rales, rhochi, or wheezing, good respiratory effort   GI: +BS, Soft, NT, ND, no masses appreciated   Ext: Warm and well perfused. No cyanosis, clubbing, or edema bilaterally   Skin: No rashes. 2x5cm darkened macula on right forearm from radiation treatment, left forearm just distal to elbow an area of darkened skin.  No discharge, erythema or pain around the site.  There again a palpable 2cm nodule attached to the lateral portion of the right pectoralis muscle, this is mildly tender to palpation and without overlying erythema or skin changes, feels more firm that two weeks prior.  Neuro: Moving all extremities, speech fluid.  Psych: mood euthymic and affect appropriate   MSK: No focal bony deformaties or tenderness    +++++++++++++++++++++++++++++++++++++++++++++++++++++++++++    PATHOLOGY:   Pathology was personally reviewed as described in the HPI, detailed in EPIC.  06/25/2019 Biopsy:  Final Diagnosis   A: Axilla, right, biopsy  - Metastatic, epithelioid/undifferentiated malignancy.  - See diagnostic comment.  ??  B: Forearm, right, biopsy  - Malignant epithelioid malignancy.  - See diagnostic comment.  ??  This electronic signature is attestation that the pathologist personally reviewed the submitted material(s) and the final diagnosis reflects that evaluation.   Electronically signed by Meyer Cory, MD on 07/04/2019 at 1511   Comment    Immunohistochemical staining was performed on this material with the following results: (+) Vimentin, ema, CD99 (-) CK7, CK20, OSCAR, S100, HMB-45, Desmin, smooth muscle actin, SOX-10, CEA, Calponin, cytokeratin, CD68, WT1.  In addition, there was equivocal or focal staining for p63.  Staining for INI1 showed nuclear localization (normal result).  ??  This material  was shown in consultation to Dr. Eyvonne Left and Dr. Linton Rump of Dermatopathology.  ??  Overall, this represents a epithelioid malignancy, most likely a sarcoma, of unknown differentiation.  Material will be sent for detection of gene fusions in an effort to further classify.  A supplemental report will follow.     Addendum   Results from the Sarcoma Targeted Gene Fusion Panel (SARCP) performed at [facility]:  ??  No reportable fusions identified in 138 targeted genes.         LABS:  Reviewed in EPIC      RADIOLOGY:  Imaging was personally viewed as summarized in the history (see above).  I agree with the findings/interpretation; details in EPIC.

## 2020-09-23 NOTE — Unmapped (Addendum)
PLAN FROM TODAY:    ?? We discussed continuing your chemotherapy, based on your scans today which show most spots are stable and some have grown a little since starting chemotherapy  ?? Come back and see [place] in clinic in 3 weeks for your next cycle of chemotherapy      ?? Support for cancer patients and their caregivers during the COVID-19 pandemic [email or web address]  ?? Keeping cancer patients safe during the COVID-19 crisis [email or web address]://[email or web address]--[email or web address]-care-[email or web address]-during-the-covid-19-crisis/  +++++++++++++++++++++++++++++++++++++++++++++++++++++++++++  Your nurse navigator is:  Roseanne Reno, RN, BSN, OCN   Head & Neck/Sarcoma  Oncology Nurse Navigator  [email or web address]    For appointments & questions Monday through Friday 8 AM??? 5 PM   please call [phone] or Toll free [phone].    On Nights, Weekends and Holidays  Call [phone] and ask for the adult hematologist-oncologist on call.    [facility]  [address]  [place], [place] [address]  [email or web address]  Tel: [phone] / Toll Free [phone]  Fax: [phone]  +++++++++++++++++++++++++++++++++++++++++++++++++++++++++++

## 2020-09-23 NOTE — Unmapped (Signed)
Patient arrived to chair 53.  No complaints noted.  Access of port intact with blood return. Pre-medicated per treatment plan orders.  Pt tolerated infusions without difficulty. Port checked for blood return, hep flushed, and removed. AVS printed. Patient left infusion center, NAD.

## 2020-09-23 NOTE — Unmapped (Signed)
text patient. need to schedule follow up from 09/19/20 appt.    Follow-up disposition: Return in about 3 weeks (around 10/10/2020).   Check out comments: Phone visit DR essig only 11/19     Ivan Banks  09/23/20

## 2020-09-24 NOTE — Unmapped (Signed)
Chemo given per orders and tolerated well.

## 2020-09-24 NOTE — Unmapped (Signed)
Lab on 09/23/2020   Component Date Value Ref Range Status   ??? Sodium 09/23/2020 136  135 - 145 mmol/L Final   ??? Potassium 09/23/2020 3.7  3.4 - 4.5 mmol/L Final   ??? Chloride 09/23/2020 103  98 - 107 mmol/L Final   ??? Anion Gap 09/23/2020 8  5 - 14 mmol/L Final   ??? CO2 09/23/2020 25.0  20.0 - 31.0 mmol/L Final   ??? BUN 09/23/2020 15  9 - 23 mg/dL Final   ??? Creatinine 09/23/2020 0.73  0.70 - 1.30 mg/dL Final   ??? BUN/Creatinine Ratio 09/23/2020 21   Final   ??? EGFR CKD-EPI Non-African American,* 09/23/2020 >90  >=60 mL/min/1.27m2 Final   ??? EGFR CKD-EPI African American, Male 09/23/2020 >90  >=60 mL/min/1.3m2 Final   ??? Glucose 09/23/2020 157  70 - 179 mg/dL Final   ??? Calcium 16/08/9603 9.2  8.7 - 10.4 mg/dL Final   ??? Albumin 54/07/8118 3.2* 3.4 - 5.0 g/dL Final   ??? Total Protein 09/23/2020 7.5  5.7 - 8.2 g/dL Final   ??? Total Bilirubin 09/23/2020 0.2* 0.3 - 1.2 mg/dL Final   ??? AST 14/78/2956 15  <=34 U/L Final   ??? ALT 09/23/2020 <7* 10 - 49 U/L Final   ??? Alkaline Phosphatase 09/23/2020 61  46 - 116 U/L Final   ??? WBC 09/23/2020 12.5* 4.5 - 11.0 10*9/L Final   ??? RBC 09/23/2020 2.27* 4.50 - 5.90 10*12/L Final   ??? HGB 09/23/2020 8.2* 13.5 - 17.5 g/dL Final   ??? HCT 21/30/8657 24.6* 41.0 - 53.0 % Final   ??? MCV 09/23/2020 108.6* 80.0 - 100.0 fL Final   ??? MCH 09/23/2020 36.0* 26.0 - 34.0 pg Final   ??? MCHC 09/23/2020 33.1  31.0 - 37.0 g/dL Final   ??? RDW 84/69/6295 17.7* 12.0 - 15.0 % Final   ??? MPV 09/23/2020 9.5  7.0 - 10.0 fL Final   ??? Platelet 09/23/2020 396  150 - 440 10*9/L Final   ??? Neutrophils % 09/23/2020 84.5  % Final   ??? Lymphocytes % 09/23/2020 8.2  % Final   ??? Monocytes % 09/23/2020 4.9  % Final   ??? Eosinophils % 09/23/2020 0.6  % Final   ??? Basophils % 09/23/2020 0.8  % Final   ??? Absolute Neutrophils 09/23/2020 10.6* 2.0 - 7.5 10*9/L Final   ??? Absolute Lymphocytes 09/23/2020 1.0* 1.5 - 5.0 10*9/L Final   ??? Absolute Monocytes 09/23/2020 0.6  0.2 - 0.8 10*9/L Final   ??? Absolute Eosinophils 09/23/2020 0.1  0.0 - 0.4 10*9/L Final   ??? Absolute Basophils 09/23/2020 0.1  0.0 - 0.1 10*9/L Final   ??? Large Unstained Cells 09/23/2020 1  0 - 4 % Final   ??? Macrocytosis 09/23/2020 Marked* Not Present Final   ??? Anisocytosis 09/23/2020 Slight* Not Present Final   ??? Hypochromasia 09/23/2020 Moderate* Not Present Final   ??? Smear Review Comments 09/23/2020 See Comment* Undefined Final    Slide reviewed.   ??? Polychromasia 09/23/2020 Slight* Not Present Final

## 2020-09-29 DIAGNOSIS — C499 Malignant neoplasm of connective and soft tissue, unspecified: Principal | ICD-10-CM

## 2020-09-29 DIAGNOSIS — G893 Neoplasm related pain (acute) (chronic): Principal | ICD-10-CM

## 2020-09-29 MED ORDER — OXYCODONE 10 MG TABLET
ORAL_TABLET | 0 refills | 0 days | Status: CP
Start: 2020-09-29 — End: 2020-10-14

## 2020-09-29 MED ORDER — XTAMPZA ER 18 MG CAPSULE SPRINKLE
Freq: Two times a day (BID) | ORAL | 0 refills | 30.00000 days | Status: CP
Start: 2020-09-29 — End: 2020-10-14

## 2020-10-01 NOTE — Unmapped (Signed)
Hi,     [name] contacted the Communication Center requesting to speak with the care team of Ivan Banks to discuss:    Patient calling regarding referral for VIR to address clotting in arms. No referral in Active Referral tab. Patient refers he has been struggling with clotting for 2 weeks.    Please contact [name] at [phone].    Thank you,   Kelli Hope  [facility] Communication Center   [phone]

## 2020-10-02 NOTE — Unmapped (Signed)
Returned  call to pt.States he saw Dr. Dorna Mai on 09-23-20 and pt was going to be referred to someone(?VIR) for eval of right arm that continues to swell.There is no referral noted in chart or note. Let him know I will reach out to team for clarification and let him know.

## 2020-10-02 NOTE — Unmapped (Signed)
[facility] Refill Coordination Note    Specialty Medication(s) to be Shipped:   Hematology/Oncology: Ivan Banks    Other medication(s) to be shipped: No additional medications requested for fill at this time     Ivan Banks, DOB: [birth year]  Phone: [phone] (home)       All above HIPAA information was verified with patient.     Was a Nurse, learning disability used for this call? No    Completed refill call assessment today to schedule patient's medication shipment from the [facility] [phone]).       Specialty medication(s) and dose(s) confirmed: Regimen is correct and unchanged.   Changes to medications: [name] reports no changes at this time.  Changes to insurance: No  Questions for the pharmacist: No    Confirmed patient received Welcome Packet with first shipment. The patient will receive a drug information handout for each medication shipped and additional FDA Medication Guides as required.       DISEASE/MEDICATION-SPECIFIC INFORMATION        For patients on injectable medications: Patient currently has 0 doses left.  Next injection is scheduled for 10/14/20.    SPECIALTY MEDICATION ADHERENCE     Medication Adherence    Patient reported X missed doses in the last month: 0  Specialty Medication: Udenyca 6mg /0.28mL  Patient is on additional specialty medications: No  Informant: patient                Udenyca 6mg /0.55mL: 0 days of medicine on hand         SHIPPING     Shipping address confirmed in Epic.     Delivery Scheduled: Yes, Expected medication delivery date: 10/10/20.     Medication will be delivered via UPS to the prescription address in Epic [place].    Wyatt Mage M Elisabeth Cara   [facility] Specialty Technician

## 2020-10-03 NOTE — Unmapped (Signed)
Called pt to let him know a message has been sent to Dr. Dorna Mai concerning VIR referral. Will let him know when Dr. Dorna Mai responds.

## 2020-10-03 NOTE — Unmapped (Signed)
Hi,     Ivan Banks contacted the Communication Center regarding the following:    - Patient is calling to check the status of a referral from Dr. Dorna Mai to VIR for swelling of the arm. Still no referral under active referral tab and patient stated that he has not received a call back from care team since calling on 10/01/20. Informed patient that his message would be passed along to nurse triage for a return call.    Please contact Thorin Seide at [phone].    Thanks in advance,    Jannette Spanner  [facility] Communication Center   [phone]

## 2020-10-07 ENCOUNTER — Ambulatory Visit
Admit: 2020-10-07 | Discharge: 2020-10-07 | Disposition: A | Discharge status: Home / Self Care | Payer: PRIVATE HEALTH INSURANCE | Attending: Family

## 2020-10-07 DIAGNOSIS — Z885 Allergy status to narcotic agent status: Principal | ICD-10-CM

## 2020-10-07 DIAGNOSIS — F17200 Nicotine dependence, unspecified, uncomplicated: Principal | ICD-10-CM

## 2020-10-07 DIAGNOSIS — Z79891 Long term (current) use of opiate analgesic: Principal | ICD-10-CM

## 2020-10-07 DIAGNOSIS — Z886 Allergy status to analgesic agent status: Principal | ICD-10-CM

## 2020-10-07 DIAGNOSIS — M549 Dorsalgia, unspecified: Principal | ICD-10-CM

## 2020-10-07 DIAGNOSIS — E119 Type 2 diabetes mellitus without complications: Principal | ICD-10-CM

## 2020-10-07 DIAGNOSIS — M7989 Other specified soft tissue disorders: Principal | ICD-10-CM

## 2020-10-07 DIAGNOSIS — C7801 Secondary malignant neoplasm of right lung: Principal | ICD-10-CM

## 2020-10-07 DIAGNOSIS — Z7901 Long term (current) use of anticoagulants: Principal | ICD-10-CM

## 2020-10-07 DIAGNOSIS — C7802 Secondary malignant neoplasm of left lung: Principal | ICD-10-CM

## 2020-10-07 DIAGNOSIS — F419 Anxiety disorder, unspecified: Principal | ICD-10-CM

## 2020-10-07 DIAGNOSIS — I82721 Chronic embolism and thrombosis of deep veins of right upper extremity: Principal | ICD-10-CM

## 2020-10-07 DIAGNOSIS — C499 Malignant neoplasm of connective and soft tissue, unspecified: Principal | ICD-10-CM

## 2020-10-07 DIAGNOSIS — Z91030 Bee allergy status: Principal | ICD-10-CM

## 2020-10-07 DIAGNOSIS — Z794 Long term (current) use of insulin: Principal | ICD-10-CM

## 2020-10-07 DIAGNOSIS — Z7984 Long term (current) use of oral hypoglycemic drugs: Principal | ICD-10-CM

## 2020-10-07 DIAGNOSIS — G8929 Other chronic pain: Principal | ICD-10-CM

## 2020-10-07 DIAGNOSIS — I10 Essential (primary) hypertension: Principal | ICD-10-CM

## 2020-10-07 NOTE — Unmapped (Signed)
Discharge reviewed with pt and friend. All questions answered. Pt leaving ambulatory.

## 2020-10-07 NOTE — Unmapped (Signed)
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 No acute changes.  Thought he was supposed to see a specialist, but hasn't heard anything.      Exam generally benign.  Moderate edema noted to R hand, forearm and upper arm, with good radial pulse, good grip strength, sensation intact throughout.  Compartments soft, no erythema or signs of infection.    No acute concerns today, though will try to contact patient's primary oncologist to clear up any confusion regarding next steps for patient's symptoms.    11:59 AM  Discussed with Dr. Dorna Mai, primary oncologist, who states plan was to trial anticoagulation for a few weeks at optimum dose to see if any symptom improvement.  He has sent a referral to vascular surgery for further evaluation and management.  I have discussed this plan of care with the patient and his friend, and they verbalize understanding.  They are in agreement with plan for outpatient eval.     ____________________________________________    Time seen: October 07, 2020 11:39 AM    I have reviewed the triage vital signs and the nursing notes.    This visit was not staffed with an ED attending.    Additional Medical Decision Making     I have reviewed the vital signs and the nursing notes. Labs and radiology results that were available during my care of the patient were independently reviewed by me and considered in my medical decision making.   I reviewed the patient's prior medical records (oncology).   I discussed the case with Dr. Dorna Mai, primary oncologist.   History     Chief Complaint  Arm Pain      HPI   Ivan Banks is a 59 y.o. 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  States he is frustrated by lack of improvement in the swelling/functional status of his arm.  He is right hand dominant, but states he has been unable to use his arm for the last 2 months.  Denies any new chest pain, difficulty breathing, fevers.  Denies any acute changes to his pain or swelling.  States was supposed to be referred to a specialist, but has not heard anything from anyone, and did not know where else to go, so came to the ED.      Past Medical History:   Diagnosis Date   ??? Anemia    ??? Anxiety    ??? Cancer (CMS-HCC)    ??? Chronic back pain    ??? Diabetes mellitus (CMS-HCC) 08/08/2020   ??? Hypertension    ??? Substance abuse (CMS-HCC)        Patient Active Problem List   Diagnosis   ??? Anemia   ???  Cervical neck pain with evidence of disc disease   ??? Left shoulder pain   ??? Sarcoma (CMS-HCC)   ??? Insomnia due to mental disorder   ??? PTSD (post-traumatic stress disorder)   ??? GAD (generalized anxiety disorder)   ??? Alcohol use disorder, moderate, dependence (CMS-HCC)   ??? Right arm pain   ??? Hyperosmolar hyperglycemic state (HHS) (CMS-HCC)   ??? History of sarcoma   ??? Acute deep vein thrombosis (DVT) of brachial vein of right upper extremity (CMS-HCC)   ??? Other chronic pain   ??? Diabetes mellitus (CMS-HCC)   ??? Ankle edema, bilateral   ??? Type 2 diabetes mellitus without complication, with long-term current use of insulin (CMS-HCC)   ??? Cancer associated pain       Past Surgical History:   Procedure Laterality Date   ??? IR INSERT PORT AGE GREATER THAN 5 YRS  08/12/2020    IR INSERT PORT AGE GREATER THAN 5 YRS 08/12/2020 Jobe Gibbon, MD IMG VIR [facility]   ??? KNEE ARTHROSCOPY Left 1990       No current facility-administered medications for this encounter.    Current Outpatient Medications:   ???  apixaban (ELIQUIS) 5 mg Tab, Take 1 tablet (5 mg total) by mouth Two (2) times a day., Disp: 180 tablet, Rfl: 3  ???  blood sugar diagnostic (GLUCOSE BLOOD) Strp, Use to check blood sugar as directed with insulin 3 times a day & for symptoms of high or low blood sugar., Disp: 100 strip, Rfl: 3  ???  blood-glucose meter kit, Use as instructed, Disp: 1 each, Rfl: 0  ???  blood-glucose meter kit, Use as instructed, Disp: 1 each, Rfl: 0  ???  EPINEPHrine (EPIPEN) 0.3 mg/0.3 mL injection, Inject 0.3 mL (0.3 mg total) into the muscle once for 1 dose., Disp: 0.3 mL, Rfl: 0  ???  fluoride, sodium, (CLINPRO 5000) 1.1 % Pste, Apply to teeth daily. Brush on teeth twice daily, do not rinse, spit out excess leave on teeth for at least one hr before eating or drinking, Disp: 100 mL, Rfl: 5  ???  gabapentin (NEURONTIN) 100 MG capsule, Take 1 capsule (100 mg total) by mouth nightly. X 2 nights and if tolerating, then increase to 3 caps (300 mg) every night, Disp: 270 capsule, Rfl: 0  ???  insulin glargine (LANTUS SOLOSTAR U-100 INSULIN) 100 unit/mL (3 mL) injection pen, Inject 0.15 mL (15 Units total) under the skin daily. (Patient not taking: Reported on 09/23/2020), Disp: 4.5 mL, Rfl: 0  ???  lancets (FREESTYLE) 28 gauge Misc, Test daily before all meals/snacks and once before bedtime., Disp: 100 each, Rfl: 0  ???  lancets (FREESTYLE) 28 gauge Misc, Test daily before all meals/snacks and once before bedtime., Disp: 3 each, Rfl: 0  ???  lancets Misc, Use to check blood sugar as directed with insulin 3 times a day & for symptoms of high or low blood sugar., Disp: 100 each, Rfl: 0  ???  lisinopriL (PRINIVIL,ZESTRIL) 20 MG tablet, Take 1 tablet (20 mg total) by mouth daily., Disp: 90 tablet, Rfl: 2  ???  metFORMIN (GLUCOPHAGE-XR) 500 MG 24 hr tablet, Take 2 tablets (1,000 mg total) by mouth daily with evening meal., Disp: 90 tablet, Rfl: 3  ???  naloxone (NARCAN) 4 mg nasal spray, One spray in either nostril once for known/suspected opioid overdose. May repeat every 2-3 minutes in alternating nostril til EMS arrives, Disp: 2 each, Rfl: 0  ???  OLANZapine (ZYPREXA) 5 MG tablet,  Take 1 tablet (5 mg total) by mouth nightly. Take for the first 4 days after chemotherapy, Disp: 4 tablet, Rfl: 5  ???  ondansetron (ZOFRAN) 8 MG tablet, Take 1 tablet (8 mg total) by mouth every eight (8) hours as needed for nausea (or vomiting)., Disp: 30 tablet, Rfl: 2  ???  oxyCODONE (ROXICODONE) 10 mg immediate release tablet, TAKE ONE TABLET BY MOUTH EVERY 8 HOURS AS NEEDED FOR FOR PAIN, Disp: 90 tablet, Rfl: 0  ???  oxyCODONE myristate (XTAMPZA ER) 18 mg CSpT, Take 18 mg by mouth every twelve (12) hours., Disp: 60 each, Rfl: 0  ???  pegfilgrastim-cbqv (UDENYCA) 6 mg/0.6 mL injection, Inject the contents of 1 syringe (0.19mL) under the skin once every 21 days. Inject 24-72 hours after last chemotherapy dose on day 1., Disp: 0.6 mL, Rfl: 4  ???  pen needle, diabetic 31 gauge x 1/4 (6 mm) Ndle, Use for insulin administration daily, Disp: 100 each, Rfl: 0  ???  polyethylene glycol (MIRALAX) 17 gram/dose powder, Take 17 g by mouth two (2) times a day as needed (constipation)., Disp: 850 g, Rfl: 0  ???  prochlorperazine (COMPAZINE) 10 MG tablet, Take 1 tablet (10 mg total) by mouth every six (6) hours as needed (nausea or vomitting)., Disp: 30 tablet, Rfl: 2  ???  silver sulfaDIAZINE (SILVADENE, SSD) 1 % cream, Apply to affected area daily, Disp: 50 g, Rfl: 3    Allergies  Venom-honey bee, Aspirin, and Morphine    Family History   Problem Relation Age of Onset   ??? Cancer Mother    ??? Diabetes Father    ??? Kidney disease Father    ??? Diabetes Sister    ??? Lupus Sister    ??? No Known Problems Brother        Social History  Social History     Tobacco Use   ??? Smoking status: Never Smoker   ??? Smokeless tobacco: Never Used   Vaping Use   ??? Vaping Use: Never used   Substance Use Topics   ??? Alcohol use: Not Currently   ??? Drug use: Yes     Frequency: 21.0 times per week     Types: Marijuana, Benzodiazepines     Comment: Smokes 2-3x a day       Review of Systems    A complete review of systems was performed and is negative other than as addressed in the HPI.    Physical Exam     ED Triage Vitals [10/07/20 1122]   Enc Vitals Group      BP 158/69      Heart Rate 84      SpO2 Pulse       Resp 18      Temp 36.6 ??C (97.8 ??F)      Temp Source Skin      SpO2 100 %      Weight       Height       Head Circumference       Peak Flow       Pain Score       Pain Loc       Pain Edu?       Excl. in GC?        Constitutional: Alert and oriented. Well appearing and in no distress.  Eyes: Conjunctivae are normal.  ENT       Head: Normocephalic and atraumatic.       Mouth/Throat: Mucous membranes are moist.  Neck: Supple  Cardiovascular: Normal rate, regular rhythm.   Respiratory: Normal respiratory effort. Breath sounds are normal.  Musculoskeletal:     RUE: Moderate edema to hand, forearm, and lower upper arm, though compartments soft.  Good radial pulse.  No redness or erythema.  Sensation intact distally.  Neurologic: Normal speech and language. No gross focal neurologic deficits are appreciated.  Skin: Skin is warm, dry and intact. No rash noted.  Psychiatric: Mood and affect are normal. Speech and behavior are normal.    Pertinent labs & imaging results that were available during my care of the patient were reviewed by me and considered in my medical decision making (see chart for details).     Evaristo Bury, FNP  10/07/20 1218

## 2020-10-07 NOTE — Unmapped (Signed)
PT reports blood clots in right arm. Pt on eliquis right arm. Pt reports right arm and axilla swelling, intermittent numbness and tingling, and tender to touch. HX of sarcoma - on chemo

## 2020-10-09 MED FILL — UDENYCA 6 MG/0.6 ML SUBCUTANEOUS SYRINGE: 21 days supply | Qty: 0.6 | Fill #2

## 2020-10-09 MED FILL — UDENYCA 6 MG/0.6 ML SUBCUTANEOUS SYRINGE: 21 days supply | Qty: 1 | Fill #2 | Status: AC

## 2020-10-14 ENCOUNTER — Ambulatory Visit: Admit: 2020-10-14 | Discharge: 2020-10-15 | Payer: PRIVATE HEALTH INSURANCE

## 2020-10-14 ENCOUNTER — Ambulatory Visit
Admit: 2020-10-14 | Discharge: 2020-10-15 | Payer: PRIVATE HEALTH INSURANCE | Attending: Student in an Organized Health Care Education/Training Program | Primary: Student in an Organized Health Care Education/Training Program

## 2020-10-14 ENCOUNTER — Other Ambulatory Visit: Admit: 2020-10-14 | Discharge: 2020-10-15 | Payer: PRIVATE HEALTH INSURANCE

## 2020-10-14 DIAGNOSIS — I10 Essential (primary) hypertension: Principal | ICD-10-CM

## 2020-10-14 DIAGNOSIS — Z7901 Long term (current) use of anticoagulants: Principal | ICD-10-CM

## 2020-10-14 DIAGNOSIS — F411 Generalized anxiety disorder: Principal | ICD-10-CM

## 2020-10-14 DIAGNOSIS — Z5111 Encounter for antineoplastic chemotherapy: Principal | ICD-10-CM

## 2020-10-14 DIAGNOSIS — Z515 Encounter for palliative care: Principal | ICD-10-CM

## 2020-10-14 DIAGNOSIS — Z794 Long term (current) use of insulin: Principal | ICD-10-CM

## 2020-10-14 DIAGNOSIS — G629 Polyneuropathy, unspecified: Principal | ICD-10-CM

## 2020-10-14 DIAGNOSIS — E109 Type 1 diabetes mellitus without complications: Principal | ICD-10-CM

## 2020-10-14 DIAGNOSIS — C499 Malignant neoplasm of connective and soft tissue, unspecified: Principal | ICD-10-CM

## 2020-10-14 DIAGNOSIS — Z0289 Encounter for other administrative examinations: Principal | ICD-10-CM

## 2020-10-14 DIAGNOSIS — Z85831 Personal history of malignant neoplasm of soft tissue: Principal | ICD-10-CM

## 2020-10-14 DIAGNOSIS — G893 Neoplasm related pain (acute) (chronic): Principal | ICD-10-CM

## 2020-10-14 LAB — URINALYSIS
BACTERIA: NONE SEEN /HPF
BILIRUBIN UA: NEGATIVE
GLUCOSE UA: NEGATIVE
KETONES UA: NEGATIVE
LEUKOCYTE ESTERASE UA: NEGATIVE
NITRITE UA: NEGATIVE
PH UA: 7 (ref 5.0–9.0)
RBC UA: 6 /HPF — ABNORMAL HIGH (ref <=3)
SPECIFIC GRAVITY UA: 1.025 (ref 1.003–1.030)
SQUAMOUS EPITHELIAL: <1 /HPF (ref 0–5)
UROBILINOGEN UA: 2 — AB
WBC UA: 2 /HPF (ref <=2)

## 2020-10-14 LAB — COMPREHENSIVE METABOLIC PANEL
ALBUMIN: 3.2 g/dL — ABNORMAL LOW (ref 3.4–5.0)
ALKALINE PHOSPHATASE: 183 U/L — ABNORMAL HIGH (ref 46–116)
ALT (SGPT): 28 U/L (ref 10–49)
ANION GAP: 8 mmol/L (ref 5–14)
AST (SGOT): 70 U/L — ABNORMAL HIGH (ref <=34)
BILIRUBIN TOTAL: 0.4 mg/dL (ref 0.3–1.2)
BLOOD UREA NITROGEN: 18 mg/dL (ref 9–23)
BUN / CREAT RATIO: 32
CALCIUM: 9.5 mg/dL (ref 8.7–10.4)
CHLORIDE: 102 mmol/L (ref 98–107)
CO2: 27 mmol/L (ref 20.0–31.0)
CREATININE: 0.57 mg/dL — ABNORMAL LOW
EGFR CKD-EPI AA MALE: >90 mL/min/{1.73_m2} (ref >=60)
EGFR CKD-EPI NON-AA MALE: >90 mL/min/{1.73_m2} (ref >=60)
GLUCOSE RANDOM: 139 mg/dL (ref 70–179)
POTASSIUM: 3.7 mmol/L (ref 3.4–4.5)
PROTEIN TOTAL: 7.2 g/dL (ref 5.7–8.2)
SODIUM: 137 mmol/L (ref 135–145)

## 2020-10-14 LAB — CBC W/ AUTO DIFF
BASOPHILS ABSOLUTE COUNT: 0.1 10*9/L (ref 0.0–0.1)
BASOPHILS RELATIVE PERCENT: 0.6 %
EOSINOPHILS ABSOLUTE COUNT: 0 10*9/L (ref 0.0–0.4)
EOSINOPHILS RELATIVE PERCENT: 0.6 %
HEMATOCRIT: 25.3 % — ABNORMAL LOW (ref 41.0–53.0)
HEMOGLOBIN: 7.9 g/dL — ABNORMAL LOW (ref 13.5–17.5)
LARGE UNSTAINED CELLS: 1 % (ref 0–4)
LYMPHOCYTES ABSOLUTE COUNT: 0.8 10*9/L — ABNORMAL LOW (ref 1.5–5.0)
LYMPHOCYTES RELATIVE PERCENT: 10 %
MEAN CORPUSCULAR HEMOGLOBIN CONC: 31.2 g/dL (ref 31.0–37.0)
MEAN CORPUSCULAR HEMOGLOBIN: 35.1 pg — ABNORMAL HIGH (ref 26.0–34.0)
MEAN CORPUSCULAR VOLUME: 112.2 fL — ABNORMAL HIGH (ref 80.0–100.0)
MEAN PLATELET VOLUME: 9 fL (ref 7.0–10.0)
MONOCYTES ABSOLUTE COUNT: 0.5 10*9/L (ref 0.2–0.8)
MONOCYTES RELATIVE PERCENT: 6.5 %
NEUTROPHILS ABSOLUTE COUNT: 6.2 10*9/L (ref 2.0–7.5)
NEUTROPHILS RELATIVE PERCENT: 81.2 %
PLATELET COUNT: 360 10*9/L (ref 150–440)
RED BLOOD CELL COUNT: 2.25 10*12/L — ABNORMAL LOW (ref 4.50–5.90)
RED CELL DISTRIBUTION WIDTH: 17.8 % — ABNORMAL HIGH (ref 12.0–15.0)
WBC ADJUSTED: 7.6 10*9/L (ref 4.5–11.0)

## 2020-10-14 LAB — TOXICOLOGY SCREEN, URINE
AMPHETAMINE SCREEN URINE: NEGATIVE
BARBITURATE SCREEN URINE: NEGATIVE
BENZODIAZEPINE SCREEN, URINE: NEGATIVE
BUPRENORPHINE, URINE SCREEN: NEGATIVE
CANNABINOID SCREEN URINE: POSITIVE — AB
COCAINE(METAB.)SCREEN, URINE: NEGATIVE
FENTANYL SCREEN, URINE: NEGATIVE
METHADONE SCREEN, URINE: NEGATIVE
OPIATE SCREEN URINE: POSITIVE — AB
OXYCODONE SCREEN URINE: POSITIVE — AB

## 2020-10-14 LAB — SLIDE REVIEW

## 2020-10-14 MED ORDER — OXYCODONE 10 MG TABLET
ORAL_TABLET | Freq: Four times a day (QID) | ORAL | 0 refills | 30.00000 days | Status: CP | PRN
Start: 2020-10-14 — End: 2020-10-15

## 2020-10-14 MED ORDER — DULOXETINE 30 MG CAPSULE,DELAYED RELEASE
ORAL_CAPSULE | Freq: Every morning | ORAL | 0 refills | 30.00000 days | Status: CP
Start: 2020-10-14 — End: 2020-10-15

## 2020-10-14 MED ORDER — XTAMPZA ER 18 MG CAPSULE SPRINKLE
Freq: Two times a day (BID) | ORAL | 0 refills | 30.00000 days | Status: CP
Start: 2020-10-14 — End: 2020-10-15

## 2020-10-14 MED ADMIN — sodium chloride (NS) 0.9 % infusion: 100 mL/h | INTRAVENOUS | @ 18:00:00 | Stop: 2020-10-15

## 2020-10-14 MED ADMIN — fosaprepitant (EMEND) 150 mg in sodium chloride (NS) 0.9 % 100 mL IVPB: 150 mg | INTRAVENOUS | @ 18:00:00 | Stop: 2020-10-14

## 2020-10-14 MED ADMIN — dexAMETHasone (DECADRON) tablet 12 mg: 12 mg | ORAL | @ 18:00:00 | Stop: 2020-10-14

## 2020-10-14 MED ADMIN — ondansetron (ZOFRAN) tablet 16 mg: 16 mg | ORAL | @ 18:00:00 | Stop: 2020-10-14

## 2020-10-14 MED ADMIN — OLANZapine (ZYPREXA) tablet 5 mg: 5 mg | ORAL | @ 18:00:00 | Stop: 2020-10-14

## 2020-10-14 MED ADMIN — heparin, porcine (PF) 100 unit/mL injection 500 Units: 500 [IU] | INTRAVENOUS | @ 20:00:00 | Stop: 2020-10-15

## 2020-10-14 MED ADMIN — DOXOrubicin (ADRIAMYCIN) syringe: 75 mg/m2 | INTRAVENOUS | @ 20:00:00 | Stop: 2020-10-14

## 2020-10-14 NOTE — Unmapped (Signed)
OUTPATIENT ONCOLOGY PALLIATIVE CARE    Principal Diagnosis: [name] is a 59 y.o. 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     Assessment/Plan:   1. Worsening right axilla and right arm pain due to sarcomas-appears to have a neuropathic component to his pain with a hx of left lower back pain.   -Continue Xtampza ER 18 mg twice daily.  -Increase oxycodone 10 mg every 8 hours as needed TO oxycodone 10 mg every 6 hours as needed.  -Stop gabapentin-produce dizziness.  -Start Cymbalta 30 mg every morning.      2.  Goals of care-Not addressed today  At prior visits: Verbalizing some frustration over the limited capacity he has with his right arm.  Shared a year ago he was so active in the work that he has done in the past and the following day he was diagnosed with cancer and everything changed so abruptly.  Shared that he does have moments of being short tempered with his girlfriend at times.  Finds the support with the [facility] is helpful.   -Shared with [name] that I am unsure if he is ever going to have the full capacity of his right arm like he had in the past.  -Hoping to connect with an MSW in his community to assess for additional support in his community.  If not, will work with the [employer] social work team.  -Encouraged him to do things that he finds enjoyable such as attend [employer] football games.  Has received the Covid vaccine.  -Will connect with Dr. Dorna Mai to discuss possibility of stent per clinic notes and [name]'s comment that he remembers this being a possible treatment option during his stay. 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   - Patient has a signed pain medication agreement with Outpatient Palliative Care, completed on 09/02/20, as per standard of care.   - [facility] database was reviewed today and it was appropriate.   - Urine drug screen was performed at this visit. Findings: appropriate. + for marijuana which [name] shared that uses.   - Patient has received information about safe storage and administration of medications.   - Patient has received a prescription for narcan.      F/u: 4 wks during infusion 12/14    ----------------------------------------  Referring Provider: Dr. Dorna Mai  Oncology Team: Dr. 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 Had palliative XRT to L forearm in 11/2019.  07/31/2020 hospitalized and diagnosed with new onset type 1 diabetes mellitus. 07/2020 with Occlusive thrombus seen within the mid right brachial vein. Onc notes on 07/29/20: Recommended discontinuing olaparib and pursuing doxorubicin as second line therapy. 07/25/20 scans showing decrease in size of pulmonary metastasis and worsening nodal and left posterior chest wall metastasis. PERITONEUM/RETROPERITONEUM AND MESENTERY/SOFT TISSUES with numerous metastatic deposits are again noted. Paraesophageal measures 5.1 x 2.8 cm, Left posterior  pelvic mass measuring 5.2 x 4.2 cm and left iliac mass measures 6.5 x 6.2 cm.    Primary pain is left lower back that has been occurring in the last 1 and half months.  Describes it as a stabbing sensation with needle sticking in his back.  Pain intensity is 8-10/10.  Followed by his right axilla and describes this as a burning sensation.  Has been taking Xtampza ER 9 mg twice a day, oxycodone 5 mg about 4 tablets/day and ibuprofen about 6 tablets this past week.  Reports that the oxycodone did work when he initially started it but does not feel like it is helping that much now.  Feels that the ibuprofen is beneficial.  Recently started Eliquis for thrombosis in right arm. Also appreciating a decreased range of motion in his right arm and it remained swollen.    Energy improved after receiving the blood transfusion.  Is independent with ADLs except needs help with dressing due to the limited range of motion with his right arm.  Appetite is slowly improving and weight is currently 158 pounds.  At one point he was down to 140 pounds.  Baseline weight is 165 pounds.  No issues with nausea.  Taking olanzapine for days after chemotherapy.  Bowel movements are every 2 to 3 days and using Dulcolax.    Mood has been good.  Notes indicate alcohol and benzodiazepine use. [name] did endorse that when he took the benzodiazepine and things were prescription meds given to him.  He is no longer drinking alcohol after having had the radiation which is altered his taste for alcohol.  Does smoke marijuana.      Current cancer-directed therapy: DOXORUBICIN    Interval hx 09/17/20 CK and [name]    Hospitalized from 10/20 to 10/21 for right arm swelling. ED ordered RUE PVL which showed non-compressible brachial veins, basilic vein, and commented findings were essentially unchanged compared to prior exam on 08/06/20. Follow up CT Chest with contrast was notable for severe luminal narrowing of R brachiocephalic v by a bulky superior mediastinal LN with overall stable dx. Increased eliquis and will be on 10 mg bid.      Does comment that the pain is 12/10 but when he takes the pain medication he does get lots of relief that lasts for approximately 6 hours.  Shared that yesterday he took the exams oxycodone 10 mg 3 times a day and the Xtampza ER 18 mg twice a day.  Did start the gabapentin but only took it for 1 night and appeared to tolerate it.  Lying flat also provides pain relief.  Describes that his skin is tight. Continues to have the swelling in his right hand, right forearm elbow and bicep and up into his axilla.    Interval hx 10/14/20 CK and Vickki Muff that the pain in his right axilla and right upper arm continues. Feels like the swelling in his arm is enlarging and even went to the ED department last week because he was concerned about the swelling.  Reports that they are days that he needs the oxycodone 10 mg 4 times a day and not 3 times a day as ordered to bring the pain to a tolerable range.  Continues to take the xtampza 18 mg twice a day.  Describes the pain as a pulling sensation that radiates down to his elbow.  Has some needlelike pain in his thumb.  His back pain is eased off.  He  was not able to tolerate the gabapentin produced dizziness.  Comments that the nodules in his leg and arm feels smaller.       Symptom Review:  General: pretty good  Pain: See interval history  Mobility: Remains active.  He has been building a Midwife. Takes breaks.  Appetite: Good  Nausea: Not assessed  Bowel function: QD  Mood: overall good.  Does report sometimes that he is short tempered with his girlfriend.      Palliative Performance Scale: 80% - Ambulation: Full / Normal Activity with effort, some evidence of disease / Self-Care:Full / Intake: Normal or reduced / Level of Conscious: Full      Coping/Support Issues: Has good support with his family and friends.  He lives with his girlfriend Waynetta Sandy and his brother Lenard Galloway is very supportive.  He does not have children.  He was in accompanied by his friend to him at the initial visit.  Shares that he has good neighbors and his brother-in-law to help him.    Goals of Care: To beat at the cancer.  Short-term goals of completing his tiny house that he has been working on the last couple years.    Social History:   Name of primary support: Beth and Lenard Galloway  Occupation: Did work as a Engineer, structural and quit in 2014.  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  Hobbies: Enjoys working on his tiny house. Enjoys yard Airline pilot.  Current residence / distance from [facility]: 1 hour and 20 minutes away.    Advance Care Planning:   HCPOA:  Natural surrogate decision maker: Lenard Galloway his brother and Waynetta Sandy his girlfriend.  Living Will:  ACP note:     Objective     Opioid Risk Tool:     Male  Male    Family history of substance abuse      Alcohol  1  3    Illegal drugs  2  3    Rx drugs  4  4    Personal history of substance abuse      Alcohol  3  3    Illegal drugs  4  4    Rx drugs  5  5    Age between 25???45 years  1  1    History of preadolescent sexual abuse  3  0    Psychological disease      ADD, OCD, bipolar, schizophrenia  2  2    Depression  1  1       Total: 3  (<3 low risk, 4-7 moderate risk, >8 high risk)    Oncology History   Sarcoma (CMS-HCC)   07/10/2019 Initial Diagnosis    Sarcoma (CMS-HCC)     05/14/2020 - 06/24/2020 Chemotherapy    STUDY [id number] IRB# [id number] OLAPARIB (v. 07/31/19)  A Phase 2 Study of PARP Inhibitor Olaparib ([id number]) in IDH1 and IDH2 Mutant Advanced Solid Tumors     08/12/2020 -  Chemotherapy    OP SARCOMA DOXORUBICIN  DOXOrubicin 75 mg/m2 IV on day 1, every 21 days         Patient Active Problem List   Diagnosis   ??? Anemia   ??? Cervical neck pain with evidence of disc disease   ??? Left shoulder pain   ??? Sarcoma (CMS-HCC)   ??? Insomnia due to mental disorder   ??? PTSD (post-traumatic stress disorder)   ??? GAD (generalized anxiety  disorder)   ??? Alcohol use disorder, moderate, dependence (CMS-HCC)   ??? Right arm pain   ??? Hyperosmolar hyperglycemic state (HHS) (CMS-HCC)   ??? History of sarcoma   ??? Acute deep vein thrombosis (DVT) of brachial vein of right upper extremity (CMS-HCC)   ??? Other chronic pain   ??? Diabetes mellitus (CMS-HCC)   ??? Ankle edema, bilateral   ??? Type 2 diabetes mellitus without complication, with long-term current use of insulin (CMS-HCC)   ??? Cancer associated pain       Past Medical History:   Diagnosis Date   ??? Anemia    ??? Anxiety    ??? Cancer (CMS-HCC)    ??? Chronic back pain    ??? Diabetes mellitus (CMS-HCC) 08/08/2020   ??? Hypertension    ??? Substance abuse (CMS-HCC)        Past Surgical History:   Procedure Laterality Date   ??? IR INSERT PORT AGE GREATER THAN 5 YRS  08/12/2020    IR INSERT PORT AGE GREATER THAN 5 YRS 08/12/2020 Jobe Gibbon, MD IMG VIR [facility]   ??? KNEE ARTHROSCOPY Left 1990       Current Outpatient Medications   Medication Sig Dispense Refill   ??? apixaban (ELIQUIS) 5 mg Tab Take 1 tablet (5 mg total) by mouth Two (2) times a day. 180 tablet 3   ??? blood sugar diagnostic (GLUCOSE BLOOD) Strp Use to check blood sugar as directed with insulin 3 times a day & for symptoms of high or low blood sugar. 100 strip 3   ??? blood-glucose meter kit Use as instructed 1 each 0   ??? blood-glucose meter kit Use as instructed 1 each 0   ??? EPINEPHrine (EPIPEN) 0.3 mg/0.3 mL injection Inject 0.3 mL (0.3 mg total) into the muscle once for 1 dose. 0.3 mL 0   ??? fluoride, sodium, (CLINPRO 5000) 1.1 % Pste Apply to teeth daily. Brush on teeth twice daily, do not rinse, spit out excess leave on teeth for at least one hr before eating or drinking 100 mL 5   ??? gabapentin (NEURONTIN) 100 MG capsule Take 1 capsule (100 mg total) by mouth nightly. X 2 nights and if tolerating, then increase to 3 caps (300 mg) every night 270 capsule 0   ??? insulin glargine (LANTUS SOLOSTAR U-100 INSULIN) 100 unit/mL (3 mL) injection pen Inject 0.15 mL (15 Units total) under the skin daily. 4.5 mL 0   ??? lancets (FREESTYLE) 28 gauge Misc Test daily before all meals/snacks and once before bedtime. 100 each 0   ??? lancets (FREESTYLE) 28 gauge Misc Test daily before all meals/snacks and once before bedtime. 3 each 0   ??? lancets Misc Use to check blood sugar as directed with insulin 3 times a day & for symptoms of high or low blood sugar. 100 each 0   ??? lisinopriL (PRINIVIL,ZESTRIL) 20 MG tablet Take 1 tablet (20 mg total) by mouth daily. 90 tablet 2   ??? metFORMIN (GLUCOPHAGE-XR) 500 MG 24 hr tablet Take 2 tablets (1,000 mg total) by mouth daily with evening meal. 90 tablet 3   ??? naloxone (NARCAN) 4 mg nasal spray One spray in either nostril once for known/suspected opioid overdose. May repeat every 2-3 minutes in alternating nostril til EMS arrives 2 each 0   ??? OLANZapine (ZYPREXA) 5 MG tablet Take 1 tablet (5 mg total) by mouth nightly. Take for the first 4 days after chemotherapy 4 tablet 5   ??? ondansetron (  ZOFRAN) 8 MG tablet Take 1 tablet (8 mg total) by mouth every eight (8) hours as needed for nausea (or vomiting). 30 tablet 2   ??? oxyCODONE (ROXICODONE) 10 mg immediate release tablet TAKE ONE TABLET BY MOUTH EVERY 8 HOURS AS NEEDED FOR FOR PAIN 90 tablet 0   ??? oxyCODONE myristate (XTAMPZA ER) 18 mg CSpT Take 18 mg by mouth every twelve (12) hours. 60 each 0   ??? pegfilgrastim-cbqv (UDENYCA) 6 mg/0.6 mL injection Inject the contents of 1 syringe (0.61mL) under the skin once every 21 days. Inject 24-72 hours after last chemotherapy dose on day 1. 0.6 mL 4   ??? pen needle, diabetic 31 gauge x 1/4 (6 mm) Ndle Use for insulin administration daily 100 each 0   ??? polyethylene glycol (MIRALAX) 17 gram/dose powder Take 17 g by mouth two (2) times a day as needed (constipation). 850 g 0   ??? prochlorperazine (COMPAZINE) 10 MG tablet Take 1 tablet (10 mg total) by mouth every six (6) hours as needed (nausea or vomitting). 30 tablet 2   ??? silver sulfaDIAZINE (SILVADENE, SSD) 1 % cream Apply to affected area daily 50 g 3     No current facility-administered medications for this encounter.       Allergies:   Allergies   Allergen Reactions   ??? Venom-Honey Bee Anaphylaxis   ??? Aspirin Other (See Comments)     Stomach pain   ??? Morphine Itching       Family History:  Cancer-related family history includes Cancer in his mother.  He indicated that his mother is deceased. He indicated that his father is deceased. He indicated that his sister is alive. He indicated that his brother is alive.      REVIEW OF SYSTEMS:  A comprehensive review of 14 systems was negative except for pertinent positives noted in HPI.          Lab Results   Component Value Date    CREATININE 0.57 (L) 10/14/2020     Lab Results   Component Value Date    ALKPHOS 183 (H) 10/14/2020    BILITOT 0.4 10/14/2020    PROT 7.2 10/14/2020    ALBUMIN 3.2 (L) 10/14/2020    ALT 28 10/14/2020    AST 70 (H) 10/14/2020           Vital signs for this encounter: VS reviewed in EPIC.  GEN: Awake and alert & in no acute distress  PSYCH: Alert and oriented to person, place and time. Euthymic.  HEENT: Pupils equally round without scleral icterus. No facial asymmetry.  LUNGS: No increased work of breathing.  SKIN: No rashes, petechiae or jaundice noted on visible skin  EXT:  edema noted on right arm with more edema in upper arm.  NEURO: Normal gait and coordination.        Pam Drown, FNP-BC, [facility]  Outpatient Oncology Palliative Care Service  [facility]  [address] [place], [place] [address]  [phone]       Time spent with patient was 30 minutes.  Additional 15 minutes were spent on preparation, document eating and coordinating care.

## 2020-10-14 NOTE — Unmapped (Signed)
PLAN FROM TODAY:    ?? We discussed that you are tolerating chemotherapy well, we will continue with your next treatment today  ?? Come back and see [place] in clinic in 3 weeks with labs, scans and your next treatment      ?? Support for cancer patients and their caregivers during the COVID-19 pandemic [email or web address]  ?? Keeping cancer patients safe during the COVID-19 crisis [email or web address]  +++++++++++++++++++++++++++++++++++++++++++++++++++++++++++  Your nurse navigator is:  Roseanne Reno, RN, BSN, OCN   Head & Neck/Sarcoma  Oncology Nurse Navigator  [email or web address]    For appointments & questions Monday through Friday 8 AM??? 5 PM   please call [phone] or Toll free [phone].    On Nights, Weekends and Holidays  Call [phone] and ask for the adult hematologist-oncologist on call.    [facility]  [address]  [place], [place] [address]  [email or web address]  Tel: [phone] / Toll Free [phone]  Fax: [phone]  +++++++++++++++++++++++++++++++++++++++++++++++++++++++++++

## 2020-10-14 NOTE — Unmapped (Unsigned)
Port accessed.  Labs drawn & sent for analysis.  To next appt.  Care provided by Felton Clinton RN.

## 2020-10-14 NOTE — Unmapped (Signed)
Patient tolerated Doxorubicin push without complication. Port hep locked and dc'ed; no sign of infiltration. No questions/concerns.

## 2020-10-15 MED ORDER — DULOXETINE 30 MG CAPSULE,DELAYED RELEASE
ORAL_CAPSULE | Freq: Every morning | ORAL | 0 refills | 30.00000 days | Status: CP
Start: 2020-10-15 — End: 2020-11-24

## 2020-10-15 MED ORDER — XTAMPZA ER 18 MG CAPSULE SPRINKLE
Freq: Two times a day (BID) | ORAL | 0 refills | 30.00000 days | Status: SS
Start: 2020-10-15 — End: 2020-11-24

## 2020-10-15 MED ORDER — OXYCODONE 10 MG TABLET
ORAL_TABLET | Freq: Four times a day (QID) | ORAL | 0 refills | 30.00000 days | Status: SS | PRN
Start: 2020-10-15 — End: 2020-11-24

## 2020-10-15 NOTE — Unmapped (Signed)
Hi,     [place] contacted the Communication Center requesting to speak with the care team of Ivan Banks to discuss:      Needs oxycodone RX sent to [facility]     Please contact at [phone].    Program: Sarcoma  Speciality: Medical Oncology    Check Indicates criteria has been reviewed and confirmed with the patient:    []  Preferred Name   [x]  DOB and/or MR#  [x]  Preferred Contact Method  [x]  Phone Number(s)   []  MyChart     Thank you,   Vernie Ammons  [facility]   [phone]

## 2020-10-16 NOTE — Unmapped (Signed)
Encounter addended by: Burnis Kingfisher, FNP on: 10/15/2020 4:06 PM   Actions taken: Clinical Note Signed

## 2020-10-16 NOTE — Unmapped (Signed)
Encounter addended by: Burnis Kingfisher, FNP on: 10/15/2020 4:05 PM   Actions taken: Clinical Note Signed

## 2020-10-20 LAB — OPIATE, URINE, QUANTITATIVE
CODEINE-BY LC-MS/MS: NEGATIVE ng/mL
HYDROCODONE-BY LC-MS/MS: NEGATIVE ng/mL
HYDROMORPHONE-BY LC-MS/MS: NEGATIVE ng/mL
MORPHINE-BY LC-MS/MS: NEGATIVE ng/mL
NORHYDROCODONE-BY LC-MS/MS: NEGATIVE ng/mL
OPIATES INTERPRETATION: POSITIVE
OXYCODONE-BY LC-MS/MS: 2652 ng/mL
OXYMORPHONE-BY LC-MS/MS: 2527 ng/mL

## 2020-10-22 NOTE — Unmapped (Signed)
[place] SARCOMA ONCOLOGY FOLLOW UP VISIT    Encounter Date: 10/14/2020  Patient Name: Ivan Banks  Medical Record Number: [id number]    Referring Physician: Dr Edilia Bo, [facility]     Consulting Physician: Dr Glennon Mac, [place] [place], [place]    Primary Care Provider: Suanne Marker, MD     DIAGNOSIS: STS RIGHT forearm, with presumed lung and axillary metastases    ASSESSMENT: 59 y.o. 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  I reviewed the [facility] Sarcoma Targeted Gene Fusion Panel results for this patient, which did not note fusions in NR4A3 (extraskeletal myxoid chondrosarcoma) or HEY1-NCOA2 (mesenchymal chondrosarcoma).  Patient enrolled on  [id number] olaparib trial for his IDH-mutated disease but taken off-study after 2 cycles of olaparib due to mixed response / progression on imaging.  Now on doxorubicin as second-line therapy with stable to mildly improved disease on imaging after 2 cycles.    Today we discussed he continues to tolerate therapy well.  Will proceed with C4 today and re-evaluate after restaging imaging prior to C5.  He's tolerating well and is agreeable to continue this therapy.    - RTC 3 weeks with labs, scans for consideration of doxorubicin C5      Pain:   Much improved on long and short acting opiate regimen.  Follows with [facility] palliative care      Right brachial vein DVT:    PVL ultrasound 08/06/2020 revealed occlusive thrombus in right mid brachial vein.  Patient reports having an IV at the site of the swelling that infiltrated during hospitalization for elevated glucose levels, and that pain and swelling (both at immediate IV site as well as distal arm) started while IV was in place.  Risk factors are active malignancy.  We again discussed this may be catheter-related and, while evidence supports continuing anticoagulation long-term for lower extremity DVTs given pro-thrombotic malignancy, we could reconsider this in the future depending on the clinical circumstances (e.g. how well he tolerates therapy, cost, tumor burden).  He confirms is taking 5mg  BID as directed.  We previously discussed evaluation by vascular surgery given ongoing swelling despite therapeutic anticoagulation.  - Continue apixabin 5mg  BID  - Vascular surgery evaluation upcoming    I personally reviewed the medical records, pathology and laboratory results and viewed the imaging. All questions were answered to the patient's satisfaction and they voiced understanding and agreement with the plan. Pt has my card and contact information and is encouraged to reach out with any further questions.      REASON FOR CONSULTATION:   Ivan Banks is a 59 y.o. male who is seen in consultation at the request of Self, Referred for evaluation of his soft tissue sarcoma.      HISTORY OF PRESENT ILLNESS:      Since his last visit there have been no acute events.  Today reports continued upper arm swelling that is only slightly improved from a couple of weeks prior.  Reports compliance with therapeutic-dosed apixaban.  Has upcoming appointment with [facility] vascular surgery for evaluation. No new issues with chemotherapy, reports appetite stable.      Oncologic timeline:  - 2015-2016: Patient first notes small nodule in middle of RIGHT anterior forearm, nonpainful  - 06/12/19: Meets with local general surgeon, CT  of RUE ordered  - 06/15/19: CT Chest w contrast: Right axillary lymphadenopathy largest 3.7cm, multiple solid/subsolid  Pulmonary nodules bilaterally with largest 8.90mm over right lower lobe.  - 06/25/19: US guided biopsy of R forearm and axillary masses.  Pathology revealed a epitheloid / undifferentiated malignancy suspicious for an undifferentiated sarcoma, IHC (+) Vimentin, ema, CD99 (-) CK7, CK20, OSCAR, S100, HMB-45, Desmin, smooth muscle actin, SOX-10, CEA, Calponin, cytokeratin, CD68, WT1.  In addition, there was equivocal or focal staining for p63.  Staining for INI1 showed nuclear localization (normal result).   - Sept 2020: palliative RT to the right forearm (55Gy/41fx) and R axilla (48Gy/10fx) finished 08/22/2019  - 08/23/2019: STRATA molecular testing: IDH1 pR132C, MS:S, TMB 32mut/Mb (low), also detected variants of uncertain significance: MLL3 M1242K (53% VAF), TAF1L K914E (5% VAF), TPR Q1063* (9% VAF)  - 10/30/19: MRI LEFT forearm: 6.5 x 3.1 x 3.8 cm mass in left flexor compartment, likely focus of metastatic disease  - 12/19/19: Completes palliative XRT to LEFT forearm lesion, 5500cGy with Dr Rayetta Humphrey  - 01/01/20: CT Chest wo contrast: Decrease in right axillary lymphadenopathy, two new right lower lobe and left upper lobe 0.7 cm pulmonary nodules, increased size of left paratracheal node, and increased size of right subpectoral node compatible with metastatic disease  - 04/01/20: CT chest wo contrast: Interval increased size of pulmonary metastasis and multi compartmental mediastinal and right axillary nodal metastasis consistent with disease progression.  Interval prominence of upper abdominal lymphadenopathy, concerning for metastatic disease. Recommend CT abdomen and pelvis for better characterization.  - 05/06/20: CT Chest wo contrast: Increased burden of metastatic disease including increased size of multiple pulmonary nodules, axillary, retrocrural, mediastinal and gastrohepatic lymphadenopathy. Additionally there are peritoneal masses concerning for metastatic disease.  - 05/14/20: C1D1 olaparib per NCI-[id number] (300mg  po BID)  -06/10/20: Interruption in start of C2 due to anemia (14.2 -> 8.3) with C1 olaparib, recheck H&H in 2 weeks  - 06/24/20: Start of C2 olaparib, DR to 250mg  BID  - 07/25/20: CT Chest/Abdomen/Pelvis w contrast: Mixed response, mostly progression, with decrease size of pulmonary nodules and increase in mediastinal, abdominal and pelvic nodal and soft tissue implants.  - 08/12/20: C1 doxorubicin 75mg /m2, tolerated well  - 09/02/20: C2 doxorubicin 75mg /m2, tolerated well  - 09/11/20: Admitted [facility] for RUE pain and swelling, was restarted on apixaban lead-in after discovered he was taking 5mg  once daily  - 09/11/20: CT Chest w contrast: Stable axillary nodes and pulmonary nodules  - 09/23/20: CT C/A/P w contrast: Mixed response with stability in the thorax and some abdominal lesions, with mild to minimal progression in several abdominal lesions.  Mild progression may be due to interval between baseline imaging (07/25/20) and start of C1 doxorubicin (08/12/20)  - 09/24/20: C3 doxorubicin 75mg /m2, tolerated well  - 10/14/20: C4 doxorubicin 75mg /m2 (anticipated)      REVIEW OF SYSTEMS:  A comprehensive review of 12 systems was negative except for pertinent positives noted in HPI.    Past Medical History:   Diagnosis Date   ??? Anemia    ??? Anxiety    ??? Cancer (CMS-HCC)    ??? Chronic back pain    ??? Diabetes mellitus (CMS-HCC) 08/08/2020   ??? Hypertension    ??? Substance abuse (CMS-HCC)    - Pancreatitis 2009  - Last colonoscopy 2009       Past Surgical History:   Procedure Laterality Date   ??? IR INSERT PORT AGE GREATER THAN 5 YRS  08/12/2020  IR INSERT PORT AGE GREATER THAN 5 YRS 08/12/2020 Jobe Gibbon, MD IMG VIR [facility]   ??? KNEE ARTHROSCOPY Left 1990        Family History   Problem Relation Age of Onset   ??? Cancer Mother    ??? Diabetes Father    ??? Kidney disease Father    ??? Diabetes Sister    ??? Lupus Sister    ??? No Known Problems Brother         Social History     Occupational History   ??? Occupation: Boilermaker   ??? Occupation: Nurse, adult   ??? Occupation: Disability   Tobacco Use   ??? Smoking status: Never Smoker   ??? Smokeless tobacco: Never Used   Vaping Use   ??? Vaping Use: Never used   Substance and Sexual Activity   ??? Alcohol use: Not Currently   ??? Drug use: Yes Frequency: 21.0 times per week     Types: Marijuana, Benzodiazepines     Comment: Smokes 2-3x a day   ??? Sexual activity: Yes   - Works on a horse farm  - Previously had routine cannabis use, stopped in order to enroll in clinical trial at [facility] in summer 2021      ALLERGIES/MEDICATIONS:  Reviewed in EPIC    PHYSICAL EXAM:   Vitals: BP 156/77  - Temp 36.6 ??C (97.9 ??F) (Temporal)  - Resp 16  - Wt 72.6 kg (160 lb)  - SpO2 100%  - BMI 21.11 kg/m??   Gen: Comfortable, NAD. ECOG PS 1  Eyes: EOMI, sclera anicteric  ENT:  MMM.   Neck: Supple, trachea midline  Lymph: No cervical, submandibular, or supraclavicular lymphadenopathy.  There is a palpable firm fixed nodule in the deep right axilla, mildly tender today  CV: RRR no murmurs   Lungs: CTAB w/o rales, rhochi, or wheezing, good respiratory effort   GI: +BS, Soft, NT, ND, no masses appreciated   Ext: Warm and well perfused. No cyanosis or clubbing, marked edema of right arm up to the distal humerus.  Fingers are warm with normal cap refill, reduced grip on right due to swelling  Skin: No rashes. 2x5cm darkened macula on right forearm from radiation treatment, left forearm just distal to elbow an area of darkened skin.  No discharge, erythema or pain around the site.  Neuro: Moving all extremities, speech fluid.  Psych: mood euthymic and affect appropriate   MSK: No focal bony deformaties or tenderness    +++++++++++++++++++++++++++++++++++++++++++++++++++++++++++    PATHOLOGY:   Pathology was personally reviewed as described in the HPI, detailed in EPIC.  06/25/2019 Biopsy:  Final Diagnosis   A: Axilla, right, biopsy  - Metastatic, epithelioid/undifferentiated malignancy.  - See diagnostic comment.  ??  B: Forearm, right, biopsy  - Malignant epithelioid malignancy.  - See diagnostic comment.  ??  This electronic signature is attestation that the pathologist personally reviewed the submitted material(s) and the final diagnosis reflects that evaluation.   Electronically signed by Meyer Cory, MD on 07/04/2019 at 1511   Comment    Immunohistochemical staining was performed on this material with the following results: (+) Vimentin, ema, CD99 (-) CK7, CK20, OSCAR, S100, HMB-45, Desmin, smooth muscle actin, SOX-10, CEA, Calponin, cytokeratin, CD68, WT1.  In addition, there was equivocal or focal staining for p63.  Staining for INI1 showed nuclear localization (normal result).  ??  This material was shown in consultation to Dr. Eyvonne Left and Dr. Linton Rump of Dermatopathology.  ??  Overall, this represents a epithelioid malignancy, most likely a sarcoma, of unknown differentiation.  Material will be sent for detection of gene fusions in an effort to further classify.  A supplemental report will follow.     Addendum   Results from the Sarcoma Targeted Gene Fusion Panel (SARCP) performed at [facility]:  ??  No reportable fusions identified in 138 targeted genes.         LABS:  Reviewed in EPIC      RADIOLOGY:  Imaging was personally viewed as summarized in the history (see above).  I agree with the findings/interpretation; details in EPIC.

## 2020-10-28 ENCOUNTER — Emergency Department (HOSPITAL_COMMUNITY)
Admission: EM | Admit: 2020-10-28 | Discharge: 2020-10-28 | Disposition: A | Discharge status: Home / Self Care | Payer: Medicaid Other | Attending: Emergency Medicine | Admitting: Emergency Medicine

## 2020-10-28 ENCOUNTER — Encounter (HOSPITAL_COMMUNITY): Payer: Self-pay | Admitting: *Deleted

## 2020-10-28 ENCOUNTER — Emergency Department (HOSPITAL_COMMUNITY): Payer: Medicaid Other

## 2020-10-28 ENCOUNTER — Other Ambulatory Visit: Payer: Self-pay

## 2020-10-28 DIAGNOSIS — Z79899 Other long term (current) drug therapy: Secondary | ICD-10-CM | POA: Insufficient documentation

## 2020-10-28 DIAGNOSIS — S5001XA Contusion of right elbow, initial encounter: Secondary | ICD-10-CM | POA: Diagnosis not present

## 2020-10-28 DIAGNOSIS — Z7901 Long term (current) use of anticoagulants: Secondary | ICD-10-CM | POA: Insufficient documentation

## 2020-10-28 DIAGNOSIS — M25421 Effusion, right elbow: Secondary | ICD-10-CM

## 2020-10-28 DIAGNOSIS — W19XXXA Unspecified fall, initial encounter: Secondary | ICD-10-CM

## 2020-10-28 DIAGNOSIS — Z7984 Long term (current) use of oral hypoglycemic drugs: Secondary | ICD-10-CM | POA: Diagnosis not present

## 2020-10-28 DIAGNOSIS — W01198A Fall on same level from slipping, tripping and stumbling with subsequent striking against other object, initial encounter: Secondary | ICD-10-CM | POA: Insufficient documentation

## 2020-10-28 DIAGNOSIS — Y9302 Activity, running: Secondary | ICD-10-CM | POA: Diagnosis not present

## 2020-10-28 DIAGNOSIS — R2231 Localized swelling, mass and lump, right upper limb: Secondary | ICD-10-CM | POA: Insufficient documentation

## 2020-10-28 DIAGNOSIS — Z859 Personal history of malignant neoplasm, unspecified: Secondary | ICD-10-CM | POA: Insufficient documentation

## 2020-10-28 DIAGNOSIS — S2231XA Fracture of one rib, right side, initial encounter for closed fracture: Secondary | ICD-10-CM | POA: Diagnosis not present

## 2020-10-28 DIAGNOSIS — S299XXA Unspecified injury of thorax, initial encounter: Secondary | ICD-10-CM | POA: Diagnosis present

## 2020-10-28 MED ORDER — OXYCODONE-ACETAMINOPHEN 5-325 MG PO TABS
2.0000 | ORAL_TABLET | Freq: Once | ORAL | Status: AC
  Administered 2020-10-28: 2 via ORAL
  Filled 2020-10-28: qty 2

## 2020-10-28 MED ORDER — OXYCODONE-ACETAMINOPHEN 10-325 MG PO TABS: 1.0000 | ORAL_TABLET | Freq: Four times a day (QID) | ORAL | 0 refills | Status: AC | PRN

## 2020-10-28 NOTE — Discharge Instructions (Addendum)
There were no serious injuries discovered in the testing today.  You have a single rib fracture, #6 on the right.  This will likely be sore for a month or so.  Avoid exertion and lifting, for now.  Follow-up with your vascular doctor next week to be evaluated for the persistent right elbow swelling.  We sent a short-term prescription to your pharmacy, to get you through until you can refill your chronic pain medication.

## 2020-10-28 NOTE — ED Triage Notes (Signed)
Pt with previous arm swelling and pain to right arm due to blood clots and cancer. Pt fell this past Thursday on grassy area, believes he hit his head and felt "dazed" after the fall for few seconds, denies LOC.  Pt states swelling and pain is worse to right arm since fall.  Pt states on Eliqus.

## 2020-10-28 NOTE — ED Provider Notes (Signed)
[facility] EMERGENCY DEPARTMENT Provider Note   CSN: [id number] Arrival date & time: 10/28/20  1354     History Chief Complaint  Patient presents with  . Fall    [name] is a 59 y.o. male.  HPI He is here for evaluation of right anterior rib pain and right elbow pain, after injury in fall, when he tripped, 6 days ago.  Since then he has been using extra pain medicine, and has run out.  He is in ongoing management for chronic pain and cancer.  He had a right arm DVT, for which he takes Eliquis, in September 2021 and plans on following up with vascular surgery, next week.  He denies shortness of breath, nausea, vomiting.  There are no other known modifying factors.    Past Medical History:  Diagnosis Date  . Cancer ([place])   . Pancreatitis 2009  . Panic attacks     Patient Active Problem List   Diagnosis Date Noted  . ANEMIA 03/10/2009    Past Surgical History:  Procedure Laterality Date  . COLONOSCOPY  2009  . KNEE SURGERY Left 1980       Family History  Problem Relation Age of Onset  . Cancer Mother        intestinal cancer  . Diabetes Father   . Kidney disease Father   . Cancer Father        lung cancer  . Diabetes Paternal Aunt     Social History   Tobacco Use  . Smoking status: Never Smoker  . Smokeless tobacco: Never Used  Vaping Use  . Vaping Use: Never used  Substance Use Topics  . Alcohol use: Not Currently    Alcohol/week: 25.0 standard drinks    Types: 25 Cans of beer per week  . Drug use: Yes    Types: Marijuana    Home Medications Prior to Admission medications   Medication Sig Start Date End Date Taking? Authorizing Provider  bisacodyl (DULCOLAX) 5 MG EC tablet Take 1 tablet (5 mg total) by mouth daily as needed for moderate constipation. 08/20/20  Yes Idol, Almyra Free, PA-C  ELIQUIS 5 MG TABS tablet Take 5 mg by mouth 2 (two) times daily. 08/06/20  Yes [provider]  EPINEPHrine 0.3 mg/0.3 mL IJ SOAJ injection Inject 0.3  mLs into the muscle daily as needed. 03/26/15  Yes [provider]  lisinopril (ZESTRIL) 20 MG tablet Take 1 tablet (20 mg total) by mouth daily. 12/24/19  Yes Soyla Dryer, PA-C  metFORMIN (GLUCOPHAGE-XR) 500 MG 24 hr tablet Take 500 mg by mouth every morning. 08/08/20  Yes [provider]  NARCAN 4 MG/0.1ML LIQD nasal spray kit Place 1 spray into the nose once.  09/04/20  Yes [provider]  OLANZapine (ZYPREXA) 5 MG tablet Take 1 tablet by mouth every evening. 08/12/20 08/12/21 Yes [provider]  oxyCODONE 10 MG TABS Take 1 tablet (10 mg total) by mouth every 8 (eight) hours as needed for severe pain. 08/11/20  Yes Derek Jack, MD  silver sulfADIAZINE (SILVADENE) 1 % cream Apply 1 application topically daily.   Yes [provider]  sodium fluoride (FLUORISHIELD) 1.1 % GEL dental gel Place onto teeth. 07/07/20  Yes [provider]  XTAMPZA ER 18 MG C12A Take 1 capsule by mouth every 12 (twelve) hours. 10/01/20  Yes [provider]  ACCU-CHEK GUIDE test strip USE 1 STRIP TO CHECK GLUCOSE THREE TIMES DAILY FOR SYMPTOMS OF HIGH OR LOW BLOOD SUGAR 07/31/20  [provider]  Blood Glucose Monitoring Suppl (ACCU-CHEK GUIDE ME) w/Device KIT See admin instructions. 07/31/20   [provider]  Lancets (FREESTYLE) lancets Test daily before all meals/snacks and once before bedtime. 07/31/20   [provider]  oxyCODONE-acetaminophen (PERCOCET) 10-325 MG tablet Take 1 tablet by mouth every 6 (six) hours as needed for pain. 10/28/20   Daleen Bo, MD  PERCOCET 5-325 MG tablet Take 1 tablet by mouth every 6 (six) hours as needed for severe pain. Patient not taking: Reported on 10/28/2020 07/13/20   Rolland Porter, MD  RELION PEN NEEDLE 31G/8MM 31G X 8 MM MISC USE 1 ONCE DAILY FOR INSULIN ADMINISTRATION 07/31/20   [provider]    Allergies    Bee venom, Aspirin, and Morphine and related  Review of Systems    Review of Systems  All other systems reviewed and are negative.   Physical Exam Updated Vital Signs BP (!) 143/80   Pulse 80   Temp 99.1 F (37.3 C) (Oral)   Resp 16   Ht '6\' 1"'  (1.854 m)   Wt 72.6 kg   SpO2 98%   BMI 21.11 kg/m   Physical Exam Vitals and nursing note reviewed.  Constitutional:      General: He is not in acute distress.    Appearance: He is well-developed. He is not ill-appearing, toxic-appearing or diaphoretic.  HENT:     Head: Normocephalic and atraumatic.     Right Ear: External ear normal.     Left Ear: External ear normal.  Eyes:     Conjunctiva/sclera: Conjunctivae normal.     Pupils: Pupils are equal, round, and reactive to light.  Neck:     Trachea: Phonation normal.  Cardiovascular:     Rate and Rhythm: Normal rate and regular rhythm.     Heart sounds: Normal heart sounds.  Pulmonary:     Effort: Pulmonary effort is normal.     Breath sounds: Normal breath sounds.  Chest:     Chest wall: Tenderness (Right anterior, mild, no deformity or crepitation) present.  Abdominal:     General: There is no distension.     Palpations: Abdomen is soft.     Tenderness: There is no abdominal tenderness.  Musculoskeletal:     Cervical back: Normal range of motion and neck supple.     Comments: Limited range of motion right elbow secondary to swelling which appears chronic, primarily medial.  Right medial forearm with postoperative changes with possible keloid formation.  Neurovascular tact distally in the right hand.  Skin:    General: Skin is warm and dry.  Neurological:     Mental Status: He is alert and oriented to person, place, and time.     Cranial Nerves: No cranial nerve deficit.     Sensory: No sensory deficit.     Motor: No abnormal muscle tone.     Coordination: Coordination normal.  Psychiatric:        Mood and Affect: Mood normal.        Behavior: Behavior normal.        Thought Content: Thought content normal.        Judgment: Judgment  normal.     ED Results / Procedures / Treatments   Labs (all labs ordered are listed, but only abnormal results are displayed) Labs Reviewed - No data to display  EKG None  Radiology DG Chest 2 View  Result Date: 10/28/2020 CLINICAL DATA:  Fall on Thursday.  Right-sided pain.  EXAM: CHEST - 2 VIEW COMPARISON:  None. FINDINGS: Small right pleural effusion. No focal consolidation. No discernible pneumothorax. Cardiomediastinal silhouette is within normal limits. Right IJ Port-A-Cath with the tip projecting at the cavoatrial junction. Suspected mildly displaced sixth rib fracture IMPRESSION: Suspected mildly displaced sixth rib fracture and small right pleural effusion. No visible pneumothorax. Electronically Signed   By: Margaretha Sheffield MD   On: 10/28/2020 15:12   DG Elbow Complete Right  Result Date: 10/28/2020 CLINICAL DATA:  Right elbow pain since fall on Thursday. EXAM: RIGHT ELBOW - COMPLETE 3+ VIEW COMPARISON:  None. FINDINGS: There is no evidence of fracture, dislocation, or joint effusion. Mild marginal spurring of the olecranon. Large triceps enthesophyte. Soft tissues are unremarkable. IMPRESSION: 1. No acute osseous abnormality. Electronically Signed   By: Titus Dubin M.D.   On: 10/28/2020 14:48    Procedures Procedures (including critical care time)  Medications Ordered in ED Medications  oxyCODONE-acetaminophen (PERCOCET/ROXICET) 5-325 MG per tablet 2 tablet (has no administration in time range)    ED Course  I have reviewed the triage vital signs and the nursing notes.  Pertinent labs & imaging results that were available during my care of the patient were reviewed by me and considered in my medical decision making (see chart for details).    MDM Rules/Calculators/A&P                           Patient Vitals for the past 24 hrs:  BP Temp Temp src Pulse Resp SpO2 Height Weight  10/28/20 1644 (!) 143/80 99.1 F (37.3 C) Oral 80 16 98 % -- --  10/28/20 1403  135/77 97.9 F (36.6 C) Oral 92 16 96 % '6\' 1"'  (1.854 m) 72.6 kg    At discharge- reevaluation with update and discussion. After initial assessment and treatment, an updated evaluation reveals no further complaints.  Findings discussed and questions answered. Daleen Bo   Medical Decision Making:  This patient is presenting for evaluation of injuries from fall, several days ago, which does require a range of treatment options, and is a complaint that involves a moderate risk of morbidity and mortality. The differential diagnoses include contusion, fracture, intrathoracic disorder. I decided to review old records, and in summary frail male, who is currently receiving chemotherapy, tripped and fell, injuring right arm and right chest.  I do not require additional historical information from anyone.   Radiologic Tests Ordered, included chest x-ray, right rib detail, right elbow x-ray.  I independently Visualized: Radiograph images, which show single rib fracture #6 right.  No elbow fracture.  No pneumothorax or intrathoracic injury.   Critical Interventions-clinical evaluation, radiography, observation reassessment  After These Interventions, the Patient was reevaluated and was found stable for discharge.  Patient has run out of his Percocet because he took some extra pills after the injury.  He states he takes this medication chronically.  He is due another prescription on 10/30/2020.  He has a chronic right swollen elbow and upper arm from prior DVT after surgical intervention of the right forearm.doubt worsening DVT right.  CRITICAL CARE-no Performed by: Daleen Bo  Nursing Notes Reviewed/ Care Coordinated Applicable Imaging Reviewed Interpretation of Laboratory Data incorporated into ED treatment  The patient appears reasonably screened and/or stabilized for discharge and I doubt any other medical condition or other [facility] requiring further screening, evaluation, or treatment in the ED  at this time prior to discharge.  Plan: Home Medications-continue usual,  short course of Percocet given since he is run out of his Percocet; Home Treatments-symptomatic care; return here if the recommended treatment, does not improve the symptoms; Recommended follow up-PCP or oncology, as needed     Final Clinical Impression(s) / ED Diagnoses Final diagnoses:  Closed fracture of one rib of right side, initial encounter  Contusion of right elbow, initial encounter  Swelling of joint of upper arm, right    Rx / DC Orders ED Discharge Orders         Ordered    oxyCODONE-acetaminophen (PERCOCET) 10-325 MG tablet  Every 6 hours PRN        10/28/20 2122           Daleen Bo, MD 10/28/20 2304

## 2020-10-28 NOTE — Unmapped (Signed)
Called pt to let him know Dr. Dorna Mai recommends pt be seen in ED to eval his arm. Pt states he will go.Will call back with any further needs.

## 2020-10-28 NOTE — Unmapped (Signed)
Attempted to return call. No answer or V/M available.

## 2020-10-28 NOTE — Unmapped (Signed)
Returned call to pt. 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 He had to break his fall with his right arm.States he is having alot of difficulty bending his right arm and having pain in his right shoulder. He also states he hit his head. Denies any loss of consciousness or lingering effects now.     He was calling today to see if team would order an MRI of his arm.Let him know he will need to have arm evaluated by his PCP, urgent care,or ED for any further follow up of injuries.States he will probably go to ED.Will call back with any further needs.

## 2020-10-28 NOTE — Unmapped (Signed)
Hi,     Edan contacted the Communication Center requesting to speak with the care team of Sharone Antwoine Zorn to discuss:    Patient called to see if he can get a MRI done locally.    Please contact Timur at [phone].        Check Indicates criteria has been reviewed and confirmed with the patient:    [x]  Preferred Name   [x]  DOB and/or MR#  [x]  Preferred Contact Method  [x]  Phone Number(s)   []  MyChart     Thank you,   Jacques Navy  [facility]   [phone]

## 2020-10-29 NOTE — Unmapped (Signed)
[facility] Refill Coordination Note    Specialty Medication(s) to be Shipped:   Hematology/Oncology: Greggory Keen    Other medication(s) to be shipped: No additional medications requested for fill at this time     Zyire Asahel Risden, DOB: 12-11-1960  Phone: [phone] (home)       All above HIPAA information was verified with patient.     Was a Nurse, learning disability used for this call? No    Completed refill call assessment today to schedule patient's medication shipment from the [facility] [phone]).       Specialty medication(s) and dose(s) confirmed: Regimen is correct and unchanged.   Changes to medications: Mason reports no changes at this time.  Changes to insurance: No  Questions for the pharmacist: No    Confirmed patient received Welcome Packet with first shipment. The patient will receive a drug information handout for each medication shipped and additional FDA Medication Guides as required.       DISEASE/MEDICATION-SPECIFIC INFORMATION        For patients on injectable medications: Patient currently has 0 doses left.  Next injection is scheduled for 11/05/2020.    SPECIALTY MEDICATION ADHERENCE     Medication Adherence    Patient reported X missed doses in the last month: 0  Specialty Medication: Udenyca 6mg /0.90mL  Patient is on additional specialty medications: No          SHIPPING     Shipping address confirmed in Epic.     Delivery Scheduled: Yes, Expected medication delivery date: 10/31/2020.     Medication will be delivered via UPS to the prescription address in Epic WAM.    Lorelei Pont [facility] Specialty Technician

## 2020-10-30 MED FILL — UDENYCA 6 MG/0.6 ML SUBCUTANEOUS SYRINGE: 21 days supply | Qty: 1 | Fill #3 | Status: AC

## 2020-10-30 MED FILL — UDENYCA 6 MG/0.6 ML SUBCUTANEOUS SYRINGE: 21 days supply | Qty: 0.6 | Fill #3

## 2020-11-03 ENCOUNTER — Ambulatory Visit: Admit: 2020-11-03 | Discharge: 2020-11-04 | Payer: PRIVATE HEALTH INSURANCE

## 2020-11-03 DIAGNOSIS — C7800 Secondary malignant neoplasm of unspecified lung: Principal | ICD-10-CM

## 2020-11-03 DIAGNOSIS — C499 Malignant neoplasm of connective and soft tissue, unspecified: Principal | ICD-10-CM

## 2020-11-03 DIAGNOSIS — C4911 Malignant neoplasm of connective and soft tissue of right upper limb, including shoulder: Principal | ICD-10-CM

## 2020-11-03 MED ADMIN — iohexoL (OMNIPAQUE) 350 mg iodine/mL solution 100 mL: 100 mL | INTRAVENOUS | @ 18:00:00 | Stop: 2020-11-03

## 2020-11-04 DIAGNOSIS — Z794 Long term (current) use of insulin: Principal | ICD-10-CM

## 2020-11-04 DIAGNOSIS — S2231XA Fracture of one rib, right side, initial encounter for closed fracture: Principal | ICD-10-CM

## 2020-11-04 DIAGNOSIS — R590 Localized enlarged lymph nodes: Principal | ICD-10-CM

## 2020-11-04 DIAGNOSIS — R1900 Intra-abdominal and pelvic swelling, mass and lump, unspecified site: Principal | ICD-10-CM

## 2020-11-04 DIAGNOSIS — C499 Malignant neoplasm of connective and soft tissue, unspecified: Principal | ICD-10-CM

## 2020-11-04 DIAGNOSIS — M545 Low back pain, unspecified: Principal | ICD-10-CM

## 2020-11-04 DIAGNOSIS — I890 Lymphedema, not elsewhere classified: Principal | ICD-10-CM

## 2020-11-04 DIAGNOSIS — C496 Malignant neoplasm of connective and soft tissue of trunk, unspecified: Principal | ICD-10-CM

## 2020-11-04 DIAGNOSIS — Z885 Allergy status to narcotic agent status: Principal | ICD-10-CM

## 2020-11-04 DIAGNOSIS — E109 Type 1 diabetes mellitus without complications: Principal | ICD-10-CM

## 2020-11-04 DIAGNOSIS — C799 Secondary malignant neoplasm of unspecified site: Principal | ICD-10-CM

## 2020-11-04 DIAGNOSIS — I10 Essential (primary) hypertension: Principal | ICD-10-CM

## 2020-11-04 DIAGNOSIS — Z79891 Long term (current) use of opiate analgesic: Principal | ICD-10-CM

## 2020-11-04 DIAGNOSIS — Z9221 Personal history of antineoplastic chemotherapy: Principal | ICD-10-CM

## 2020-11-04 DIAGNOSIS — M79601 Pain in right arm: Principal | ICD-10-CM

## 2020-11-04 DIAGNOSIS — F411 Generalized anxiety disorder: Principal | ICD-10-CM

## 2020-11-04 DIAGNOSIS — C7640 Malignant neoplasm of unspecified upper limb: Principal | ICD-10-CM

## 2020-11-04 DIAGNOSIS — Z7901 Long term (current) use of anticoagulants: Principal | ICD-10-CM

## 2020-11-04 DIAGNOSIS — G8929 Other chronic pain: Principal | ICD-10-CM

## 2020-11-04 DIAGNOSIS — Z86718 Personal history of other venous thrombosis and embolism: Principal | ICD-10-CM

## 2020-11-04 DIAGNOSIS — Z85831 Personal history of malignant neoplasm of soft tissue: Principal | ICD-10-CM

## 2020-11-04 DIAGNOSIS — C493 Malignant neoplasm of connective and soft tissue of thorax: Principal | ICD-10-CM

## 2020-11-04 DIAGNOSIS — G893 Neoplasm related pain (acute) (chronic): Principal | ICD-10-CM

## 2020-11-04 DIAGNOSIS — M79621 Pain in right upper arm: Principal | ICD-10-CM

## 2020-11-04 LAB — COMPREHENSIVE METABOLIC PANEL
ALBUMIN: 2.8 g/dL — ABNORMAL LOW (ref 3.4–5.0)
ALKALINE PHOSPHATASE: 286 U/L — ABNORMAL HIGH (ref 46–116)
ALT (SGPT): 26 U/L (ref 10–49)
ANION GAP: 8 mmol/L (ref 5–14)
AST (SGOT): 45 U/L — ABNORMAL HIGH (ref <=34)
BILIRUBIN TOTAL: 0.3 mg/dL (ref 0.3–1.2)
BLOOD UREA NITROGEN: 14 mg/dL (ref 9–23)
BUN / CREAT RATIO: 25
CALCIUM: 9.2 mg/dL (ref 8.7–10.4)
CHLORIDE: 105 mmol/L (ref 98–107)
CO2: 23 mmol/L (ref 20.0–31.0)
CREATININE: 0.55 mg/dL — ABNORMAL LOW
EGFR CKD-EPI AA MALE: >90 mL/min/{1.73_m2} (ref >=60)
EGFR CKD-EPI NON-AA MALE: >90 mL/min/{1.73_m2} (ref >=60)
GLUCOSE RANDOM: 154 mg/dL (ref 70–179)
POTASSIUM: 3.7 mmol/L (ref 3.4–4.5)
PROTEIN TOTAL: 6.9 g/dL (ref 5.7–8.2)
SODIUM: 136 mmol/L (ref 135–145)

## 2020-11-04 LAB — CBC W/ AUTO DIFF
BASOPHILS ABSOLUTE COUNT: 0.1 10*9/L (ref 0.0–0.1)
BASOPHILS RELATIVE PERCENT: 0.7 %
EOSINOPHILS ABSOLUTE COUNT: 0.1 10*9/L (ref 0.0–0.4)
EOSINOPHILS RELATIVE PERCENT: 0.7 %
HEMATOCRIT: 26.2 % — ABNORMAL LOW (ref 41.0–53.0)
HEMOGLOBIN: 8 g/dL — ABNORMAL LOW (ref 13.5–17.5)
LARGE UNSTAINED CELLS: 2 % (ref 0–4)
LYMPHOCYTES ABSOLUTE COUNT: 0.7 10*9/L — ABNORMAL LOW (ref 1.5–5.0)
LYMPHOCYTES RELATIVE PERCENT: 8.9 %
MEAN CORPUSCULAR HEMOGLOBIN CONC: 30.6 g/dL — ABNORMAL LOW (ref 31.0–37.0)
MEAN CORPUSCULAR HEMOGLOBIN: 35.1 pg — ABNORMAL HIGH (ref 26.0–34.0)
MEAN CORPUSCULAR VOLUME: 114.7 fL — ABNORMAL HIGH (ref 80.0–100.0)
MEAN PLATELET VOLUME: 9.5 fL (ref 7.0–10.0)
MONOCYTES ABSOLUTE COUNT: 0.5 10*9/L (ref 0.2–0.8)
MONOCYTES RELATIVE PERCENT: 7.3 %
NEUTROPHILS ABSOLUTE COUNT: 5.8 10*9/L (ref 2.0–7.5)
NEUTROPHILS RELATIVE PERCENT: 80.3 %
PLATELET COUNT: 421 10*9/L (ref 150–440)
RED BLOOD CELL COUNT: 2.29 10*12/L — ABNORMAL LOW (ref 4.50–5.90)
RED CELL DISTRIBUTION WIDTH: 17.4 % — ABNORMAL HIGH (ref 12.0–15.0)
WBC ADJUSTED: 7.2 10*9/L (ref 4.5–11.0)

## 2020-11-04 LAB — SLIDE REVIEW

## 2020-11-04 MED ADMIN — heparin, porcine (PF) 100 unit/mL injection 500 Units: 500 [IU] | INTRAVENOUS | @ 16:00:00 | Stop: 2020-11-05

## 2020-11-04 NOTE — Unmapped (Signed)
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 He has persistent RUE edema. 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    PLAN     1. Compression wrap today  2. Follow up with lymphedema clinic  3. Risk Factor Modification:  - Anticoagulation therapy - Eliquis 5 mg BID      HISTORY OF PRESENT ILLNESS     Ivan Banks is a 59 y.o. 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 Patient has completed XRT to BUE and is currently undergoing chemotherapy. He initially was diagnosed with RUE DVT in 07/2020, where he noted onset of RUE swelling after IV placement during admission at [facility] in early 07/2020. Venous duplex 08/06/20 showed RUE brachial DVT and he was started on Eliquis.  Patient then presented to the ED 09/10/20 with worsening RUE pain and swelling; patient also noted he was taking Eliquis 5 mg every day instead of BID. Imaging showed acute superficial venous obstruction to basilic vein, continued DVT to the brachial vein. Patient was instructed on proper Eliquis dosage.     Since then, he has persistent RUE edema. He said that elevating the arm at night used to help some, but it is no longer effective. He has found it very difficult to use his arm and cannot raise it much above 45 degrees due to pain and pulling on the medial aspect of the arm.      ROS: The balance of 10 systems reviewed is negative except as noted in the HPI     RX/ ALLERGIES/ MED HX/SURG HX/ SOC HX/FAM HX: Reviewed & updated in Epic    Current Outpatient Medications   Medication Instructions   ??? apixaban (ELIQUIS) 5 mg, Oral, 2 times a day (standard)   ??? blood sugar diagnostic (GLUCOSE BLOOD) Strp Use to check blood sugar as directed with insulin 3 times a day & for symptoms of high or low blood sugar.   ??? blood-glucose meter kit Use as instructed   ??? blood-glucose meter kit Use as instructed   ??? cholecalciferol (vitamin D3-10 mcg (400 unit)) 10 mcg, Oral, Daily before breakfast   ??? DULoxetine (CYMBALTA) 30 mg, Oral, Every morning   ??? EPINEPHrine (EPIPEN) 0.3 mg, Intramuscular, Once   ??? fluoride, sodium, (CLINPRO 5000) 1.1 % Pste Dental, Daily (standard), Brush on teeth twice daily, do not rinse, spit out excess leave on teeth for at least one hr before eating or drinking   ??? lancets (FREESTYLE) 28 gauge Misc Test daily before all meals/snacks and once before bedtime.   ??? lancets (FREESTYLE) 28 gauge Misc Test daily before all meals/snacks and once before bedtime.   ??? lancets Misc Use to check blood sugar as directed with insulin 3 times a day & for symptoms of high or low blood sugar.   ??? LANTUS SOLOSTAR U-100 INSULIN 15 Units, Subcutaneous, Daily (standard)   ??? lisinopriL (PRINIVIL,ZESTRIL) 20 mg, Oral, Daily (standard)   ??? metFORMIN (GLUCOPHAGE-XR) 1,000 mg, Oral, Daily   ??? naloxone [facility])  4 mg nasal spray One spray in either nostril once for known/suspected opioid overdose. May repeat every 2-3 minutes in alternating nostril til EMS arrives   ??? pen needle, diabetic 31 gauge x 1/4 (6 mm) Ndle Use for insulin administration daily   ??? polyethylene glycol (MIRALAX) 17 g, Oral, 2 times a day PRN   ??? silver sulfaDIAZINE (SILVADENE, SSD) 1 % cream Apply to affected area daily   ??? zinc gluconate 50 mg, Oral, Daily (standard)        PHYSICAL EXAM     Vitals:    11/04/20 1315   BP: 163/88   Temp: 36 ??C (96.8 ??F)     General: well appearing, no acute distress   Head: normocephalic, atraumatic  Neck: Supple  Cardiac: RRR   Lungs: Easy work of breathing, CTA Bilaterally  Abdomen: Soft, non-distended, non tender, without mass  MSK: negative joint tenderness, normal gait and ROM  Skin: intact, good turgor  Neuro: AAO x 3, appropriate to questions  Extremities: Upper extremities warm and perfused.  Post radiation skin changes on bilateral upper arms.  RUE with 2+ pitting edema throughout the extremity. Bilateral lower extremities negative for edema, ulceration or digital gangrene.   Pulses: Bilateral radial pulses palpable.     DIAGNOSTICS     Images and reports reviewed independently.    RUE venous duplex (09/10/2020) - Acute superficial venous obstruction in basilic vein, continued obstruction to brachial vein.       ______________________________________________________________________    Documentation assistance was provided by Marrianne Mood, Scribe, on November 03, 2020 at 4:30 PM for Galen Daft, [place], and Mitchel Honour, MD    I attest that I have reviewed thenote and that the components of the history of the present illness, the physical exam, and the assessment and plan documented were performed by me or were performed in my presence and I verified the documentation and performed (or re-performed) the exam and medical decision making.    Windell Hummingbird, MD  General Surgery, PGY1    I saw and evaluated the patient, participating in the key portions of the service.?? I reviewed the resident???s note.?? I agree with the resident???s findings and plan. Tomasa Hose, MD

## 2020-11-04 NOTE — Unmapped (Signed)
OUTPATIENT ONCOLOGY PALLIATIVE CARE    Principal Diagnosis: [name] is a 59 y.o. 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     Assessment/Plan:   1. Right axilla and right arm pain due to sarcomas-appears to have a neuropathic component to his pain with a hx of left lower back pain.  Mild right chest pain due to fractured ribs from recent fall.  -Continue Xtampza ER 18 mg twice daily. Next scripts due for refill on 11/28/20  -Continue oxycodone 10 mg every every 6 hours as needed.  -Start Cymbalta 30 mg every morning. (Still has not picked up from the pharmacy).       2.  Goals of care/ACP  -Today's scan with disease progression: Multiple metastatic lesions throughout the abdomen and pelvis with associated lymphadenopathy overall increased compared to prior.  Stable disease in chest.  Jan Fireman commenting that this is a hurdle and will be changing his therapy.    At prior visits: Verbalizing some frustration over the limited capacity he has with his right arm.  Shared a year ago he was so active in the work that he has done in the past and the following day he was diagnosed with cancer and everything changed so abruptly.  Shared that he does have moments of being short tempered with his girlfriend at times.  Finds the support with the [facility] is helpful.   -Shared with [name] that I am unsure if he is ever going to have the full capacity of his right arm like he had in the past.  -Hoping to connect with an MSW in his community to assess for additional support in his community.  If not, will work with the [employer] social work team.  -Encouraged him to do things that he finds enjoyable such as attend [employer] football games.  Has received the Covid vaccine.  -Will connect with Dr. Dorna Mai to discuss possibility of stent per clinic notes and [name]'s comment that he remembers this being a possible treatment option during his stay. Notes indicate: Vascular surgery consult for possible stenting of severe luminal narrowing of the right brachiocephalic vein given patient's right arm pain        Health Care Decision Maker    HCDM, First Alternate: [name] - Domestic Partner - [phone]    HCDM, Second Alternate: Dorwin, Fitzhenry - Brother - [phone]      # Controlled substances risk management.   - Patient has a signed pain medication agreement with Outpatient Palliative Care, completed on 09/02/20, as per standard of care.   - [place]CCS[place] database was reviewed today and it was appropriate.   - Urine drug screen was performed at this visit. Findings: appropriate. + for marijuana which [name] shared that uses.   - Patient has received information about safe storage and administration of medications.   - Patient has received a prescription for narcan.      F/u: 4 wks during infusion 1/11 infusion    ----------------------------------------  Referring Provider: Dr. Dorna Mai  Oncology Team: Dr. 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 Had palliative XRT to L forearm in 11/2019.  07/31/2020 hospitalized and diagnosed with new onset type 1 diabetes mellitus. 07/2020 with Occlusive thrombus seen  within the mid right brachial vein. Onc notes on 07/29/20: Recommended discontinuing olaparib and pursuing doxorubicin as second line therapy. 07/25/20 scans showing decrease in size of pulmonary metastasis and worsening nodal and left posterior chest wall metastasis. PERITONEUM/RETROPERITONEUM AND MESENTERY/SOFT TISSUES with numerous metastatic deposits are again noted. Paraesophageal measures 5.1 x 2.8 cm, Left posterior pelvic mass measuring 5.2 x 4.2 cm and left iliac mass measures 6.5 x 6.2 cm.    Primary pain is left lower back that has been occurring in the last 1 and half months. Describes it as a stabbing sensation with needle sticking in his back.  Pain intensity is 8-10/10.  Followed by his right axilla and describes this as a burning sensation.  Has been taking Xtampza ER 9 mg twice a day, oxycodone 5 mg about 4 tablets/day and ibuprofen about 6 tablets this past week.  Reports that the oxycodone did work when he initially started it but does not feel like it is helping that much now.  Feels that the ibuprofen is beneficial.  Recently started Eliquis for thrombosis in right arm. Also appreciating a decreased range of motion in his right arm and it remained swollen.    Energy improved after receiving the blood transfusion.  Is independent with ADLs except needs help with dressing due to the limited range of motion with his right arm.  Appetite is slowly improving and weight is currently 158 pounds.  At one point he was down to 140 pounds.  Baseline weight is 165 pounds.  No issues with nausea.  Taking olanzapine for days after chemotherapy.  Bowel movements are every 2 to 3 days and using Dulcolax.    Mood has been good.  Notes indicate alcohol and benzodiazepine use. [name] did endorse that when he took the benzodiazepine and things were prescription meds given to him.  He is no longer drinking alcohol after having had the radiation which is altered his taste for alcohol.  Does smoke marijuana.      Current cancer-directed therapy: Pembro    Interval hx 10/14/20 CK and Vickki Muff that the pain in his right axilla and right upper arm continues. Feels like the swelling in his arm is enlarging and even went to the ED department last week because he was concerned about the swelling.  Reports that they are days that he needs the oxycodone 10 mg 4 times a day and not 3 times a day as ordered to bring the pain to a tolerable range.  Continues to take the xtampza 18 mg twice a day.  Describes the pain as a pulling sensation that radiates down to his elbow.  Has some needlelike pain in his thumb.  His back pain is eased off.  He was not able to tolerate the gabapentin produced dizziness.  Comments that the nodules in his leg and arm feels smaller.     Interval hx 11/04/20 CK and [name]    Today's scan with disease progression: Multiple metastatic lesions throughout the abdomen and pelvis with associated lymphadenopathy overall increased compared to prior.  Status post fall on 12/7, tripped in the grass at his home and seen in the local ED.  Larey Seat on his right arm and has a fractured rib with contusion of right elbow.  Appreciating more pain at nighttime.  Using xtampza 18 mg twice daily and oxycodone 10 mg 4 tablets/day.  Has not picked up the Cymbalta.  Some discomfort in the right rib cage due to the fracture.  Low level abdominal discomfort.      Symptom Review:  General: Okay  Pain: See interval history  Fatigue: Describes as fair.  His girlfriend helps him with his bathing and showering.  Sleep: Appreciating more pain at bedtime  Appetite: Okay  Nausea: Not assessed  Bowel function: Every day to every other day  Dyspnea: Appreciating more shortness of breath with activity, none at rest.  Mood: Fair, comments that when he fell he was disappointed.      Palliative Performance Scale: 60% - Ambulation: Reduced / unable to do hobby or some housework, significant disease / Self-Care:Occasional assist as necessary / Intake:Normal or reduced / Level of Conscious: Full or confusion      Coping/Support Issues: Has good support with his family and friends.  He lives with his girlfriend Waynetta Sandy and his brother Lenard Galloway is very supportive.  He does not have children.  He was in accompanied by his friend to him at the initial visit.  Shares that he has good neighbors and his brother-in-law to help him.    Goals of Care: To beat at the cancer.  Short-term goals of completing his tiny house that he has been working on the last couple years.    Social History:   Name of primary support: Beth and Lenard Galloway  Occupation: Did work as a Engineer, structural and quit in 2014.  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  Hobbies: Enjoys working on his tiny house. Enjoys yard Airline pilot.  Current residence / distance from [facility]: 1 hour and 20 minutes away.    Advance Care Planning:   HCPOA:  Natural surrogate decision maker: Lenard Galloway his brother and Waynetta Sandy his girlfriend.  Living Will:  ACP note:     Objective     Opioid Risk Tool:     Male  Male    Family history of substance abuse      Alcohol  1  3    Illegal drugs  2  3    Rx drugs  4  4    Personal history of substance abuse      Alcohol  3  3    Illegal drugs  4  4    Rx drugs  5  5    Age between 61???45 years  1  1    History of preadolescent sexual abuse  3  0    Psychological disease      ADD, OCD, bipolar, schizophrenia  2  2    Depression  1  1       Total: 3  (<3 low risk, 4-7 moderate risk, >8 high risk)    Oncology History   Sarcoma (CMS-HCC)   07/10/2019 Initial Diagnosis    Sarcoma (CMS-HCC)     05/14/2020 - 06/24/2020 Chemotherapy    STUDY [id number] IRB# [id number] OLAPARIB (v. 07/31/19)  A Phase 2 Study of PARP Inhibitor Olaparib ([id number]) in IDH1 and IDH2 Mutant Advanced Solid Tumors     08/12/2020 -  Chemotherapy    OP SARCOMA DOXORUBICIN  DOXOrubicin 75 mg/m2 IV on day 1, every 21 days     11/11/2020 -  Chemotherapy    OP LUNG PEMBROLIZUMAB  pembrolizumab 200 mg IV on day 1, every 21 days         Patient Active Problem List   Diagnosis   ??? Anemia   ??? Cervical neck pain with evidence of disc disease   ???  Left shoulder pain   ??? Sarcoma (CMS-HCC)   ??? Insomnia due to mental disorder   ??? PTSD (post-traumatic stress disorder)   ??? GAD (generalized anxiety disorder)   ??? Alcohol use disorder, moderate, dependence (CMS-HCC)   ??? Right arm pain   ??? Hyperosmolar hyperglycemic state (HHS) (CMS-HCC)   ??? History of sarcoma   ??? Acute deep vein thrombosis (DVT) of brachial vein of right upper extremity (CMS-HCC)   ??? Other chronic pain   ??? Diabetes mellitus (CMS-HCC)   ??? Ankle edema, bilateral   ??? Type 2 diabetes mellitus without complication, with long-term current use of insulin (CMS-HCC)   ??? Cancer associated pain       Past Medical History:   Diagnosis Date   ??? Anemia    ??? Anxiety    ??? Cancer (CMS-HCC)    ??? Chronic back pain    ??? Diabetes mellitus (CMS-HCC) 08/08/2020   ??? Hypertension    ??? Substance abuse (CMS-HCC)        Past Surgical History:   Procedure Laterality Date   ??? IR INSERT PORT AGE GREATER THAN 5 YRS  08/12/2020    IR INSERT PORT AGE GREATER THAN 5 YRS 08/12/2020 Jobe Gibbon, MD IMG VIR [facility]   ??? KNEE ARTHROSCOPY Left 1990       Current Outpatient Medications   Medication Sig Dispense Refill   ??? apixaban (ELIQUIS) 5 mg Tab Take 1 tablet (5 mg total) by mouth Two (2) times a day. 180 tablet 3   ??? blood sugar diagnostic (GLUCOSE BLOOD) Strp Use to check blood sugar as directed with insulin 3 times a day & for symptoms of high or low blood sugar. 100 strip 3   ??? blood-glucose meter kit Use as instructed 1 each 0   ??? blood-glucose meter kit Use as instructed 1 each 0   ??? DULoxetine (CYMBALTA) 30 MG capsule Take 1 capsule (30 mg total) by mouth every morning. 30 capsule 0   ??? EPINEPHrine (EPIPEN) 0.3 mg/0.3 mL injection Inject 0.3 mL (0.3 mg total) into the muscle once for 1 dose. 0.3 mL 0   ??? fluoride, sodium, (CLINPRO 5000) 1.1 % Pste Apply to teeth daily. Brush on teeth twice daily, do not rinse, spit out excess leave on teeth for at least one hr before eating or drinking 100 mL 5   ??? insulin glargine (LANTUS SOLOSTAR U-100 INSULIN) 100 unit/mL (3 mL) injection pen Inject 0.15 mL (15 Units total) under the skin daily. 4.5 mL 0   ??? lancets (FREESTYLE) 28 gauge Misc Test daily before all meals/snacks and once before bedtime. 100 each 0   ??? lancets (FREESTYLE) 28 gauge Misc Test daily before all meals/snacks and once before bedtime. 3 each 0   ??? lancets Misc Use to check blood sugar as directed with insulin 3 times a day & for symptoms of high or low blood sugar. 100 each 0   ??? lisinopriL (PRINIVIL,ZESTRIL) 20 MG tablet Take 1 tablet (20 mg total) by mouth daily. 90 tablet 2   ??? metFORMIN (GLUCOPHAGE-XR) 500 MG 24 hr tablet Take 2 tablets (1,000 mg total) by mouth daily with evening meal. 90 tablet 3   ??? naloxone (NARCAN) 4 mg nasal spray One spray in either nostril once for known/suspected opioid overdose. May repeat every 2-3 minutes in alternating nostril til EMS arrives 2 each 0   ??? OLANZapine (ZYPREXA) 5 MG tablet Take 1 tablet (5 mg total) by mouth nightly.  Take for the first 4 days after chemotherapy 4 tablet 5   ??? ondansetron (ZOFRAN) 8 MG tablet Take 1 tablet (8 mg total) by mouth every eight (8) hours as needed for nausea (or vomiting). 30 tablet 2   ??? oxyCODONE (ROXICODONE) 10 mg immediate release tablet Take 1 tablet (10 mg total) by mouth every six (6) hours as needed for pain. Max of 4 tabs/day 120 tablet 0   ??? oxyCODONE myristate (XTAMPZA ER) 18 mg CSpT Take 18 mg by mouth every twelve (12) hours. 60 each 0   ??? pegfilgrastim-cbqv (UDENYCA) 6 mg/0.6 mL injection Inject the contents of 1 syringe (0.59mL) under the skin once every 21 days. Inject 24-72 hours after last chemotherapy dose on day 1. 0.6 mL 4   ??? pen needle, diabetic 31 gauge x 1/4 (6 mm) Ndle Use for insulin administration daily 100 each 0   ??? polyethylene glycol (MIRALAX) 17 gram/dose powder Take 17 g by mouth two (2) times a day as needed (constipation). 850 g 0   ??? prochlorperazine (COMPAZINE) 10 MG tablet Take 1 tablet (10 mg total) by mouth every six (6) hours as needed (nausea or vomitting). 30 tablet 2   ??? silver sulfaDIAZINE (SILVADENE, SSD) 1 % cream Apply to affected area daily 50 g 3     No current facility-administered medications for this encounter.     Facility-Administered Medications Ordered in Other Encounters   Medication Dose Route Frequency Provider Last Rate Last Admin   ??? heparin, porcine (PF) 100 unit/mL injection 500 Units  500 Units Intravenous Q30 Min PRN [name], MD 500 Units at 11/04/20 1033       Allergies:   Allergies   Allergen Reactions   ??? Venom-Honey Bee Anaphylaxis   ??? Aspirin Other (See Comments)     Stomach pain   ??? Gabapentin Dizziness   ??? Morphine Itching       Family History:  Cancer-related family history includes Cancer in his mother.  He indicated that his mother is deceased. He indicated that his father is deceased. He indicated that his sister is alive. He indicated that his brother is alive.      REVIEW OF SYSTEMS:  A comprehensive review of 10 systems was negative except for pertinent positives noted in HPI.    Lab Results   Component Value Date    CREATININE 0.55 (L) 11/04/2020     Lab Results   Component Value Date    ALKPHOS 286 (H) 11/04/2020    BILITOT 0.3 11/04/2020    PROT 6.9 11/04/2020    ALBUMIN 2.8 (L) 11/04/2020    ALT 26 11/04/2020    AST 45 (H) 11/04/2020            Pam Drown, FNP-BC, [facility]  Outpatient Oncology Palliative Care Service  [facility]  [address] [place], [place] [address]  [phone]           I spent 13 minutes on the phone with the patient on the date of service. I spent an additional 10  minutes on pre- and post-visit activities on the date of service.     The patient was physically located in [place] or a state in which I am permitted to provide care. The patient and/or parent/guardian understood that s/he may incur co-pays and cost sharing, and agreed to the telemedicine visit. The visit was reasonable and appropriate under the circumstances given the patient's presentation at the time.    The  patient and/or parent/guardian has been advised of the potential risks and limitations of this mode of treatment (including, but not limited to, the absence of in-person examination) and has agreed to be treated using telemedicine. The patient's/patient's family's questions regarding telemedicine have been answered.     If the visit was completed in an ambulatory setting, the patient and/or parent/guardian has also been advised to contact their provider???s office for worsening conditions, and seek emergency medical treatment and/or call 911 if the patient deems either necessary.

## 2020-11-04 NOTE — Unmapped (Signed)
PLAN FROM TODAY:    ?? We discussed starting a drug called pembrolizumab next week, this is an immune therapy that can help your own immune system attack the cacner  ?? Come back and see [place] in clinic in 4 weeks to see how treatment is going and do your next treatment      ?? Support for cancer patients and their caregivers during the COVID-19 pandemic [email or web address]  ?? Keeping cancer patients safe during the COVID-19 crisis [email or web address]-crisis/  +++++++++++++++++++++++++++++++++++++++++++++++++++++++++++  Your nurse navigator is:  Roseanne Reno, RN, BSN, OCN   Head & Neck/Sarcoma  Oncology Nurse Navigator  [email or web address]    For appointments & questions Monday through Friday 8 AM??? 5 PM   please call [phone] or Toll free [phone].    On Nights, Weekends and Holidays  Call [phone] and ask for the adult hematologist-oncologist on call.    [facility]  [address]  [place], [place] [address]  [email or web address]  Tel: [phone] / Toll Free [phone]  Fax: [phone]  +++++++++++++++++++++++++++++++++++++++++++++++++++++++++++

## 2020-11-04 NOTE — Unmapped (Signed)
6578 -  Blood drawn from [facility] by [facility], LPN.  Specimens sent to lab for analysis.

## 2020-11-04 NOTE — Unmapped (Signed)
Patient port flushed Push/pause with 2 NS and 1 heparin flush and deaccessed per protocol without complications. Patient tolerated well. Applied gauze and a bandaid.

## 2020-11-04 NOTE — Unmapped (Signed)
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  I reviewed the [facility] Sarcoma Targeted Gene Fusion Panel results for this patient, which did not note fusions in NR4A3 (extraskeletal myxoid chondrosarcoma) or HEY1-NCOA2 (mesenchymal chondrosarcoma).  Patient enrolled on  [id number] olaparib trial for his IDH-mutated disease but taken off-study after 2 cycles of olaparib due to mixed response / progression on imaging. Now on doxorubicin as second-line therapy with stable to mildly improved disease on imaging after 2 cycles.    Today we discussed his imaging showing mild progression in the abdomen and increasing effusion in the thorax.  He does not have new symptoms from this progression, but the increasing right effusion is worrisome and may become rapidly symptomatic if it continues to grow.  We discussed pursuing immunotherapy in the third-line.  Normally would consider further chemothearpy in this setting but, given his uncertain histology (spindle cell neoplasm), moleculars somewhat suspicious for (but not diagnostic of) chondrosarcoma, and high RNA PD-1 expression from STRATA testing, we discussed immunotherapy with pembrolizumab.  Counseled regarding immune related adverse events including rare but potentially life-threatening events including pericarditis, encephalitis, and hepatitis, as well as more common reactions such as rash, colitis, and pneumonitis.  Patient knows to call if any issues for any new or concerning symptoms.  He will return next week for C1D1 pembrolizumab.    If he recovers well after doxorubicin chemotherapy, would consider single-agent ifosfamide or pazopanib/cabozantinib off label as per chondrosarcomas.    - RTC 1 week for pembrolizumab    Pain:   Much improved on long and short acting opiate regimen.  Follows with [facility] palliative care      Right brachial vein DVT:    PVL ultrasound 08/06/2020 revealed occlusive thrombus in right mid brachial vein.  Patient reports having an IV at the site of the swelling that infiltrated during hospitalization for elevated glucose levels, and that pain and swelling (both at immediate IV site as well as distal arm) started while IV was in place.  Risk factors are active malignancy.  We again discussed this may be catheter-related and, while evidence supports continuing anticoagulation long-term for lower extremity DVTs given pro-thrombotic malignancy, we could reconsider this in the future depending on the clinical circumstances (e.g. how well he tolerates therapy, cost, tumor burden).  He confirms is taking 5mg  BID as directed.  We previously discussed evaluation by vascular surgery given ongoing swelling despite therapeutic anticoagulation.  - Continue apixabin 5mg  BID  - Vascular surgery evaluation upcoming    I personally reviewed the medical records, pathology and laboratory results and viewed the imaging. All questions were answered to the patient's satisfaction and they voiced understanding and agreement with the plan. Pt has my card and contact information and is encouraged to reach out with any further questions.      REASON FOR CONSULTATION:  Ivan Banks is a 59 y.o. male who is seen in consultation at the request of [name], MD for evaluation of his soft tissue sarcoma.      HISTORY OF PRESENT ILLNESS:      Since his last visit he had a fall at home injuring his right arm and head, presented to local ED where he was diagnosed with a #6 right rib fracture based on plain films of the chest and elbow.  Today, reports feeling ok overall.  Has moderate energy levels and continues to care for his goats at his home.  Last cycle of doxorubicin was fatiguing, feels that he is back to his recent baseline today.  Denies nausea, oral pain, diarrhea.  Appetite stable      Oncologic timeline:  - 2015-2016: Patient first notes small nodule in middle of RIGHT anterior forearm, nonpainful  - 06/12/19: Meets with local general surgeon, CT of RUE ordered  - 06/15/19: CT Chest w contrast: Right axillary lymphadenopathy largest 3.7cm, multiple solid/subsolid  Pulmonary nodules bilaterally with largest 8.6mm over right lower lobe.  - 06/25/19: US guided biopsy of R forearm and axillary masses.  Pathology revealed a epitheloid / undifferentiated malignancy suspicious for an undifferentiated sarcoma, IHC (+) Vimentin, ema, CD99 (-) CK7, CK20, OSCAR, S100, HMB-45, Desmin, smooth muscle actin, SOX-10, CEA, Calponin, cytokeratin, CD68, WT1.  In addition, there was equivocal or focal staining for p63.  Staining for INI1 showed nuclear localization (normal result).   - Sept 2020: palliative RT to the right forearm (55Gy/23fx) and R axilla (48Gy/33fx) finished 08/22/2019  - 08/23/2019: STRATA molecular testing: IDH1 pR132C, MS:S, TMB 78mut/Mb (low), also detected variants of uncertain significance: MLL3 M1242K (53% VAF), TAF1L K914E (5% VAF), TPR Q1063* (9% VAF).  PD-L1 by RNA expression was 49 (scale 0-100) with estimated 20% tumor content.  - 10/30/19: MRI LEFT forearm: 6.5 x 3.1 x 3.8 cm mass in left flexor compartment, likely focus of metastatic disease  - 12/19/19: Completes palliative XRT to LEFT forearm lesion, 5500cGy with Dr Rayetta Humphrey  - 01/01/20: CT Chest wo contrast: Decrease in right axillary lymphadenopathy, two new right lower lobe and left upper lobe 0.7 cm pulmonary nodules, increased size of left paratracheal node, and increased size of right subpectoral node compatible with metastatic disease  - 04/01/20: CT chest wo contrast: Interval increased size of pulmonary metastasis and multi compartmental mediastinal and right axillary nodal metastasis consistent with disease progression.  Interval prominence of upper abdominal lymphadenopathy, concerning for metastatic disease. Recommend CT abdomen and pelvis for better characterization.  - 05/06/20: CT Chest wo contrast: Increased burden of metastatic disease including increased size of multiple pulmonary nodules, axillary, retrocrural, mediastinal and gastrohepatic lymphadenopathy. Additionally there are peritoneal masses concerning for metastatic disease.  - 05/14/20: C1D1 olaparib per [id number] (300mg  po BID)  -06/10/20: Interruption in start of C2 due to anemia (14.2 -> 8.3) with C1 olaparib, recheck H&H in 2 weeks  - 06/24/20: Start of C2 olaparib, DR to 250mg  BID  - 07/25/20: CT Chest/Abdomen/Pelvis w contrast: Mixed response, mostly progression, with decrease size of pulmonary nodules and increase in mediastinal, abdominal and pelvic nodal and soft tissue implants.  - 08/12/20: C1 doxorubicin 75mg /m2, tolerated well  - 09/02/20: C2 doxorubicin 75mg /m2, tolerated well  - 09/11/20: Admitted [facility] for RUE pain and swelling, was restarted on apixaban lead-in after discovered he was taking 5mg  once daily  - 09/11/20: CT Chest w contrast: Stable axillary nodes and pulmonary nodules  - 09/23/20: CT  C/A/P w contrast: Mixed response with stability in the thorax and some abdominal lesions, with mild to minimal progression in several abdominal lesions.  Mild progression may be due to interval between baseline imaging (07/25/20) and start of C1 doxorubicin (08/12/20)  - 09/24/20: C3 doxorubicin 75mg /m2, tolerated well  - 10/14/20: C4 doxorubicin 75mg /m2, tolerated reasonably well with increasing fatigue  - 11/03/20: CT C/A/P with mixed mild decrease to mild increase in several abdominal lesions and enlarging small right pleural effusion      REVIEW OF SYSTEMS:  A comprehensive review of 12 systems was negative except for pertinent positives noted in HPI.    Past Medical History:   Diagnosis Date   ??? Anemia    ??? Anxiety    ??? Cancer (CMS-HCC)    ??? Chronic back pain    ??? Diabetes mellitus (CMS-HCC) 08/08/2020   ??? Hypertension    ??? Substance abuse (CMS-HCC)    - Pancreatitis 2009  - Last colonoscopy 2009       Past Surgical History:   Procedure Laterality Date   ??? IR INSERT PORT AGE GREATER THAN 5 YRS  08/12/2020    IR INSERT PORT AGE GREATER THAN 5 YRS 08/12/2020 Jobe Gibbon, MD IMG VIR [facility]   ??? KNEE ARTHROSCOPY Left 1990        Family History   Problem Relation Age of Onset   ??? Cancer Mother    ??? Diabetes Father    ??? Kidney disease Father    ??? Diabetes Sister    ??? Lupus Sister    ??? No Known Problems Brother         Social History     Occupational History   ??? Occupation: Boilermaker   ??? Occupation: Nurse, adult   ??? Occupation: Disability   Tobacco Use   ??? Smoking status: Never Smoker   ??? Smokeless tobacco: Never Used   Vaping Use   ??? Vaping Use: Never used   Substance and Sexual Activity   ??? Alcohol use: Not Currently   ??? Drug use: Yes     Frequency: 21.0 times per week     Types: Marijuana, Benzodiazepines     Comment: Smokes 2-3x a day   ??? Sexual activity: Yes   - Works on a horse farm  - Previously had routine cannabis use, stopped in order to enroll in clinical trial at [facility] in summer 2021      ALLERGIES/MEDICATIONS:  Reviewed in EPIC    PHYSICAL EXAM:   Vitals: BP 148/80  - Pulse 80  - Temp 36.8 ??C (98.3 ??F) (Temporal)  - Resp 16  - Ht 185.5 cm (6' 1.05)  - Wt 76.1 kg (167 lb 11.2 oz)  - SpO2 100%  - BMI 22.10 kg/m??   Gen: Comfortable, fatigued appearing, NAD. ECOG PS 1  Eyes: EOMI, sclera anicteric  ENT:  MMM.   Neck: Supple, trachea midline  Lymph: No cervical, submandibular, or supraclavicular lymphadenopathy.  There is a palpable firm fixed nodule in the deep right axilla, mildly tender today  CV: RRR no murmurs   Lungs: CTAB w/o rales, rhochi, or wheezing, good respiratory effort   GI: +BS, Soft, NT, ND, no masses appreciated   Ext: Warm and well perfused. No cyanosis or clubbing, moderate edema of right arm up to the elbow.  Fingers are warm with normal cap refill, reduced grip on right due to swelling  Skin: No rashes. 2x5cm darkened macula on right forearm from radiation  treatment, left forearm just distal to elbow an area of darkened skin.  No discharge, erythema or pain around the site.    Neuro: Moving all extremities, speech fluid.  Psych: mood euthymic and affect appropriate   MSK: No focal bony deformaties or tenderness    +++++++++++++++++++++++++++++++++++++++++++++++++++++++++++    PATHOLOGY:   Pathology was personally reviewed as described in the HPI, detailed in EPIC.  06/25/2019 Biopsy:  Final Diagnosis   A: Axilla, right, biopsy  - Metastatic, epithelioid/undifferentiated malignancy.  - See diagnostic comment.  ??  B: Forearm, right, biopsy  - Malignant epithelioid malignancy.  - See diagnostic comment.  ??  This electronic signature is attestation that the pathologist personally reviewed the submitted material(s) and the final diagnosis reflects that evaluation.   Electronically signed by Meyer Cory, MD on 07/04/2019 at 1511   Comment    Immunohistochemical staining was performed on this material with the following results: (+) Vimentin, ema, CD99 (-) CK7, CK20, OSCAR, S100, HMB-45, Desmin, smooth muscle actin, SOX-10, CEA, Calponin, cytokeratin, CD68, WT1.  In addition, there was equivocal or focal staining for p63.  Staining for INI1 showed nuclear localization (normal result).  ??  This material was shown in consultation to Dr. Eyvonne Left and Dr. Linton Rump of Dermatopathology.  ??  Overall, this represents a epithelioid malignancy, most likely a sarcoma, of unknown differentiation.  Material will be sent for detection of gene fusions in an effort to further classify.  A supplemental report will follow.     Addendum   Results from the Sarcoma Targeted Gene Fusion Panel (SARCP) performed at [facility]:  ??  No reportable fusions identified in 138 targeted genes.         LABS:  Reviewed in EPIC      RADIOLOGY:  Imaging was personally viewed as summarized in the history (see above).  I agree with the findings/interpretation; details in EPIC.

## 2020-11-05 ENCOUNTER — Ambulatory Visit: Admit: 2020-11-05 | Discharge: 2020-11-05 | Payer: PRIVATE HEALTH INSURANCE

## 2020-11-05 ENCOUNTER — Ambulatory Visit
Admit: 2020-11-05 | Discharge: 2020-11-05 | Payer: PRIVATE HEALTH INSURANCE | Attending: Vascular Surgery | Primary: Vascular Surgery

## 2020-11-05 ENCOUNTER — Other Ambulatory Visit: Admit: 2020-11-05 | Discharge: 2020-11-05 | Payer: PRIVATE HEALTH INSURANCE

## 2020-11-05 ENCOUNTER — Ambulatory Visit
Admit: 2020-11-05 | Discharge: 2020-11-05 | Payer: PRIVATE HEALTH INSURANCE | Attending: Student in an Organized Health Care Education/Training Program | Primary: Student in an Organized Health Care Education/Training Program

## 2020-11-11 ENCOUNTER — Other Ambulatory Visit: Admit: 2020-11-11 | Discharge: 2020-11-12 | Payer: PRIVATE HEALTH INSURANCE

## 2020-11-11 ENCOUNTER — Ambulatory Visit: Admit: 2020-11-11 | Discharge: 2020-11-12 | Payer: PRIVATE HEALTH INSURANCE

## 2020-11-11 DIAGNOSIS — Z85831 Personal history of malignant neoplasm of soft tissue: Principal | ICD-10-CM

## 2020-11-11 DIAGNOSIS — Z5112 Encounter for antineoplastic immunotherapy: Principal | ICD-10-CM

## 2020-11-11 DIAGNOSIS — C499 Malignant neoplasm of connective and soft tissue, unspecified: Principal | ICD-10-CM

## 2020-11-11 LAB — COMPREHENSIVE METABOLIC PANEL
ALBUMIN: 2.8 g/dL — ABNORMAL LOW (ref 3.4–5.0)
ALKALINE PHOSPHATASE: 466 U/L — ABNORMAL HIGH (ref 46–116)
ALT (SGPT): 52 U/L — ABNORMAL HIGH (ref 10–49)
ANION GAP: 5 mmol/L (ref 5–14)
AST (SGOT): 88 U/L — ABNORMAL HIGH (ref <=34)
BILIRUBIN TOTAL: 1.9 mg/dL — ABNORMAL HIGH (ref 0.3–1.2)
BLOOD UREA NITROGEN: 13 mg/dL (ref 9–23)
BUN / CREAT RATIO: 24
CALCIUM: 9.6 mg/dL (ref 8.7–10.4)
CHLORIDE: 103 mmol/L (ref 98–107)
CO2: 24 mmol/L (ref 20.0–31.0)
CREATININE: 0.54 mg/dL — ABNORMAL LOW
EGFR CKD-EPI AA MALE: >90 mL/min/{1.73_m2} (ref >=60)
EGFR CKD-EPI NON-AA MALE: >90 mL/min/{1.73_m2} (ref >=60)
GLUCOSE RANDOM: 135 mg/dL (ref 70–179)
POTASSIUM: 3.7 mmol/L (ref 3.4–4.5)
PROTEIN TOTAL: 7.1 g/dL (ref 5.7–8.2)
SODIUM: 132 mmol/L — ABNORMAL LOW (ref 135–145)

## 2020-11-11 LAB — CBC W/ AUTO DIFF
BASOPHILS ABSOLUTE COUNT: 0.1 10*9/L (ref 0.0–0.1)
BASOPHILS RELATIVE PERCENT: 0.6 %
EOSINOPHILS ABSOLUTE COUNT: 0.1 10*9/L (ref 0.0–0.4)
EOSINOPHILS RELATIVE PERCENT: 1.1 %
HEMATOCRIT: 28.8 % — ABNORMAL LOW (ref 41.0–53.0)
HEMOGLOBIN: 8.9 g/dL — ABNORMAL LOW (ref 13.5–17.5)
LARGE UNSTAINED CELLS: 3 % (ref 0–4)
LYMPHOCYTES ABSOLUTE COUNT: 0.6 10*9/L — ABNORMAL LOW (ref 1.5–5.0)
LYMPHOCYTES RELATIVE PERCENT: 7.8 %
MEAN CORPUSCULAR HEMOGLOBIN CONC: 31 g/dL (ref 31.0–37.0)
MEAN CORPUSCULAR HEMOGLOBIN: 34.9 pg — ABNORMAL HIGH (ref 26.0–34.0)
MEAN CORPUSCULAR VOLUME: 112.6 fL — ABNORMAL HIGH (ref 80.0–100.0)
MEAN PLATELET VOLUME: 9.6 fL (ref 7.0–10.0)
MONOCYTES ABSOLUTE COUNT: 0.7 10*9/L (ref 0.2–0.8)
MONOCYTES RELATIVE PERCENT: 9 %
NEUTROPHILS ABSOLUTE COUNT: 6.3 10*9/L (ref 2.0–7.5)
NEUTROPHILS RELATIVE PERCENT: 78.8 %
PLATELET COUNT: 406 10*9/L (ref 150–440)
RED BLOOD CELL COUNT: 2.56 10*12/L — ABNORMAL LOW (ref 4.50–5.90)
RED CELL DISTRIBUTION WIDTH: 16.3 % — ABNORMAL HIGH (ref 12.0–15.0)
WBC ADJUSTED: 8 10*9/L (ref 4.5–11.0)

## 2020-11-11 LAB — TSH: THYROID STIMULATING HORMONE: 1.785 u[IU]/mL (ref 0.550–4.780)

## 2020-11-11 LAB — T4, FREE: FREE T4: 1.27 ng/dL (ref 0.89–1.76)

## 2020-11-11 LAB — BILIRUBIN, DIRECT: BILIRUBIN DIRECT: 1.4 mg/dL — ABNORMAL HIGH (ref 0.00–0.30)

## 2020-11-11 MED ADMIN — sodium chloride (NS) 0.9 % infusion: 100 mL/h | INTRAVENOUS | @ 16:00:00 | Stop: 2020-11-12

## 2020-11-11 MED ADMIN — pembrolizumab (KEYTRUDA) 200 mg in sodium chloride (NS) 0.9 % 50 mL IVPB: 200 mg | INTRAVENOUS | @ 17:00:00 | Stop: 2020-11-11

## 2020-11-11 MED ADMIN — heparin, porcine (PF) 100 unit/mL injection 500 Units: 500 [IU] | INTRAVENOUS | @ 18:00:00 | Stop: 2020-11-12

## 2020-11-11 NOTE — Unmapped (Signed)
Per Dr. Dorna Mai, clarification placed for hgb 8.9.  Ok to treat.

## 2020-11-11 NOTE — Unmapped (Signed)
Pt still has last dose.

## 2020-11-11 NOTE — Unmapped (Signed)
Lab on 11/11/2020   Component Date Value Ref Range Status   ??? Sodium 11/11/2020 132* 135 - 145 mmol/L Final   ??? Potassium 11/11/2020 3.7  3.4 - 4.5 mmol/L Final   ??? Chloride 11/11/2020 103  98 - 107 mmol/L Final   ??? Anion Gap 11/11/2020 5  5 - 14 mmol/L Final   ??? CO2 11/11/2020 24.0  20.0 - 31.0 mmol/L Final   ??? BUN 11/11/2020 13  9 - 23 mg/dL Final   ??? Creatinine 11/11/2020 0.54* 0.60 - 1.10 mg/dL Final   ??? BUN/Creatinine Ratio 11/11/2020 24   Final   ??? EGFR CKD-EPI Non-African American,* 11/11/2020 >90  >=60 mL/min/1.24m2 Final   ??? EGFR CKD-EPI African American, Male 11/11/2020 >90  >=60 mL/min/1.70m2 Final   ??? Glucose 11/11/2020 135  70 - 179 mg/dL Final   ??? Calcium 16/08/9603 9.6  8.7 - 10.4 mg/dL Final   ??? Albumin 54/07/8118 2.8* 3.4 - 5.0 g/dL Final   ??? Total Protein 11/11/2020 7.1  5.7 - 8.2 g/dL Final   ??? Total Bilirubin 11/11/2020 1.9* 0.3 - 1.2 mg/dL Final   ??? AST 14/78/2956 88* <=34 U/L Final   ??? ALT 11/11/2020 52* 10 - 49 U/L Final   ??? Alkaline Phosphatase 11/11/2020 466* 46 - 116 U/L Final   ??? TSH 11/11/2020 1.785  0.550 - 4.780 uIU/mL Final   ??? Free T4 11/11/2020 1.27  0.89 - 1.76 ng/dL Final   ??? WBC 21/30/8657 8.0  4.5 - 11.0 10*9/L Final   ??? RBC 11/11/2020 2.56* 4.50 - 5.90 10*12/L Final   ??? HGB 11/11/2020 8.9* 13.5 - 17.5 g/dL Final   ??? HCT 84/69/6295 28.8* 41.0 - 53.0 % Final   ??? MCV 11/11/2020 112.6* 80.0 - 100.0 fL Final   ??? MCH 11/11/2020 34.9* 26.0 - 34.0 pg Final   ??? MCHC 11/11/2020 31.0  31.0 - 37.0 g/dL Final   ??? RDW 28/41/3244 16.3* 12.0 - 15.0 % Final   ??? MPV 11/11/2020 9.6  7.0 - 10.0 fL Final   ??? Platelet 11/11/2020 406  150 - 440 10*9/L Final   ??? Neutrophils % 11/11/2020 78.8  % Final   ??? Lymphocytes % 11/11/2020 7.8  % Final   ??? Monocytes % 11/11/2020 9.0  % Final   ??? Eosinophils % 11/11/2020 1.1  % Final   ??? Basophils % 11/11/2020 0.6  % Final   ??? Absolute Neutrophils 11/11/2020 6.3  2.0 - 7.5 10*9/L Final   ??? Absolute Lymphocytes 11/11/2020 0.6* 1.5 - 5.0 10*9/L Final   ??? Absolute Monocytes 11/11/2020 0.7  0.2 - 0.8 10*9/L Final   ??? Absolute Eosinophils 11/11/2020 0.1  0.0 - 0.4 10*9/L Final   ??? Absolute Basophils 11/11/2020 0.1  0.0 - 0.1 10*9/L Final   ??? Large Unstained Cells 11/11/2020 3  0 - 4 % Final   ??? Macrocytosis 11/11/2020 Marked* Not Present Final   ??? Anisocytosis 11/11/2020 Slight* Not Present Final   ??? Hypochromasia 11/11/2020 Marked* Not Present Final

## 2020-11-11 NOTE — Unmapped (Signed)
Pharmacy: First Cycle Immunotherapy/Chemotherapy Patient Education    Immunotherapy/Chemotherapy regimen/agents: pembrolizumab  Venous Access: port  Caregivers present for education: friend    [name] is a 59 year old with soft tissue sarcoma. Education was provided to the patient by an oncology pharmacist.    Patient was made aware that these side effects may not appear until a few weeks to several months after beginning treatment.     Side effects discussed included but were not limited to: infusion-related reactions, diarrhea or more bowel movements than usual, abdominal pain or tenderness, myalgia, changes in eyesight,,Dark urine, less urine output than normal, yellowing of skin or whites of eyes, cough,,dyspnea, renal toxicity,,endocrinopathies or persistent or unusual headaches    The patient handout from [facility] or the Hematology/Oncology Fellow on-call phone number for concerns after hours were given.The patient verbalized understanding of this information.  Medication reconciliation was completed and the medication list was updated in EPIC.      Approximate time spent with patient: 10  Minutes.    Marcelline Deist, PharmD, CPP

## 2020-11-11 NOTE — Unmapped (Signed)
Patient arrived to chair 36.  No complaints noted.  Access of port intact positive blood return.   Patient completed and tolerated treatment.  AVS declined and patient discharged to home.

## 2020-11-11 NOTE — Unmapped (Signed)
Port accessed.  Labs drawn & sent for analysis.  To next appt.  Care provided by Arlyss Repress RN.

## 2020-11-13 NOTE — Unmapped (Signed)
Addended by: Lajuana Matte. on: 11/12/2020 04:02 PM     Modules accepted: Orders

## 2020-11-18 ENCOUNTER — Ambulatory Visit
Admit: 2020-11-18 | Discharge: 2020-11-24 | Disposition: A | Discharge status: Home Health | Payer: PRIVATE HEALTH INSURANCE | Admitting: Hematology & Oncology

## 2020-11-18 ENCOUNTER — Other Ambulatory Visit
Admit: 2020-11-18 | Discharge: 2020-11-24 | Disposition: A | Discharge status: Home Health | Payer: PRIVATE HEALTH INSURANCE | Admitting: Hematology & Oncology

## 2020-11-18 ENCOUNTER — Encounter
Admit: 2020-11-18 | Discharge: 2020-11-24 | Disposition: A | Discharge status: Home Health | Payer: PRIVATE HEALTH INSURANCE | Attending: Anesthesiology | Admitting: Hematology & Oncology

## 2020-11-18 ENCOUNTER — Ambulatory Visit
Admit: 2020-11-18 | Discharge: 2020-11-24 | Disposition: A | Discharge status: Home Health | Payer: PRIVATE HEALTH INSURANCE | Attending: Student in an Organized Health Care Education/Training Program | Admitting: Hematology & Oncology

## 2020-11-18 DIAGNOSIS — C7800 Secondary malignant neoplasm of unspecified lung: Principal | ICD-10-CM

## 2020-11-18 DIAGNOSIS — M549 Dorsalgia, unspecified: Principal | ICD-10-CM

## 2020-11-18 DIAGNOSIS — E099 Drug or chemical induced diabetes mellitus without complications: Principal | ICD-10-CM

## 2020-11-18 DIAGNOSIS — E43 Unspecified severe protein-calorie malnutrition: Principal | ICD-10-CM

## 2020-11-18 DIAGNOSIS — Z20822 Contact with and (suspected) exposure to covid-19: Principal | ICD-10-CM

## 2020-11-18 DIAGNOSIS — K7689 Other specified diseases of liver: Principal | ICD-10-CM

## 2020-11-18 DIAGNOSIS — E119 Type 2 diabetes mellitus without complications: Principal | ICD-10-CM

## 2020-11-18 DIAGNOSIS — I82621 Acute embolism and thrombosis of deep veins of right upper extremity: Principal | ICD-10-CM

## 2020-11-18 DIAGNOSIS — E871 Hypo-osmolality and hyponatremia: Principal | ICD-10-CM

## 2020-11-18 DIAGNOSIS — G629 Polyneuropathy, unspecified: Principal | ICD-10-CM

## 2020-11-18 DIAGNOSIS — C7989 Secondary malignant neoplasm of other specified sites: Principal | ICD-10-CM

## 2020-11-18 DIAGNOSIS — I5022 Chronic systolic (congestive) heart failure: Principal | ICD-10-CM

## 2020-11-18 DIAGNOSIS — C4911 Malignant neoplasm of connective and soft tissue of right upper limb, including shoulder: Principal | ICD-10-CM

## 2020-11-18 DIAGNOSIS — Z79899 Other long term (current) drug therapy: Principal | ICD-10-CM

## 2020-11-18 DIAGNOSIS — K9189 Other postprocedural complications and disorders of digestive system: Principal | ICD-10-CM

## 2020-11-18 DIAGNOSIS — G893 Neoplasm related pain (acute) (chronic): Principal | ICD-10-CM

## 2020-11-18 DIAGNOSIS — T451X5A Adverse effect of antineoplastic and immunosuppressive drugs, initial encounter: Principal | ICD-10-CM

## 2020-11-18 DIAGNOSIS — Z85831 Personal history of malignant neoplasm of soft tissue: Principal | ICD-10-CM

## 2020-11-18 DIAGNOSIS — C4912 Malignant neoplasm of connective and soft tissue of left upper limb, including shoulder: Principal | ICD-10-CM

## 2020-11-18 DIAGNOSIS — R600 Localized edema: Principal | ICD-10-CM

## 2020-11-18 DIAGNOSIS — Z7901 Long term (current) use of anticoagulants: Principal | ICD-10-CM

## 2020-11-18 DIAGNOSIS — Z6820 Body mass index (BMI) 20.0-20.9, adult: Principal | ICD-10-CM

## 2020-11-18 DIAGNOSIS — Z7984 Long term (current) use of oral hypoglycemic drugs: Principal | ICD-10-CM

## 2020-11-18 DIAGNOSIS — K831 Obstruction of bile duct: Principal | ICD-10-CM

## 2020-11-18 DIAGNOSIS — I110 Hypertensive heart disease with heart failure: Principal | ICD-10-CM

## 2020-11-18 DIAGNOSIS — Z794 Long term (current) use of insulin: Principal | ICD-10-CM

## 2020-11-18 DIAGNOSIS — C499 Malignant neoplasm of connective and soft tissue, unspecified: Principal | ICD-10-CM

## 2020-11-18 LAB — COMPREHENSIVE METABOLIC PANEL
ALBUMIN: 2.4 g/dL — ABNORMAL LOW (ref 3.4–5.0)
ALBUMIN: 2.4 g/dL — ABNORMAL LOW (ref 3.4–5.0)
ALKALINE PHOSPHATASE: 548 U/L — ABNORMAL HIGH (ref 46–116)
ALKALINE PHOSPHATASE: 552 U/L — ABNORMAL HIGH (ref 46–116)
ALT (SGPT): 38 U/L (ref 10–49)
ALT (SGPT): 38 U/L (ref 10–49)
ANION GAP: 8 mmol/L (ref 5–14)
ANION GAP: 6 mmol/L (ref 5–14)
AST (SGOT): 83 U/L — ABNORMAL HIGH (ref <=34)
AST (SGOT): 75 U/L — ABNORMAL HIGH (ref <=34)
BILIRUBIN TOTAL: 5.1 mg/dL — ABNORMAL HIGH (ref 0.3–1.2)
BILIRUBIN TOTAL: 5.1 mg/dL — ABNORMAL HIGH (ref 0.3–1.2)
BLOOD UREA NITROGEN: 25 mg/dL — ABNORMAL HIGH (ref 9–23)
BLOOD UREA NITROGEN: 24 mg/dL — ABNORMAL HIGH (ref 9–23)
BUN / CREAT RATIO: 38
BUN / CREAT RATIO: 41
CALCIUM: 9.6 mg/dL (ref 8.7–10.4)
CALCIUM: 9.6 mg/dL (ref 8.7–10.4)
CHLORIDE: 100 mmol/L (ref 98–107)
CHLORIDE: 101 mmol/L (ref 98–107)
CO2: 21 mmol/L (ref 20.0–31.0)
CO2: 23 mmol/L (ref 20.0–31.0)
CREATININE: 0.66 mg/dL
CREATININE: 0.59 mg/dL — ABNORMAL LOW
EGFR CKD-EPI AA MALE: >90 mL/min/{1.73_m2} (ref >=60)
EGFR CKD-EPI AA MALE: >90 mL/min/{1.73_m2} (ref >=60)
EGFR CKD-EPI NON-AA MALE: >90 mL/min/{1.73_m2} (ref >=60)
EGFR CKD-EPI NON-AA MALE: >90 mL/min/{1.73_m2} (ref >=60)
GLUCOSE RANDOM: 198 mg/dL — ABNORMAL HIGH (ref 70–179)
GLUCOSE RANDOM: 115 mg/dL (ref 70–179)
POTASSIUM: 3.9 mmol/L (ref 3.4–4.5)
POTASSIUM: 4 mmol/L (ref 3.4–4.5)
PROTEIN TOTAL: 7 g/dL (ref 5.7–8.2)
PROTEIN TOTAL: 7 g/dL (ref 5.7–8.2)
SODIUM: 129 mmol/L — ABNORMAL LOW (ref 135–145)
SODIUM: 130 mmol/L — ABNORMAL LOW (ref 135–145)

## 2020-11-18 LAB — CBC W/ AUTO DIFF
BASOPHILS ABSOLUTE COUNT: 0 10*9/L (ref 0.0–0.1)
BASOPHILS ABSOLUTE COUNT: 0.1 10*9/L (ref 0.0–0.1)
BASOPHILS RELATIVE PERCENT: 0.5 %
BASOPHILS RELATIVE PERCENT: 0.6 %
EOSINOPHILS ABSOLUTE COUNT: 0.1 10*9/L (ref 0.0–0.4)
EOSINOPHILS ABSOLUTE COUNT: 0.1 10*9/L (ref 0.0–0.4)
EOSINOPHILS RELATIVE PERCENT: 0.7 %
EOSINOPHILS RELATIVE PERCENT: 0.9 %
HEMATOCRIT: 27.6 % — ABNORMAL LOW (ref 41.0–53.0)
HEMATOCRIT: 28.7 % — ABNORMAL LOW (ref 41.0–53.0)
HEMOGLOBIN: 8.3 g/dL — ABNORMAL LOW (ref 13.5–17.5)
HEMOGLOBIN: 9 g/dL — ABNORMAL LOW (ref 13.5–17.5)
LARGE UNSTAINED CELLS: 3 % (ref 0–4)
LARGE UNSTAINED CELLS: 3 % (ref 0–4)
LYMPHOCYTES ABSOLUTE COUNT: 0.4 10*9/L — ABNORMAL LOW (ref 1.5–5.0)
LYMPHOCYTES ABSOLUTE COUNT: 0.6 10*9/L — ABNORMAL LOW (ref 1.5–5.0)
LYMPHOCYTES RELATIVE PERCENT: 6.2 %
LYMPHOCYTES RELATIVE PERCENT: 7.1 %
MEAN CORPUSCULAR HEMOGLOBIN CONC: 29.9 g/dL — ABNORMAL LOW (ref 31.0–37.0)
MEAN CORPUSCULAR HEMOGLOBIN CONC: 31.2 g/dL (ref 31.0–37.0)
MEAN CORPUSCULAR HEMOGLOBIN: 32.5 pg (ref 26.0–34.0)
MEAN CORPUSCULAR HEMOGLOBIN: 33.7 pg (ref 26.0–34.0)
MEAN CORPUSCULAR VOLUME: 108.7 fL — ABNORMAL HIGH (ref 80.0–100.0)
MEAN CORPUSCULAR VOLUME: 107.9 fL — ABNORMAL HIGH (ref 80.0–100.0)
MEAN PLATELET VOLUME: 9.9 fL (ref 7.0–10.0)
MEAN PLATELET VOLUME: 9.9 fL (ref 7.0–10.0)
MONOCYTES ABSOLUTE COUNT: 0.4 10*9/L (ref 0.2–0.8)
MONOCYTES ABSOLUTE COUNT: 0.4 10*9/L (ref 0.2–0.8)
MONOCYTES RELATIVE PERCENT: 5 %
MONOCYTES RELATIVE PERCENT: 5.7 %
NEUTROPHILS ABSOLUTE COUNT: 5.9 10*9/L (ref 2.0–7.5)
NEUTROPHILS ABSOLUTE COUNT: 6.4 10*9/L (ref 2.0–7.5)
NEUTROPHILS RELATIVE PERCENT: 84.5 %
NEUTROPHILS RELATIVE PERCENT: 83.3 %
PLATELET COUNT: 442 10*9/L — ABNORMAL HIGH (ref 150–440)
PLATELET COUNT: 508 10*9/L — ABNORMAL HIGH (ref 150–440)
RED BLOOD CELL COUNT: 2.54 10*12/L — ABNORMAL LOW (ref 4.50–5.90)
RED BLOOD CELL COUNT: 2.66 10*12/L — ABNORMAL LOW (ref 4.50–5.90)
RED CELL DISTRIBUTION WIDTH: 15.7 % — ABNORMAL HIGH (ref 12.0–15.0)
RED CELL DISTRIBUTION WIDTH: 15.7 % — ABNORMAL HIGH (ref 12.0–15.0)
WBC ADJUSTED: 6.9 10*9/L (ref 4.5–11.0)
WBC ADJUSTED: 7.7 10*9/L (ref 4.5–11.0)

## 2020-11-18 LAB — MAGNESIUM: MAGNESIUM: 1.6 mg/dL (ref 1.6–2.6)

## 2020-11-18 LAB — PHOSPHORUS: PHOSPHORUS: 3.4 mg/dL (ref 2.4–5.1)

## 2020-11-18 LAB — BILIRUBIN, DIRECT: BILIRUBIN DIRECT: 4.2 mg/dL — ABNORMAL HIGH (ref 0.00–0.30)

## 2020-11-18 LAB — SLIDE REVIEW

## 2020-11-18 NOTE — Unmapped (Signed)
[facility] SARCOMA ONCOLOGY FOLLOW UP VISIT  & Brief Admission Note    Encounter Date: 11/18/2020  Patient Name: Ivan Banks  Medical Record Number: [id number]    Referring Physician: Dr Edilia Bo, [facility] onc     Consulting Physician: Dr Glennon Mac, [facility] [place], [place]    Primary Care Provider: Suanne Marker, MD     DIAGNOSIS: STS RIGHT forearm, with presumed lung and axillary metastases    ASSESSMENT: 59 y.o. 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  I reviewed the [facility] Sarcoma Targeted Gene Fusion Panel results for this patient, which did not note fusions in NR4A3 (extraskeletal myxoid chondrosarcoma) or HEY1-NCOA2 (mesenchymal chondrosarcoma).  Patient enrolled on  [id number] olaparib trial for his IDH-mutated disease but taken off-study after 2 cycles of olaparib due to mixed response / progression on imaging. Previously on doxorubicin as second-line therapy with mild progression in several lesions after 4 cycles.  Currently on pembrolizumab in the third-line.    Today we discussed labs indicating rising bilirubin concerning for biliary obstruction.  He's coming from over 1 hour away and I have concerns that outpatient procedure to address this could be coordinated quickly given the upcoming new years holiday, risk of interval infection, and rapid rise in bilirubin consistent with large or total obstruction.  He was agreeable for admission today to further evaluate and potentially address the obstruction.  I would recommend starting with a RUQ ultrasound to confirm obstruction, followed by discussion with GI biliary service and potentially MRI to better characterize obstruction.    - RUQ [place] for potential biliary obstruction  - Consider biliary consultation and MRCP if findings of obstruction on [place]         Pain:   Much improved on long and short acting opiate regimen.  Follows with [facility] palliative care      Right brachial vein DVT:    PVL ultrasound 08/06/2020 revealed occlusive thrombus in right mid brachial vein.  Patient reports having an IV at the site of the swelling that infiltrated during hospitalization for elevated glucose levels, and that pain and swelling (both at immediate IV site as well as distal arm) started while IV was in place.  Risk factors are active malignancy.  We again discussed this may be catheter-related and, while evidence supports continuing anticoagulation long-term for lower extremity DVTs given pro-thrombotic malignancy, we could reconsider this in the future depending on the clinical circumstances (e.g. how well he tolerates therapy, cost, tumor burden).  He confirms is taking 5mg  BID as directed.  We previously discussed evaluation by vascular surgery given ongoing swelling despite therapeutic anticoagulation.  - Continue apixabin 5mg  BID  - Vascular surgery evaluation upcoming    I personally reviewed the medical records, pathology and laboratory results and viewed the imaging. All questions were answered to the patient's satisfaction and they voiced understanding and agreement with the plan. Pt has my card and contact information and is encouraged to reach out with any further questions.      REASON FOR CONSULTATION:   Ivan Banks is a 59 y.o. male who is seen in consultation at the request of Self, Referred for evaluation of his soft tissue sarcoma.      HISTORY OF PRESENT ILLNESS:      Since  his last visit there have been no acute events.  Today he is here for an interval followup on labs from last week which indicated elevated direct bilirubinemia.  He admits to no major changes since last week but on further questioning admits vague right upper quadrant abdominal pain and tenderness, as well as reduced appetite and some abdominal bloating.  No new issues otherwise since receiving pembrolizumab, including no rash, diarrhea, dyspnea, or new cough.    Oncologic timeline:  - 2015-2016: Patient first notes small nodule in middle of RIGHT anterior forearm, nonpainful  - 06/12/19: Meets with local general surgeon, CT of RUE ordered  - 06/15/19: CT Chest w contrast: Right axillary lymphadenopathy largest 3.7cm, multiple solid/subsolid  Pulmonary nodules bilaterally with largest 8.89mm over right lower lobe.  - 06/25/19: US guided biopsy of R forearm and axillary masses.  Pathology revealed a epitheloid / undifferentiated malignancy suspicious for an undifferentiated sarcoma, IHC (+) Vimentin, ema, CD99 (-) CK7, CK20, OSCAR, S100, HMB-45, Desmin, smooth muscle actin, SOX-10, CEA, Calponin, cytokeratin, CD68, WT1.  In addition, there was equivocal or focal staining for p63.  Staining for INI1 showed nuclear localization (normal result).   - Sept 2020: palliative RT to the right forearm (55Gy/31fx) and R axilla (48Gy/85fx) finished 08/22/2019  - 08/23/2019: STRATA molecular testing: IDH1 pR132C, MS:S, TMB 41mut/Mb (low), also detected variants of uncertain significance: MLL3 M1242K (53% VAF), TAF1L K914E (5% VAF), TPR Q1063* (9% VAF).  PD-L1 by RNA expression was 49 (scale 0-100) with estimated 20% tumor content.  - 10/30/19: MRI LEFT forearm: 6.5 x 3.1 x 3.8 cm mass in left flexor compartment, likely focus of metastatic disease  - 12/19/19: Completes palliative XRT to LEFT forearm lesion, 5500cGy with Dr Rayetta Humphrey  - 01/01/20: CT Chest wo contrast: Decrease in right axillary lymphadenopathy, two new right lower lobe and left upper lobe 0.7 cm pulmonary nodules, increased size of left paratracheal node, and increased size of right subpectoral node compatible with metastatic disease  - 04/01/20: CT chest wo contrast: Interval increased size of pulmonary metastasis and multi compartmental mediastinal and right axillary nodal metastasis consistent with disease progression.  Interval prominence of upper abdominal lymphadenopathy, concerning for metastatic disease. Recommend CT abdomen and pelvis for better characterization.  - 05/06/20: CT Chest wo contrast: Increased burden of metastatic disease including increased size of multiple pulmonary nodules, axillary, retrocrural, mediastinal and gastrohepatic lymphadenopathy. Additionally there are peritoneal masses concerning for metastatic disease.  - 05/14/20: C1D1 olaparib per [id number] (300mg  po BID)  -06/10/20: Interruption in start of C2 due to anemia (14.2 -> 8.3) with C1 olaparib, recheck H&H in 2 weeks  - 06/24/20: Start of C2 olaparib, DR to 250mg  BID  - 07/25/20: CT Chest/Abdomen/Pelvis w contrast: Mixed response, mostly progression, with decrease size of pulmonary nodules and increase in mediastinal, abdominal and pelvic nodal and soft tissue implants.  - 08/12/20: C1 doxorubicin 75mg /m2, tolerated well  - 09/02/20: C2 doxorubicin 75mg /m2, tolerated well  - 09/11/20: Admitted [facility] for RUE pain and swelling, was restarted on apixaban lead-in after discovered he was taking 5mg  once daily  - 09/11/20: CT Chest w contrast: Stable axillary nodes and pulmonary nodules  - 09/23/20: CT C/A/P w contrast: Mixed response with stability in the thorax and some abdominal lesions, with mild to minimal progression in several abdominal lesions.  Mild progression may be due to interval between baseline imaging (07/25/20) and start of C1 doxorubicin (08/12/20)  - 09/24/20: C3 doxorubicin 75mg /m2, tolerated well  - 10/14/20: C4  doxorubicin 75mg /m2, tolerated reasonably well with increasing fatigue  - 11/03/20: CT C/A/P with mixed mild decrease to mild increase in several abdominal lesions and enlarging small right pleural effusion  - 11/11/20: C1D1 pembrolizumab, tolerated well      REVIEW OF SYSTEMS:  A comprehensive review of 12 systems was negative except for pertinent positives noted in HPI.    Past Medical History:   Diagnosis Date   ??? Anemia    ??? Anxiety    ??? Cancer (CMS-HCC)    ??? Chronic back pain    ??? Diabetes mellitus (CMS-HCC) 08/08/2020   ??? Hypertension    ??? Substance abuse (CMS-HCC)    - Pancreatitis 2009  - Last colonoscopy 2009       Past Surgical History:   Procedure Laterality Date   ??? IR INSERT PORT AGE GREATER THAN 5 YRS  08/12/2020    IR INSERT PORT AGE GREATER THAN 5 YRS 08/12/2020 Jobe Gibbon, MD IMG VIR [facility]   ??? KNEE ARTHROSCOPY Left 1990        Family History   Problem Relation Age of Onset   ??? Cancer Mother    ??? Diabetes Father    ??? Kidney disease Father    ??? Diabetes Sister    ??? Lupus Sister    ??? No Known Problems Brother         Social History     Occupational History   ??? Occupation: Boilermaker   ??? Occupation: Nurse, adult   ??? Occupation: Disability   Tobacco Use   ??? Smoking status: Never Smoker   ??? Smokeless tobacco: Never Used   Vaping Use   ??? Vaping Use: Never used   Substance and Sexual Activity   ??? Alcohol use: Not Currently   ??? Drug use: Yes     Frequency: 21.0 times per week     Types: Marijuana, Benzodiazepines     Comment: Smokes 2-3x a day   ??? Sexual activity: Yes   - Works on a horse farm  - Previously had routine cannabis use, stopped in order to enroll in clinical trial at [facility] in summer 2021      ALLERGIES/MEDICATIONS:  Reviewed in EPIC    PHYSICAL EXAM:   Vitals: BP 142/68  - Pulse 80  - Temp 36.4 ??C (97.5 ??F)  - Resp 19  - Wt 72.6 kg (160 lb)  - SpO2 100%  - BMI 21.08 kg/m??   Gen: Comfortable, fatigued appearing, NAD. ECOG PS 1  Eyes: EOMI, sclera icteric  ENT:  MMM.   Neck: Supple, trachea midline  Lymph: No cervical, submandibular, or supraclavicular lymphadenopathy.  There is a palpable firm fixed nodule in the deep right axilla, mildly tender today  CV: RRR no murmurs   Lungs: CTAB w/o rales, rhochi, or wheezing, good respiratory effort   GI: +BS, Soft, mildly distended, vague right-sided tenderness with palpation  Ext: Warm and well perfused. No cyanosis or clubbing, moderate edema of right arm up to the elbow.  Fingers are warm with normal cap refill, reduced grip on right due to swelling  Skin: No rashes. 2x5cm darkened macula on right forearm from radiation treatment, left forearm just distal to elbow an area of darkened skin.   Neuro: Moving all extremities, speech fluid.  Psych: mood euthymic and affect appropriate   MSK: No focal bony deformaties or tenderness    +++++++++++++++++++++++++++++++++++++++++++++++++++++++++++    PATHOLOGY:   Pathology was personally reviewed as described in the HPI,  detailed in EPIC.  06/25/2019 Biopsy:  Final Diagnosis   A: Axilla, right, biopsy  - Metastatic, epithelioid/undifferentiated malignancy.  - See diagnostic comment.  ??  B: Forearm, right, biopsy  - Malignant epithelioid malignancy.  - See diagnostic comment.  ??  This electronic signature is attestation that the pathologist personally reviewed the submitted material(s) and the final diagnosis reflects that evaluation.   Electronically signed by Meyer Cory, MD on 07/04/2019 at 1511   Comment    Immunohistochemical staining was performed on this material with the following results: (+) Vimentin, ema, CD99 (-) CK7, CK20, OSCAR, S100, HMB-45, Desmin, smooth muscle actin, SOX-10, CEA, Calponin, cytokeratin, CD68, WT1.  In addition, there was equivocal or focal staining for p63.  Staining for INI1 showed nuclear localization (normal result).  ??  This material was shown in consultation to Dr. Eyvonne Left and Dr. Linton Rump of Dermatopathology.  ??  Overall, this represents a epithelioid malignancy, most likely a sarcoma, of unknown differentiation.  Material will be sent for detection of gene fusions in an effort to further classify.  A supplemental report will follow.     Addendum   Results from the Sarcoma Targeted Gene Fusion Panel (SARCP) performed at [facility]:  ??  No reportable fusions identified in 138 targeted genes.         LABS:  Reviewed in EPIC      RADIOLOGY:  Imaging was personally viewed as summarized in the history (see above).  I agree with the findings/interpretation; details in EPIC.

## 2020-11-18 NOTE — Unmapped (Signed)
Port accessed.  Labs collected & sent for analysis.  Port flushed, heparin-locked & de-accessed. To next appt.   Care provided by Williams Che RN.

## 2020-11-19 DIAGNOSIS — M549 Dorsalgia, unspecified: Principal | ICD-10-CM

## 2020-11-19 DIAGNOSIS — Z20822 Contact with and (suspected) exposure to covid-19: Principal | ICD-10-CM

## 2020-11-19 DIAGNOSIS — C7989 Secondary malignant neoplasm of other specified sites: Principal | ICD-10-CM

## 2020-11-19 DIAGNOSIS — K831 Obstruction of bile duct: Principal | ICD-10-CM

## 2020-11-19 DIAGNOSIS — Z79899 Other long term (current) drug therapy: Principal | ICD-10-CM

## 2020-11-19 DIAGNOSIS — E43 Unspecified severe protein-calorie malnutrition: Principal | ICD-10-CM

## 2020-11-19 DIAGNOSIS — Z6820 Body mass index (BMI) 20.0-20.9, adult: Principal | ICD-10-CM

## 2020-11-19 DIAGNOSIS — T451X5A Adverse effect of antineoplastic and immunosuppressive drugs, initial encounter: Principal | ICD-10-CM

## 2020-11-19 DIAGNOSIS — I5022 Chronic systolic (congestive) heart failure: Principal | ICD-10-CM

## 2020-11-19 DIAGNOSIS — I82621 Acute embolism and thrombosis of deep veins of right upper extremity: Principal | ICD-10-CM

## 2020-11-19 DIAGNOSIS — K7689 Other specified diseases of liver: Principal | ICD-10-CM

## 2020-11-19 DIAGNOSIS — I110 Hypertensive heart disease with heart failure: Principal | ICD-10-CM

## 2020-11-19 DIAGNOSIS — C7800 Secondary malignant neoplasm of unspecified lung: Principal | ICD-10-CM

## 2020-11-19 DIAGNOSIS — C4911 Malignant neoplasm of connective and soft tissue of right upper limb, including shoulder: Principal | ICD-10-CM

## 2020-11-19 DIAGNOSIS — R600 Localized edema: Principal | ICD-10-CM

## 2020-11-19 DIAGNOSIS — G893 Neoplasm related pain (acute) (chronic): Principal | ICD-10-CM

## 2020-11-19 DIAGNOSIS — E099 Drug or chemical induced diabetes mellitus without complications: Principal | ICD-10-CM

## 2020-11-19 DIAGNOSIS — K9189 Other postprocedural complications and disorders of digestive system: Principal | ICD-10-CM

## 2020-11-19 DIAGNOSIS — Z7901 Long term (current) use of anticoagulants: Principal | ICD-10-CM

## 2020-11-19 DIAGNOSIS — E871 Hypo-osmolality and hyponatremia: Principal | ICD-10-CM

## 2020-11-19 DIAGNOSIS — C4912 Malignant neoplasm of connective and soft tissue of left upper limb, including shoulder: Principal | ICD-10-CM

## 2020-11-19 DIAGNOSIS — Z7984 Long term (current) use of oral hypoglycemic drugs: Principal | ICD-10-CM

## 2020-11-19 LAB — SODIUM, URINE, RANDOM: SODIUM URINE: 73 mmol/L

## 2020-11-19 LAB — CBC W/ AUTO DIFF
BASOPHILS ABSOLUTE COUNT: 0.1 10*9/L (ref 0.0–0.1)
BASOPHILS RELATIVE PERCENT: 0.7 %
EOSINOPHILS ABSOLUTE COUNT: 0.1 10*9/L (ref 0.0–0.4)
EOSINOPHILS RELATIVE PERCENT: 0.9 %
HEMATOCRIT: 28.4 % — ABNORMAL LOW (ref 41.0–53.0)
HEMOGLOBIN: 8.7 g/dL — ABNORMAL LOW (ref 13.5–17.5)
LARGE UNSTAINED CELLS: 3 % (ref 0–4)
LYMPHOCYTES ABSOLUTE COUNT: 0.5 10*9/L — ABNORMAL LOW (ref 1.5–5.0)
LYMPHOCYTES RELATIVE PERCENT: 6.2 %
MEAN CORPUSCULAR HEMOGLOBIN CONC: 30.7 g/dL — ABNORMAL LOW (ref 31.0–37.0)
MEAN CORPUSCULAR HEMOGLOBIN: 33 pg (ref 26.0–34.0)
MEAN CORPUSCULAR VOLUME: 107.3 fL — ABNORMAL HIGH (ref 80.0–100.0)
MEAN PLATELET VOLUME: 9.8 fL (ref 7.0–10.0)
MONOCYTES ABSOLUTE COUNT: 0.5 10*9/L (ref 0.2–0.8)
MONOCYTES RELATIVE PERCENT: 5.9 %
NEUTROPHILS ABSOLUTE COUNT: 6.4 10*9/L (ref 2.0–7.5)
NEUTROPHILS RELATIVE PERCENT: 83.5 %
PLATELET COUNT: 522 10*9/L — ABNORMAL HIGH (ref 150–440)
RED BLOOD CELL COUNT: 2.64 10*12/L — ABNORMAL LOW (ref 4.50–5.90)
RED CELL DISTRIBUTION WIDTH: 15.6 % — ABNORMAL HIGH (ref 12.0–15.0)
WBC ADJUSTED: 7.7 10*9/L (ref 4.5–11.0)

## 2020-11-19 LAB — APTT
APTT: 33.5 s (ref 24.9–36.9)
APTT: 39.8 s — ABNORMAL HIGH (ref 24.9–36.9)
APTT: 79.3 s — ABNORMAL HIGH (ref 24.9–36.9)
HEPARIN CORRELATION: 0.2
HEPARIN CORRELATION: 0.2
HEPARIN CORRELATION: 0.5

## 2020-11-19 LAB — URINALYSIS
BLOOD UA: NEGATIVE
GLUCOSE UA: NEGATIVE
HYALINE CASTS: 1 /LPF (ref 0–1)
KETONES UA: NEGATIVE
LEUKOCYTE ESTERASE UA: NEGATIVE
NITRITE UA: NEGATIVE
PH UA: 5 (ref 5.0–9.0)
PROTEIN UA: 100 — AB
RBC UA: 1 /HPF (ref <=3)
SPECIFIC GRAVITY UA: 1.023 (ref 1.003–1.030)
SQUAMOUS EPITHELIAL: <1 /HPF (ref 0–5)
UROBILINOGEN UA: 4 — AB
WBC UA: 1 /HPF (ref <=2)

## 2020-11-19 LAB — COMPREHENSIVE METABOLIC PANEL
ALBUMIN: 2.5 g/dL — ABNORMAL LOW (ref 3.4–5.0)
ALKALINE PHOSPHATASE: 558 U/L — ABNORMAL HIGH (ref 46–116)
ALT (SGPT): 36 U/L (ref 10–49)
ANION GAP: 8 mmol/L (ref 5–14)
AST (SGOT): 71 U/L — ABNORMAL HIGH (ref <=34)
BILIRUBIN TOTAL: 5.3 mg/dL — ABNORMAL HIGH (ref 0.3–1.2)
BLOOD UREA NITROGEN: 25 mg/dL — ABNORMAL HIGH (ref 9–23)
BUN / CREAT RATIO: 41
CALCIUM: 9.5 mg/dL (ref 8.7–10.4)
CHLORIDE: 101 mmol/L (ref 98–107)
CO2: 23 mmol/L (ref 20.0–31.0)
CREATININE: 0.61 mg/dL
EGFR CKD-EPI AA MALE: >90 mL/min/{1.73_m2} (ref >=60)
EGFR CKD-EPI NON-AA MALE: >90 mL/min/{1.73_m2} (ref >=60)
GLUCOSE RANDOM: 157 mg/dL (ref 70–179)
POTASSIUM: 4.1 mmol/L (ref 3.4–4.5)
PROTEIN TOTAL: 7 g/dL (ref 5.7–8.2)
SODIUM: 132 mmol/L — ABNORMAL LOW (ref 135–145)

## 2020-11-19 LAB — PROTIME-INR
INR: 1.21
PROTIME: 14.1 s — ABNORMAL HIGH (ref 10.3–13.4)

## 2020-11-19 LAB — B-TYPE NATRIURETIC PEPTIDE: B-TYPE NATRIURETIC PEPTIDE: 1084 pg/mL — ABNORMAL HIGH (ref <=100)

## 2020-11-19 LAB — LIPASE: LIPASE: 47 U/L (ref 12–53)

## 2020-11-19 LAB — BILIRUBIN, DIRECT: BILIRUBIN DIRECT: 4.1 mg/dL — ABNORMAL HIGH (ref 0.00–0.30)

## 2020-11-19 LAB — TSH: THYROID STIMULATING HORMONE: 1.095 u[IU]/mL (ref 0.550–4.780)

## 2020-11-19 LAB — CREATININE, URINE: CREATININE, URINE: 140 mg/dL

## 2020-11-19 LAB — PHOSPHORUS: PHOSPHORUS: 3.7 mg/dL (ref 2.4–5.1)

## 2020-11-19 LAB — MAGNESIUM: MAGNESIUM: 1.6 mg/dL (ref 1.6–2.6)

## 2020-11-19 MED ADMIN — oxyCODONE (ROXICODONE) immediate release tablet 10 mg: 10 mg | ORAL | @ 23:00:00 | Stop: 2020-11-21

## 2020-11-19 MED ADMIN — oxyCODONE (OXYCONTIN) 12 hr crush resistant ER/CR tablet 20 mg: 20 mg | ORAL | @ 02:00:00 | Stop: 2020-11-24

## 2020-11-19 MED ADMIN — heparin (porcine) 1000 unit/mL injection 5,000 Units: 5000 [IU] | INTRAVENOUS | @ 19:00:00 | Stop: 2020-11-20

## 2020-11-19 MED ADMIN — apixaban (ELIQUIS) tablet 5 mg: 5 mg | ORAL | @ 02:00:00 | Stop: 2020-11-18

## 2020-11-19 MED ADMIN — insulin lispro (HumaLOG) injection 0-12 Units: 0-12 [IU] | SUBCUTANEOUS | @ 17:00:00 | Stop: 2020-11-24

## 2020-11-19 MED ADMIN — iohexoL (OMNIPAQUE) 350 mg iodine/mL solution 75 mL: 75 mL | INTRAVENOUS | @ 22:00:00 | Stop: 2020-11-19

## 2020-11-19 MED ADMIN — lisinopriL (PRINIVIL,ZESTRIL) tablet 20 mg: 20 mg | ORAL | @ 02:00:00 | Stop: 2020-11-24

## 2020-11-19 MED ADMIN — sodium chloride (NS) 0.9 % flush 10 mL: 10 mL | INTRAVENOUS | @ 14:00:00 | Stop: 2020-11-24

## 2020-11-19 MED ADMIN — silver sulfaDIAZINE (SILVADENE, SSD) 1 % cream: TOPICAL | @ 14:00:00 | Stop: 2020-11-24

## 2020-11-19 MED ADMIN — oxyCODONE (OXYCONTIN) 12 hr crush resistant ER/CR tablet 20 mg: 20 mg | ORAL | @ 14:00:00 | Stop: 2020-11-24

## 2020-11-19 MED ADMIN — oxyCODONE (ROXICODONE) immediate release tablet 10 mg: 10 mg | ORAL | @ 08:00:00 | Stop: 2020-11-21

## 2020-11-19 MED ADMIN — oxyCODONE (ROXICODONE) immediate release tablet 10 mg: 10 mg | ORAL | Stop: 2020-11-21

## 2020-11-19 MED ADMIN — polyethylene glycol (MIRALAX) packet 17 g: 17 g | ORAL | @ 14:00:00 | Stop: 2020-11-24

## 2020-11-19 MED ADMIN — silver sulfaDIAZINE (SILVADENE, SSD) 1 % cream: TOPICAL | @ 04:00:00 | Stop: 2020-11-24

## 2020-11-19 MED ADMIN — heparin 25,000 Units/250 mL (100 units/mL) in 0.9% saline infusion: 18 [IU]/kg/h | INTRAVENOUS | @ 11:00:00 | Stop: 2020-11-20

## 2020-11-19 MED ADMIN — sodium chloride (NS) 0.9 % flush 10 mL: 10 mL | INTRAVENOUS | @ 02:00:00 | Stop: 2020-11-24

## 2020-11-19 NOTE — Unmapped (Signed)
Hematology/Oncology Brief Admission Communication Note    Ivan Banks is a 59 y.o. male with a diagnosis of undifferentiated sarcoma.      HPI/Reason for admission: He is being admitted today due to biliary obstruction. Today he is here for an interval followup on labs from last week which indicated elevated direct bilirubinemia.  He admits to no major changes since last week but on further questioning admits vague right upper quadrant abdominal pain and tenderness, as well as reduced appetite and some abdominal bloating.  No new issues otherwise since receiving pembrolizumab, including no rash, diarrhea, dyspnea, or new cough.    Plan for infusion center:   If the patient has an active treatment plan, chemotherapy to be administered: no     Infusions or supportive care ordered to be started in infusion:   - labs done in clinic     Additional testing/orders to be carried out prior to admission:   - atarax PRN    The preliminary plan and goals of admission are:  - RUQ [place] for potential biliary obstruction  - Consider biliary consultation and MRCP if findings of obstruction on [place]    The primary outpatient oncologist for this patient is Dr. Dorna Mai    General:               resting comfortably, in no acute distress  HEENT:               EOMI, scleral icterus  Cardiovascular:   regular rate and rhythm.  Respiratory:         clear to auscultation bilaterally  Musculoskeletal:  full ROM of upper and lower extremities  Extremities:          extremities are warm and without edema  Skin:                    no rashes, lesions or breakdown  Psychiatric:          appropriate  Neuro:                 alert & oriented x 3     Lab Results   Component Value Date    WBC 6.9 11/18/2020    HGB 8.3 (L) 11/18/2020    HCT 27.6 (L) 11/18/2020    PLT 442 (H) 11/18/2020       Lab Results   Component Value Date    NA 129 (L) 11/18/2020    K 3.9 11/18/2020    CL 100 11/18/2020    CO2 21.0 11/18/2020    BUN 25 (H) 11/18/2020 CREATININE 0.66 11/18/2020    GLU 198 (H) 11/18/2020    CALCIUM 9.6 11/18/2020    MG 2.1 07/29/2020       Lab Results   Component Value Date    BILITOT 5.1 (H) 11/18/2020    BILIDIR 4.20 (H) 11/18/2020    PROT 7.0 11/18/2020    ALBUMIN 2.4 (L) 11/18/2020    ALT 38 11/18/2020    AST 83 (H) 11/18/2020    ALKPHOS 548 (H) 11/18/2020    GGT 27 07/29/2020       Lab Results   Component Value Date    PT 14.1 (H) 09/10/2020    INR 1.21 09/10/2020    APTT 31.4 09/11/2020       Erlinda Hong, PA

## 2020-11-19 NOTE — Unmapped (Signed)
Biliary/Advanced Endoscopy Consult Service  Initial Consultation    Reason for Consultation:   Pt is seen in consultation at the request of [name], MD (Oncology/Hematology ([facility]DE)) for assistance in management of elevated bilirubin.    Assessment and Recommendations:   Ivan Banks is a 59 y.o. 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    Assessment:  In the setting of this patient's known metastatic disease, the recent elevation of his liver enzymes in cholestatic pattern with Tbili now 5.3 and recent imaging findings are certainly concerning for potential biliary obstruction. The most likely etiology of biliary obstruction would be progression of metastatic pancreatic implant as there seems to have been some growth between recent scans, but the differential for lab abnormalities includes progressive lymphadenopathy leading to obstruction and also malignant infiltration of liver parenchyma though that has not been seen on prior imaging. Since his prior imaging has not shown common bile duct dilation, interval imaging with MRCP will be helpful to further evaluate biliary system and also assess for potential metastatic infiltration of liver.  Imaging has been negative for any gallstone disease. If MRCP shows malignant obstruction, may consider EUS/ERCP with potential stent placement as early as 12/30. While pending imaging results, would tentatively plan on potential procedure tomorrow with recs as below.    Recommendations:  -trend CMP  -check INR  -MRCP  -based on imaging, may consider EUS/ERCP on 12/30 (in case there is need for procedure, please see pre-procedure recs below including holding heparin gtt at 2am 12/30)    -GI Pre-Procedure Checklist:  Procedure: ERCP  Anticoagulation: Apixaban (last dose 12/28 PM) should be held currently and please hold heparin starting at 2am 12/30 in case patient comes for earliest procedure time at 8am  Diet: Please make NPO after midnight    Thank you for involving [place] in the care of your patient. We will continue to follow along with you. This patient was discussed with Dr. Penny Pia.     For questions, please contact the on-call fellow for the [facility] Endoscopy Consult Service at [phone].     Luanne Bras, MD  [facility] Gastroenterology and Hepatology Fellow    Subjective:   HPI:  Ivan Banks is a 59 y.o. 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    This patient is followed by Dr. Dorna Mai for his undifferentiated sarcoma w/ mets to lung and abdomen. First diagnosed 82/2020.  He has had progression on olaparib, progression on doxorubicin second line, recently started on third line pembolizumab (C1D1 12/21). Though his oncologist thinks it is too early to know what his response to Pembrolizumab will be, the rise in his bilirubin noted in clinic to be >5 was concerning so he has been admitted for expedited GI evaluation for potential biliary obstruction. He is anticoagulated on apixaban for DVT which was last given 12/28 PM, but antiplatelet agents.     Patient reports that he has had some intermittent chronic epigastric abdominal and back pain over recent months.  Over the last 2 weeks, he has noticed some worse pain in his right upper quadrant and epigastric pain that has become persistent though difficult to describe. He has also noticed decreased appetite and  early satiety. Noticed yellowing of eyes. Has had bilateral lower extremity edema. Denies nausea, vomiting, diarrhea, fevers. Has had normal stools.  No medication changes recently aside from starting new chemotherapy. No prior EGD. His mother had GI cancer though unsure which one. No tobacco, ETOH use though does use MJ.    On admission, he was afebrile and HD stable. Labs notable for stable macrocytic anemia, hypoNa, Tbili 5.3 from 1.9 on 12/21 (normal prior), AST stable elevation in ~70s over last month, ALT in 30s, and Alk phos that has increaed to 500s over past month.  Since at least 04/2020, his imaging noted pancreatic duct dilatation in s/o metastatic implant in pancreatic neck. At that time, noted to be 6mm though on subsequent scans it increased in size and has been stable around ~18mm on recent imaging. His most recent CT from 12/13 does not comment on CBD dilation though his [place] from this admission does comment on CBD dilation. That scan also noted overall increase in multiple metastatic lesions and LAD, hydropic gallbladder w/o stones likely 2/2 obstruction from pancreatic neck lesion (which also was noted to be 2.6cm from 2.1cm).  [place] on admission w/o stones.      Allergies:  Venom-honey bee, Aspirin, Gabapentin, and Morphine  Medications:   Prior to Admission medications    Medication Dose, Route, Frequency   EPINEPHrine (EPIPEN) 0.3 mg/0.3 mL injection 0.3 mg, Intramuscular, Once   fluoride, sodium, (CLINPRO 5000) 1.1 % Pste Dental, Daily (standard), Brush on teeth twice daily, do not rinse, spit out excess leave on teeth for at least one hr before eating or drinking   lisinopriL (PRINIVIL,ZESTRIL) 20 MG tablet 20 mg, Oral, Daily (standard)   metFORMIN (GLUCOPHAGE-XR) 500 MG 24 hr tablet 1,000 mg, Oral, Daily   polyethylene glycol (MIRALAX) 17 gram/dose powder 17 g, Oral, 2 times a day PRN   silver sulfaDIAZINE (SILVADENE, SSD) 1 % cream Apply to affected area daily   apixaban (ELIQUIS) 5 mg Tab 5 mg, Oral, 2 times a day (standard)   blood sugar diagnostic (GLUCOSE BLOOD) Strp Use to check blood sugar as directed with insulin 3 times a day & for symptoms of high or low blood sugar.   blood-glucose meter kit Use as instructed blood-glucose meter kit Use as instructed   cholecalciferol, vitamin D3-10 mcg, 400 unit,, 10 mcg (400 unit) cap 10 mcg, Daily before breakfast  Patient not taking: Reported on 11/18/2020   DULoxetine (CYMBALTA) 30 MG capsule 30 mg, Oral, Every morning  Patient not taking: Reported on 11/11/2020   insulin glargine (LANTUS SOLOSTAR U-100 INSULIN) 100 unit/mL (3 mL) injection pen 15 Units, Subcutaneous, Daily (standard)  Patient not taking: Reported on 11/18/2020   lancets (FREESTYLE) 28 gauge Misc Test daily before all meals/snacks and once before bedtime.   lancets (FREESTYLE) 28 gauge Misc Test daily before all meals/snacks and once before bedtime.   lancets Misc Use to check blood sugar as directed with insulin 3 times a day & for symptoms of high or low blood sugar.   naloxone (NARCAN) 4 mg nasal spray One spray in either nostril once for known/suspected opioid overdose. May repeat every 2-3 minutes in alternating nostril til EMS arrives   pen needle, diabetic 31 gauge x 1/4 (6 mm) Ndle Use for insulin administration daily   zinc gluconate 50 mg (7 mg elemental zinc) tablet 50 mg, Daily (standard)  Patient not taking: Reported on 11/18/2020     Medical History:  Past Medical History:   Diagnosis  Date   ??? Anemia    ??? Anxiety    ??? Cancer (CMS-HCC)    ??? Chronic back pain    ??? Diabetes mellitus (CMS-HCC) 08/08/2020   ??? Hypertension    ??? Substance abuse (CMS-HCC)      Surgical History:  Past Surgical History:   Procedure Laterality Date   ??? IR INSERT PORT AGE GREATER THAN 5 YRS  08/12/2020    IR INSERT PORT AGE GREATER THAN 5 YRS 08/12/2020 Jobe Gibbon, MD IMG VIR [facility]   ??? KNEE ARTHROSCOPY Left 1990     Social History:  Social History     Tobacco Use   ??? Smoking status: Never Smoker   ??? Smokeless tobacco: Never Used   Vaping Use   ??? Vaping Use: Never used   Substance Use Topics   ??? Alcohol use: Not Currently     Alcohol/week: 84.0 standard drinks     Types: 84 Cans of beer per week   ??? Drug use: Yes Frequency: 21.0 times per week     Types: Marijuana, Benzodiazepines     Comment: Smokes 2-3x a day     Family History:  Family History   Problem Relation Age of Onset   ??? Cancer Mother    ??? Diabetes Father    ??? Kidney disease Father    ??? Diabetes Sister    ??? Lupus Sister    ??? No Known Problems Brother      Review of Systems:  10 systems were reviewed and are negative unless otherwise mentioned in the HPI    Objective:   Physical Exam:  Temp:  [36.4 ??C-37 ??C] 36.9 ??C  Heart Rate:  [80-96] 86  Resp:  [12-28] 18  BP: (120-151)/(66-85) 120/66  SpO2:  [93 %-100 %] 98 %  Wt Readings from Last 3 Encounters:   11/18/20 72.6 kg (160 lb)   11/11/20 74.6 kg (164 lb 7.4 oz)   11/11/20 74.6 kg (164 lb 7.4 oz)     Gen: NAD  HEENT: Conjunctiva icteric  CV: RRR  Resp: Breathing comfortably  Abdomen: Soft/tender in RUQ and epigastric area/nd, no rebound/guarding  MSK/Ext: Warm, well perfused  Psych/Neuro: Alert & oriented.     Labs/Studies:  Labs, studies, and imaging from the last 24hrs per EMR and personally reviewed.    Prior GI Procedures / Endoscopy:  None in our system (colonoscopy in 2009, but he cannot recall results)    Abdominal imaging studies:  11/19/20 Liver Doppler  LIVER: The liver is normal in echogenicity. No focal hepatic lesions. No intrahepatic biliary ductal dilatation. Dilated common bile duct measuring up to 1.0 cm, seen on prior.  ??  GALLBLADDER: The gallbladder is distended with a mobile layering sludge. No definite cholelithiasis. Sonographic Eulah Pont sign is negative.   Trace pericholecystic fluid. Mild gallbladder wall thickening.  ??  PANCREAS: Dilated main pancreatic duct measuring up to 1.0 cm similar to prior CT dated 11/03/2020. The pancreatic neck metastatic implant is partially visualized measuring approximately 2.8 cm, better seen on prior CT.    IMPRESSION:  -Distended gallbladder with mild gallbladder wall thickening. No evidence of cholelithiasis. No sonographic Murphy sign.  -Dilated common bile duct and main pancreatic duct similar to prior CT dated 11/03/2020. The pancreatic neck mass is partially visualized and better seen on prior CT.   -Trace pericholecystic fluid or ascites.  -Multiple metastatic retroperitoneal implants, which are better characterized on prior CT.  -Bilateral pleural effusions and trace ascites, seen  on prior CT.  -Hepatomegaly. Patent hepatic vasculature with normal flow direction.    11/03/20 CTAP  HEPATOBILIARY: No focal hepatic lesions. Hydropic without radiopaque gallstones, similar to prior. No biliary dilatation.    SPLEEN: Unremarkable.  PANCREAS: Dilatation of the main pancreatic duct up to 1.0 cm secondary to a 2.6 cm metastatic implant within the pancreatic neck (1:46), previously 2.1 cm, similar to prior.  Since 09/23/2020   -Multiple metastatic lesions throughout the abdomen and pelvis with associated lymphadenopathy overall increased compared to prior.  -Small-volume ascites, likely reactive versus malignant.  -Hydropic gallbladder without evidence of radiopaque gallstones, likely secondary to obstruction from metastatic soft tissue lesion in the pancreatic neck. However, if there is clinical concern for cholelithiasis, right upper quadrant ultrasound is recommended.  - Increased focal narrowing at the confluence of the superior mesenteric vein and portal vein and new focal occlusion of the confluence of the splenic vein and portal vein due to compression by adjacent metastatic lesions  -See separately dictated CT of the chest for findings above the diaphragm.    09/23/20 CTAP  PANCREAS: There is a similar appearance to prior study of main pancreatic ductal dilatation distal to metastatic implant in the pancreatic neck, which measures approximately 2.1 cm (5:52), previously 2.1 cm.    05/12/2020 CTAP    Pancreas: There is a heterogeneous ill-defined lesion in the  posterior pancreatic head measuring approximately 1.3 by 0.9 cm.  There is diffuse pancreatic ductal dilatation measuring up to 6 mm.  Also within the pancreatic tail there is an ill-defined hypodense  lesion measuring 2.4 by 1.3 cm.

## 2020-11-19 NOTE — Unmapped (Signed)
Pt alert and oriented. VSS, no falls and afebrile.  No acute events overnight. Pt complained of abdominal pain, right mouth pain and right arm pain.  Upon assessment pt stated that he had fallen approximately 2 weeks ago and broken a few ribs.  Pt has a lesion on his right inner cheek.  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  Pt also has a palpable mass to the right of his umbilicus.  Pt complained of pain and received PRN pain medications x 2 overnight in addition to his scheduled pain medication.  Pt remains on room air with good results.  Pt remains on room air with good results.  Safety measures remain in place with bed alarm on.  Will continue to monitor.    Vitals:    11/18/20 1855 11/18/20 1933 11/18/20 2312 11/19/20 0332   BP: 144/83 141/80 145/82 134/73   Pulse: 96 96 96 93   Resp: 17 28 20 12    Temp: 36.7 ??C (98.1 ??F) 36.7 ??C (98.1 ??F) 36.7 ??C (98.1 ??F) 36.7 ??C (98.1 ??F)   TempSrc: Oral Oral Oral Oral   SpO2: 100% 93% 94% 96%      Problem: Adult Inpatient Plan of Care  Goal: Plan of Care Review  Outcome: Progressing  Flowsheets (Taken 11/19/2020 0439)  Progress: no change  Plan of Care Reviewed With: patient  Goal: Patient-Specific Goal (Individualized)  Outcome: Progressing  Flowsheets (Taken 11/19/2020 0439)  Patient-Specific Goals (Include Timeframe): Patient would like to get his pain under control and get some sleep overnight.  Individualized Care Needs: Cluster care, empathetic listening, presence, provide PRN pain medications per [facility] as needed.  Anxieties, Fears or Concerns: Patient is concerned about his recent fall and his SOB.  Goal: Absence of Hospital-Acquired Illness or Injury  Outcome: Progressing  Intervention: Identify and Manage Fall Risk  Recent Flowsheet Documentation  Taken 11/18/2020 2000 by Weber Cooks, RN  Safety Interventions:   aspiration precautions   assistive device   bed alarm   commode/urinal/bedpan at bedside   environmental modification   fall reduction program maintained   lighting adjusted for tasks/safety   low bed   mobility aid   nonskid shoes/slippers when out of bed   room near unit station   supervised activity   toileting scheduled  Intervention: Prevent Skin Injury  Recent Flowsheet Documentation  Taken 11/18/2020 2000 by Weber Cooks, RN  Skin Protection: (See MAR)   adhesive use limited   protective footwear used   transparent dressing maintained  Intervention: Prevent and Manage VTE (Venous Thromboembolism) Risk  Recent Flowsheet Documentation  Taken 11/18/2020 2000 by Weber Cooks, RN  VTE Prevention/Management:   ambulation promoted   anticoagulant therapy   bleeding precautions maintained   bleeding risk factors identified   dorsiflexion/plantar flexion performed   fluids promoted  Goal: Optimal Comfort and Wellbeing  Outcome: Progressing  Goal: Readiness for Transition of Care  Outcome: Progressing  Goal: Rounds/Family Conference  Outcome: Progressing     Problem: Fall Injury Risk  Goal: Absence of Fall and Fall-Related Injury  Outcome: Progressing  Intervention: Identify and Manage Contributors  Recent Flowsheet Documentation  Taken 11/18/2020 2000 by Weber Cooks, RN  Self-Care Promotion:   independence encouraged   BADL personal objects within reach   BADL personal routines maintained  Intervention: Promote Injury-Free Environment  Recent Flowsheet Documentation  Taken 11/18/2020 2000 by Weber Cooks, RN  Safety Interventions:   aspiration precautions  assistive device   bed alarm   commode/urinal/bedpan at bedside   environmental modification   fall reduction program maintained   lighting adjusted for tasks/safety   low bed   mobility aid   nonskid shoes/slippers when out of bed   room near unit station   supervised activity   toileting scheduled     Problem: Self-Care Deficit  Goal: Improved Ability to Complete Activities of Daily Living  Outcome: Progressing  Intervention: Promote Activity and Functional Independence  Recent Flowsheet Documentation  Taken 11/18/2020 2000 by Weber Cooks, RN  Self-Care Promotion:   independence encouraged   BADL personal objects within reach   BADL personal routines maintained     Problem: Impaired Wound Healing  Goal: Optimal Wound Healing  Outcome: Progressing     Problem: Pain Acute  Goal: Acceptable Pain Control and Functional Ability  Outcome: Progressing

## 2020-11-19 NOTE — Unmapped (Signed)
Oncology (MEDO) History & Physical    Assessment & Plan:   Ivan Banks is a 59 y.o. 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     Principal Problem:    Elevated bilirubin  Active Problems:    Metastatic sarcoma (CMS-HCC)    Right arm pain    Acute deep vein thrombosis (DVT) of brachial vein of right upper extremity (CMS-HCC)    Hydrops of gallbladder    Bilateral lower extremity edema    Hyponatremia  Resolved Problems:    * No resolved hospital problems. *      Abdominal Fullness - Elevated Bilirubin: 1-2 weeks of progressive malaise, decreased appetite, weakness, and abdominal fullness, noted to have up-trending bilirubin in outpatient setting to 5.1 (direct 4.20) in the setting of heavy metastatic disease burden in the abdomen and pelvis. LFTs mildly elevated but stable. Low concern for cholecystitis or cholangitis at this time given absent leukocytosis, fevers, and negative Murphey's sign. Highest suspicion for biliary obstruction, likely 2/2 metastatic disease.   - US Abdomen Complete  - Will likely need MRCP  - Repeat CMP w/direct bili  - Plan for consult to biliary GI pending imaging    Lower Extremity Edema: Patient with impressive bilateral lower extremity edema that he states is worse in the last couple weeks.  Given his abdominal fullness, hepatomegaly, and metastatic burden, I worry about extrinsic IVC compression.  Also on the differential is hepatic/portal vein/IVC thrombus (on home [facility]), anasarca (hypoalbuminemic, although would expect more generalized edema), heart failure.  -Abdominal ultrasound as above with doppler  -Consider cross sectional imaging    Hyponatremia: Sodium noted to be progressively decreasing down to 129 prior to admission from normal of 136.  May be low solute intake due to poor p.o. intake and presenting symptoms, although significant volume overload may suggest a component of hypervolemic hyponatremia.  -Repeat labs on admission  -Urine studies  -TSH    Metastatic Sarcoma: S/p palliative XRT, progression on olaparib, progression on doxorubicin second line, recently started on third line pembolizumab (C1D1 12/21). Follows with Dr. Dorna Banks. Also with new mass lesion in the R inner cheek c/f new metastatic lesion.   - Consider consult to ENT vs cross sectional imaging    DVT of Right Mid Brachial Vein: Diagnosed in September, 2021 on Eliquis, but also with extrinsic compression and luminal narrowing of right brachiocephalic vein.  Exam with continued tightness in RUE c/f mechanical obstruction.   -Upcoming   -Last Eliquis 12/29 (PM)  -Transition to heparin gtt in AM in anticipation of possible GI intervention    Chemotherapy Induced Type I DM: Last Hgb A1c 11.7, previously insulin-dependent, but with improving glycemic control and no longer on insulin.  -Hold home metformin  -Sliding scale insulin    Chronic Pain: Follows with chronic pain.  -Continue oxycodone ER 20 mg BID in place of home Xtampza ER 18 mg twice daily.  -Oxycodone 10 mg every 8 hours as needed TO oxycodone 10 mg every 6 hours as needed.  -Stop gabapentin-produce dizziness.  -Start Cymbalta 30 mg every morning.    Daily Checklist:  Diet: NPO and Regular Diet  DVT PPx: Patient Already on Full Anticoagulation with eliquis  Electrolytes: Replete Potassium to >/= 3.6 and Magnesium to >/= 1.8  Code Status: Full Code    Chief Concern:   Elevated bilirubin    Subjective:  HPI:  Ivan Banks is a 59 y.o. 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     Patient reports that approximately 1 week ago he developed new symptoms including decreased appetite, malaise, weakness, and shortness of breath that have been slowly progressive over the last 7 days.  He endorses some chills at home, but denies any fevers.  He also endorses new onset swelling, most significant in his bilateral lower extremities, that has been going on for 2 weeks.  He notes that he has been having increased abdominal fullness, but is still passing gas and having regular bowel movements.  He notes a lump in the inside of his mouth for about 7 days.  He also notes a lump in his lower abdomen that seems to be worse over the last several days.    As a result of his symptoms, he states that he has been eating less and drinking less.  He normally eats about 6 small meals a day and has only been having 3-4.  He also endorses some early satiety.    Of interest, patient reports that 2 weeks ago he was feeding his goats when he was tripped up and fell with his hand over his chest, sustaining a right sixth rib fracture.  He has had some chest pain and shortness of breath as a result, but it feels slightly different now compared to what he was experiencing before.    Designated Environmental health practitioner:  Ivan Banks currently has decisional capacity for healthcare decision-making and is able to designate a surrogate healthcare decision maker. Ivan Banks designated healthcare decision maker(s) is/are Ivan Banks (the patient's fiance) as denoted by stated patient preference.    Allergies:  Venom-honey bee, Aspirin, Gabapentin, and Morphine    Medications:   Prior to Admission medications    Medication Dose, Route, Frequency   EPINEPHrine (EPIPEN) 0.3 mg/0.3 mL injection 0.3 mg, Intramuscular, Once   fluoride, sodium, (CLINPRO 5000) 1.1 % Pste Dental, Daily (standard), Brush on teeth twice daily, do not rinse, spit out excess leave on teeth for at least one hr before eating or drinking   lisinopriL (PRINIVIL,ZESTRIL) 20 MG tablet 20 mg, Oral, Daily (standard)   metFORMIN (GLUCOPHAGE-XR) 500 MG 24 hr tablet 1,000 mg, Oral, Daily   polyethylene glycol (MIRALAX) 17 gram/dose powder 17 g, Oral, 2 times a day PRN   silver sulfaDIAZINE (SILVADENE, SSD) 1 % cream Apply to affected area daily   apixaban (ELIQUIS) 5 mg Tab 5 mg, Oral, 2 times a day (standard)   blood sugar diagnostic (GLUCOSE BLOOD) Strp Use to check blood sugar as directed with insulin 3 times a day & for symptoms of high or low blood sugar.   blood-glucose meter kit Use as instructed   blood-glucose meter kit Use as instructed   cholecalciferol, vitamin D3-10 mcg, 400 unit,, 10 mcg (400 unit) cap 10 mcg, Daily before breakfast  Patient not taking: Reported on 11/18/2020   DULoxetine (CYMBALTA) 30 MG capsule 30 mg, Oral, Every morning  Patient not taking: Reported on 11/11/2020   insulin glargine (LANTUS SOLOSTAR U-100 INSULIN) 100 unit/mL (3 mL) injection pen 15 Units, Subcutaneous, Daily (standard)  Patient not taking: Reported on 11/18/2020   lancets (FREESTYLE) 28 gauge Misc Test daily before all meals/snacks and once before bedtime.   lancets (FREESTYLE) 28 gauge Misc Test daily before all meals/snacks and once before  bedtime.   lancets Misc Use to check blood sugar as directed with insulin 3 times a day & for symptoms of high or low blood sugar.   naloxone (NARCAN) 4 mg nasal spray One spray in either nostril once for known/suspected opioid overdose. May repeat every 2-3 minutes in alternating nostril til EMS arrives   pen needle, diabetic 31 gauge x 1/4 (6 mm) Ndle Use for insulin administration daily   zinc gluconate 50 mg (7 mg elemental zinc) tablet 50 mg, Daily (standard)  Patient not taking: Reported on 11/18/2020       Medical History:  Past Medical History:   Diagnosis Date   ??? Anemia    ??? Anxiety    ??? Cancer (CMS-HCC)    ??? Chronic back pain    ??? Diabetes mellitus (CMS-HCC) 08/08/2020   ??? Hypertension    ??? Substance abuse (CMS-HCC)        Surgical History:  Past Surgical History:   Procedure Laterality Date   ??? IR INSERT PORT AGE GREATER THAN 5 YRS  08/12/2020    IR INSERT PORT AGE GREATER THAN 5 YRS 08/12/2020 Jobe Gibbon, MD IMG VIR H&V [facility]   ??? KNEE ARTHROSCOPY Left 1990       Family History:   Family History   Problem Relation Age of Onset   ??? Cancer Mother    ??? Diabetes Father    ??? Kidney disease Father    ??? Diabetes Sister    ??? Lupus Sister    ??? No Known Problems Brother        Social History:  The patient lives with family    Social History     Tobacco Use   ??? Smoking status: Never Smoker   ??? Smokeless tobacco: Never Used   Vaping Use   ??? Vaping Use: Never used   Substance Use Topics   ??? Alcohol use: Not Currently     Alcohol/week: 84.0 standard drinks     Types: 84 Cans of beer per week   ??? Drug use: Yes     Frequency: 21.0 times per week     Types: Marijuana, Benzodiazepines     Comment: Smokes 2-3x a day        Review of Systems:  10 systems were reviewed and are negative unless otherwise mentioned in the HPI    Objective:   Physical Exam:  Temp:  [36.4 ??C-36.7 ??C] 36.7 ??C  Heart Rate:  [80-96] 96  Resp:  [17-28] 20  BP: (141-150)/(68-83) 145/82  SpO2:  [93 %-100 %] 94 %    Gen: Comfortable in NAD, answers questions appropriately  Eyes: Sclera anicteric, EOMI, PERRLA,  HENT: atraumatic, normocephalic, MMM. OP w/o erythema or exudate, solid mass with purplish discoloration of the right inner cheek, non-tender. Poor dentition.    Neck: no cervical lymphadenopathy or thyromegaly, no JVD  Heart: RRR, S1, S2, no M/R/G, mild tenderness to palpation on right upper chest  Lungs: CTAB, bibasilar crackles, no use of accessory muscles  Abdomen: hypoactive bowel sounds, soft, NTND, no rebound/guarding, massive hepatomegaly with palpable mass in RLQ  Extremities: no clubbing or cyanosis: difficult to palpate peripheral pulses but warm. Tense 2+ edema in RUE distal to mid arm, 2+ pitting edema of BLE.   Neuro: CN II-XI grossly intact, normal cerebellar function, normal gait. No focal deficits.  Skin:  No rashes, solid raised mass on right forearm  Psych: Alert, oriented, normal mood and affect.     Labs/Studies/Imaging:  Labs, Studies, Imaging from the last 24hrs per EMR and personally reviewed

## 2020-11-19 NOTE — Unmapped (Signed)
PHYSICAL THERAPY  Evaluation (11/19/20 0922)     Patient Name:  Ivan Banks       Medical Record Number: [id number]   Date of Birth: October 12, 1961  Sex: Male            Treatment Diagnosis: Decreased activity tolerance, bilateral LE sensitivity, impaired gait quality    Activity Tolerance: Tolerated treatment well    ASSESSMENT  Problem List: Decreased mobility,Pain,Decreased endurance,Decreased strength,Fall Risk     Assessment : Patient is a 59 y.o. 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 Patient presents today with deficits in endurance, strength, and gait quality, reporting increased sensitivity to bilateral LEs which improves with use of RW, and patient will benefit from acute PT intervention to address these impairments and to safely progress with mobility. Based on the AM-PAC six item raw score of 20/24, the patient is considered to be 33.32% impaired with basic mobility. This indicates that the patient may benefit from PT f/u of 3x/week in order to ensure safe mobility within the home environment and return to PLOF. After a review of the personal factors, comorbidities, clinical presentation, and examination of the number of affected body systems, the patient presents as a low complexity case.     Today's Interventions: Patient educated on role of PT, PT POC, PT goals, progression of mobility, having increased time OOB to chair, safe use of RW for transfers and ambulation, use of LE exercises and providing light touch input to bilateral feet for desensitization, and calling for RN staff assistance for safety with OOB mobility.                          PLAN  Planned Frequency of Treatment:  1-2x per day for: 2-3x week      Planned Interventions: Balance activities,Diaphragmatic / Pursed-lip breathing,Education - Patient,Education - Family / caregiver,Endurance activities,Functional mobility,Gait training,Home exercise program,Postural re-education,Self-care / Home training,Therapeutic exercise,Therapeutic activity,Neuromuscular re-education,Transfer training    Post-Discharge Physical Therapy Recommendations:  3x weekly    PT DME Recommendations: Walker (rolling),Three in one commode           Goals:   Patient and Family Goals: To improve endurance    Long Term Goal #1: In 5 weeks, patient will ambulate >600 ft modified independently using LRAD in order to increase independence with community mobility       SHORT GOAL #1: Patient will perform all functional transfers modified independently using LRAD              Time Frame : 1 week  SHORT GOAL #2: Patient will ambulate 150 ft modified independently using LRAD              Time Frame : 1 week  SHORT GOAL #3: Patient will negotiate up/down 1 step without handrails with SBA using LRAD              Time Frame : 1 week                                        Prognosis:  Good  Positive Indicators: Participation, PLOF  Barriers to Discharge: Endurance deficits,Pain    SUBJECTIVE     Patient reports: Patient and RN agreeable to PT, reports recent fall ~2 weeks  ago resulting in right 6th rib fracture  Current Functional Status: Patient received semi-reclined in bed with HOB elevated, ended session reclined in bedside chair, lines intact, set-up for breakfast, RN and NA aware.     Prior Functional Status: Prior to admission, patient reports ambulating community distances independently with no device, reports x1 fall ~2 weeks ago when tripping over a wire which resulted in a right 6th rib fracture. Patient reports being independent with ADLs. Patient reports he has goats and chickens which he enjoys tending to.  Equipment available at home: Other (walking stick)     Past Medical History:   Diagnosis Date   ??? Anemia    ??? Anxiety    ??? Cancer (CMS-HCC)    ??? Chronic back pain    ??? Diabetes mellitus (CMS-HCC) 08/08/2020   ??? Hypertension ??? Substance abuse (CMS-HCC)     Social History     Tobacco Use   ??? Smoking status: Never Smoker   ??? Smokeless tobacco: Never Used   Substance Use Topics   ??? Alcohol use: Not Currently     Alcohol/week: 84.0 standard drinks     Types: 84 Cans of beer per week      Past Surgical History:   Procedure Laterality Date   ??? IR INSERT PORT AGE GREATER THAN 5 YRS  08/12/2020    IR INSERT PORT AGE GREATER THAN 5 YRS 08/12/2020 Jobe Gibbon, MD IMG VIR H&V [facility]   ??? KNEE ARTHROSCOPY Left 1990    Family History   Problem Relation Age of Onset   ??? Cancer Mother    ??? Diabetes Father    ??? Kidney disease Father    ??? Diabetes Sister    ??? Lupus Sister    ??? No Known Problems Brother         Allergies: Venom-honey bee, Aspirin, Gabapentin, and Morphine                Objective Findings  Precautions / Restrictions  Precautions: Falls precautions  Weight Bearing Status: Non-applicable  Required Braces or Orthoses: Non-applicable    Communication Preference: Verbal   Pain Comments: Patient denies pain at rest, however reports bilateral LE discomfort (described as a sensitivity) with standing, no numerical value provided, RN made aware  Medical Tests / Procedures: Labs, orders, and imaging reviewed in Epic.  Equipment / Environment: Patient wearing mask for full session,Vascular access (PIV, TLC, Port-a-cath, PICC)    At Rest: VSS per Epic  With Activity: Pt NAD  Orthostatics: Pt asymptomatic throughout session       Living Situation  Living Environment: House  Lives With: Significant other  Home Living: One level home,Stairs to enter without rails,Tub/shower unit  Number of Stairs to Enter (outside): 1     Cognition  Cognition: WFL  Visual / Perception status  Visual/Perception: Within Functional Limits    UE ROM / Strength  UE ROM/Strength: Left WFL,Right WFL  LE ROM / Strength  LE ROM/Strength: Left Impaired/Limited,Right Impaired/Limited  RLE Impairment: Reduced strength  LLE Impairment: Reduced strength  LE comment: Grossly 4/5 throughout bilateral LEs    Motor/ Sensory/ Neuro  Sensation comment: Patient reports light touch sensation is intact to bilateral UEs/LEs; reports increased sensitivity to plantar aspect of bilateral feet when standing  Balance comment: Patient sits unsupported EOB independently; patient stands with use of RW with good standing balance during dynamic tasks          Transfers  Transfers: Supine to Sit,Sit to  Stand  Supine to Sit assistance level: Independent  Supine to Sit comments: Patient transitions supine to sit modified independently due to [facility] partially elevated  Sit to Stand comments: Patient transfers sit to/from stand with SBA using RW, verbal cues for safe UE placement; pt with decreased eccentric control with stand to sit transfer     Gait  Assistive Device: Front wheel walker  Distance Ambulated (ft): 25 ft  Gait: Patient ambulates 25 ft with RW and SBA for safety, verbal cues provided for safe RW management.    Stairs: Not assessed          Endurance: Impaired, patient reports ambulation distance limited by c/o bilateral LE sensitivity with standing    Physical Therapy Session Duration  PT Individual [mins]: 25    Medical Staff Made Aware: RN Sarah aware of session outcome    I attest that I have reviewed the above information.  Signed: Fernanda Drum, PT  Filed 11/19/2020

## 2020-11-20 DIAGNOSIS — K831 Obstruction of bile duct: Principal | ICD-10-CM

## 2020-11-20 DIAGNOSIS — Z6820 Body mass index (BMI) 20.0-20.9, adult: Principal | ICD-10-CM

## 2020-11-20 DIAGNOSIS — C4912 Malignant neoplasm of connective and soft tissue of left upper limb, including shoulder: Principal | ICD-10-CM

## 2020-11-20 DIAGNOSIS — I110 Hypertensive heart disease with heart failure: Principal | ICD-10-CM

## 2020-11-20 DIAGNOSIS — C7989 Secondary malignant neoplasm of other specified sites: Principal | ICD-10-CM

## 2020-11-20 DIAGNOSIS — C7800 Secondary malignant neoplasm of unspecified lung: Principal | ICD-10-CM

## 2020-11-20 DIAGNOSIS — Z79899 Other long term (current) drug therapy: Principal | ICD-10-CM

## 2020-11-20 DIAGNOSIS — K9189 Other postprocedural complications and disorders of digestive system: Principal | ICD-10-CM

## 2020-11-20 DIAGNOSIS — I5022 Chronic systolic (congestive) heart failure: Principal | ICD-10-CM

## 2020-11-20 DIAGNOSIS — E099 Drug or chemical induced diabetes mellitus without complications: Principal | ICD-10-CM

## 2020-11-20 DIAGNOSIS — I82621 Acute embolism and thrombosis of deep veins of right upper extremity: Principal | ICD-10-CM

## 2020-11-20 DIAGNOSIS — K7689 Other specified diseases of liver: Principal | ICD-10-CM

## 2020-11-20 DIAGNOSIS — E43 Unspecified severe protein-calorie malnutrition: Principal | ICD-10-CM

## 2020-11-20 DIAGNOSIS — Z20822 Contact with and (suspected) exposure to covid-19: Principal | ICD-10-CM

## 2020-11-20 DIAGNOSIS — Z7901 Long term (current) use of anticoagulants: Principal | ICD-10-CM

## 2020-11-20 DIAGNOSIS — T451X5A Adverse effect of antineoplastic and immunosuppressive drugs, initial encounter: Principal | ICD-10-CM

## 2020-11-20 DIAGNOSIS — R600 Localized edema: Principal | ICD-10-CM

## 2020-11-20 DIAGNOSIS — M549 Dorsalgia, unspecified: Principal | ICD-10-CM

## 2020-11-20 DIAGNOSIS — Z7984 Long term (current) use of oral hypoglycemic drugs: Principal | ICD-10-CM

## 2020-11-20 DIAGNOSIS — E871 Hypo-osmolality and hyponatremia: Principal | ICD-10-CM

## 2020-11-20 DIAGNOSIS — C4911 Malignant neoplasm of connective and soft tissue of right upper limb, including shoulder: Principal | ICD-10-CM

## 2020-11-20 DIAGNOSIS — G893 Neoplasm related pain (acute) (chronic): Principal | ICD-10-CM

## 2020-11-20 LAB — CBC W/ AUTO DIFF
BASOPHILS ABSOLUTE COUNT: 0 10*9/L (ref 0.0–0.1)
BASOPHILS RELATIVE PERCENT: 0.5 %
EOSINOPHILS ABSOLUTE COUNT: 0.1 10*9/L (ref 0.0–0.4)
EOSINOPHILS RELATIVE PERCENT: 1.1 %
HEMATOCRIT: 28.5 % — ABNORMAL LOW (ref 41.0–53.0)
HEMOGLOBIN: 8.7 g/dL — ABNORMAL LOW (ref 13.5–17.5)
LARGE UNSTAINED CELLS: 3 % (ref 0–4)
LYMPHOCYTES ABSOLUTE COUNT: 0.6 10*9/L — ABNORMAL LOW (ref 1.5–5.0)
LYMPHOCYTES RELATIVE PERCENT: 8 %
MEAN CORPUSCULAR HEMOGLOBIN CONC: 30.6 g/dL — ABNORMAL LOW (ref 31.0–37.0)
MEAN CORPUSCULAR HEMOGLOBIN: 33.4 pg (ref 26.0–34.0)
MEAN CORPUSCULAR VOLUME: 109.2 fL — ABNORMAL HIGH (ref 80.0–100.0)
MEAN PLATELET VOLUME: 9.7 fL (ref 7.0–10.0)
MONOCYTES ABSOLUTE COUNT: 0.4 10*9/L (ref 0.2–0.8)
MONOCYTES RELATIVE PERCENT: 6.1 %
NEUTROPHILS ABSOLUTE COUNT: 5.7 10*9/L (ref 2.0–7.5)
NEUTROPHILS RELATIVE PERCENT: 81.7 %
PLATELET COUNT: 466 10*9/L — ABNORMAL HIGH (ref 150–440)
RED BLOOD CELL COUNT: 2.61 10*12/L — ABNORMAL LOW (ref 4.50–5.90)
RED CELL DISTRIBUTION WIDTH: 16.3 % — ABNORMAL HIGH (ref 12.0–15.0)
WBC ADJUSTED: 7 10*9/L (ref 4.5–11.0)

## 2020-11-20 LAB — COMPREHENSIVE METABOLIC PANEL
ALBUMIN: 2.4 g/dL — ABNORMAL LOW (ref 3.4–5.0)
ALKALINE PHOSPHATASE: 534 U/L — ABNORMAL HIGH (ref 46–116)
ALT (SGPT): 31 U/L (ref 10–49)
ANION GAP: 7 mmol/L (ref 5–14)
AST (SGOT): 68 U/L — ABNORMAL HIGH (ref <=34)
BILIRUBIN TOTAL: 5.6 mg/dL — ABNORMAL HIGH (ref 0.3–1.2)
BLOOD UREA NITROGEN: 29 mg/dL — ABNORMAL HIGH (ref 9–23)
BUN / CREAT RATIO: 48
CALCIUM: 9.5 mg/dL (ref 8.7–10.4)
CHLORIDE: 99 mmol/L (ref 98–107)
CO2: 22 mmol/L (ref 20.0–31.0)
CREATININE: 0.61 mg/dL
EGFR CKD-EPI AA MALE: >90 mL/min/{1.73_m2} (ref >=60)
EGFR CKD-EPI NON-AA MALE: >90 mL/min/{1.73_m2} (ref >=60)
GLUCOSE RANDOM: 163 mg/dL (ref 70–179)
POTASSIUM: 4.4 mmol/L (ref 3.4–4.5)
PROTEIN TOTAL: 7 g/dL (ref 5.7–8.2)
SODIUM: 128 mmol/L — ABNORMAL LOW (ref 135–145)

## 2020-11-20 LAB — MAGNESIUM: MAGNESIUM: 1.6 mg/dL (ref 1.6–2.6)

## 2020-11-20 LAB — PHOSPHORUS: PHOSPHORUS: 3.8 mg/dL (ref 2.4–5.1)

## 2020-11-20 LAB — OSMOLALITY, SERUM: OSMOLALITY MEASURED: 283 mosm/kg (ref 275–295)

## 2020-11-20 MED ADMIN — midazolam (VERSED) injection: INTRAVENOUS | @ 16:00:00 | Stop: 2020-11-20

## 2020-11-20 MED ADMIN — HYDROmorphone (PF) (DILAUDID) injection 1 mg: 1 mg | INTRAVENOUS | @ 19:00:00 | Stop: 2020-11-21

## 2020-11-20 MED ADMIN — vasopressin (VASOSTRICT) injection: INTRAVENOUS | @ 16:00:00 | Stop: 2020-11-20

## 2020-11-20 MED ADMIN — phenylephrine 0.8 mg/10 mL (80 mcg/mL) injection: INTRAVENOUS | @ 16:00:00 | Stop: 2020-11-20

## 2020-11-20 MED ADMIN — silver sulfaDIAZINE (SILVADENE, SSD) 1 % cream: TOPICAL | @ 19:00:00 | Stop: 2020-11-24

## 2020-11-20 MED ADMIN — neostigmine (BLOXIVERZ) injection: INTRAVENOUS | @ 17:00:00 | Stop: 2020-11-20

## 2020-11-20 MED ADMIN — succinylcholine (ANECTINE) injection: INTRAVENOUS | @ 16:00:00 | Stop: 2020-11-20

## 2020-11-20 MED ADMIN — lactated Ringers infusion: 10 mL/h | INTRAVENOUS | @ 16:00:00 | Stop: 2020-11-24

## 2020-11-20 MED ADMIN — propofoL (DIPRIVAN) injection: INTRAVENOUS | @ 16:00:00 | Stop: 2020-11-20

## 2020-11-20 MED ADMIN — lisinopriL (PRINIVIL,ZESTRIL) tablet 20 mg: 20 mg | ORAL | @ 02:00:00 | Stop: 2020-11-24

## 2020-11-20 MED ADMIN — oxyCODONE (OXYCONTIN) 12 hr crush resistant ER/CR tablet 20 mg: 20 mg | ORAL | @ 02:00:00 | Stop: 2020-11-24

## 2020-11-20 MED ADMIN — ePHEDrine (PF) 25 mg/5 mL (5 mg/mL) in 0.9% sodium chloride syringe Syrg: INTRAVENOUS | @ 16:00:00 | Stop: 2020-11-20

## 2020-11-20 MED ADMIN — HYDROmorphone (PF) (DILAUDID) injection 1 mg: 1 mg | INTRAVENOUS | @ 14:00:00 | Stop: 2020-11-21

## 2020-11-20 MED ADMIN — lidocaine (XYLOCAINE) 20 mg/mL (2 %) injection: INTRAVENOUS | @ 16:00:00 | Stop: 2020-11-20

## 2020-11-20 MED ADMIN — glycopyrrolate (ROBINUL) injection: INTRAVENOUS | @ 17:00:00 | Stop: 2020-11-20

## 2020-11-20 MED ADMIN — fentaNYL (PF) (SUBLIMAZE) injection: INTRAVENOUS | @ 16:00:00 | Stop: 2020-11-20

## 2020-11-20 MED ADMIN — ondansetron (ZOFRAN) injection: INTRAVENOUS | @ 17:00:00 | Stop: 2020-11-20

## 2020-11-20 MED ADMIN — sodium chloride (NS) 0.9 % flush 10 mL: 10 mL | INTRAVENOUS | @ 02:00:00 | Stop: 2020-11-24

## 2020-11-20 MED ADMIN — HYDROmorphone (PF) (DILAUDID) injection 1 mg: 1 mg | INTRAVENOUS | @ 02:00:00 | Stop: 2020-11-21

## 2020-11-20 MED ADMIN — sodium chloride (NS) 0.9 % flush 10 mL: 10 mL | INTRAVENOUS | @ 14:00:00 | Stop: 2020-11-24

## 2020-11-20 MED ADMIN — oxyCODONE (ROXICODONE) immediate release tablet 10 mg: 10 mg | ORAL | @ 23:00:00 | Stop: 2020-11-21

## 2020-11-20 MED ADMIN — ROCuronium (ZEMURON) injection: INTRAVENOUS | @ 16:00:00 | Stop: 2020-11-20

## 2020-11-20 NOTE — Unmapped (Signed)
Oncology (MEDO) Progress Note    Assessment & Plan:   Ivan Banks is a 59 y.o. 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    Principal Problem:    Elevated bilirubin  Active Problems:    Metastatic sarcoma (CMS-HCC)    Right arm pain    Acute deep vein thrombosis (DVT) of brachial vein of right upper extremity (CMS-HCC)    Hydrops of gallbladder    Bilateral lower extremity edema    Hyponatremia  Resolved Problems:    * No resolved hospital problems. *    Abdominal Fullness - Elevated Bilirubin:   Presented with 1-2 weeks of progressive malaise, decreased appetite, weakness, and abdominal fullness, noted to have up-trending bilirubin in outpatient setting to 5.1 (direct 4.20) in the setting of heavy metastatic disease burden in the abdomen and pelvis. No signs concerning for cholangitis. Abdominal U/S showed dilation of the common bile duct and main pancreatic duct which are additional findings concerning for obstruction. Plan to evaluate anatomy further with MRCP. Discussed with biliary team who are planning for ERCP tomorrow given high suspicion for malignant obstruction.    -- MRCP tonight   -- ECRP tomorrow  -- Appreciate biliary team recommendations   -- NPO at midnight   -- Heparin drip to end at 0200 tomorrow   -- Daily CMP and billi  ??  Lower Extremity Edema:   Patient with impressive bilateral lower extremity edema that he states is worse in the last couple weeks. Differential includes hepatic/portal vein/IVC thrombus (on home [facility]), anasarca (hypoalbuminemic, although would expect more generalized edema), heart failure. Liver doppler on admission showed patent hepatic vasculature. Last echo 9/21 showed normal LVEF and patient does not endorse any additional signs of HF. Unclear etiology at this time.    Metastatic Sarcoma: S/p palliative XRT, progression on olaparib, progression on doxorubicin second line, recently started on third line pembolizumab (C1D1 12/21). Follows with Dr. Dorna Mai. New intraoral lesion with CT imaging most consistent with metastatic disease as well as mets to the left upper neck and nasomaxillary junction.   ????  DVT of Right Mid Brachial Vein:   Diagnosed in September, 2021 on Eliquis, but also with extrinsic compression and luminal narrowing of right brachiocephalic vein.  Exam with continued tightness in RUE c/f mechanical obstruction.   -- Heparin drip in anticipation of procedure   ??  Chemotherapy Induced Type I DM:   Last Hgb A1c 11.7, previously insulin-dependent, but with improving glycemic control and no longer on insulin.  -Hold home metformin  -Sliding scale insulin  ??  Chronic Pain: Follows with chronic pain.  -Continue oxycodone ER 20 mg BID in place of home Xtampza ER 18 mg twice daily.  -Oxycodone 10 mg every 8 hours as needed, oxycodone 10 mg??every 6 hours as needed.  -Stop gabapentin-produce dizziness.  -Start Cymbalta 30 mg every morning.    Daily Checklist:  Diet: NPO at MN  DVT PPx: Heparin drip  Electrolytes: Replete Potassium to >/= 3.6 and Magnesium to >/= 1.8  Code Status: Full Code  Dispo: Admit to Med O    Team Contact Information:   Primary Team: Oncology (MEDO)  Primary Resident: Bernita Buffy, MD  Resident's Pager: [phone] (Hematology Senior Resident)    Interval History:   NOE. No complaints this morning. Had just returned from liver doppler.  Objective:     Temp:  [36.7 ??C-37 ??C] 37 ??C  Heart Rate:  [86-96] 92  Resp:  [12-20] 18  BP: (120-151)/(66-85) 141/74  SpO2:  [94 %-100 %] 100 %,   Intake/Output Summary (Last 24 hours) at 11/19/2020 2014  Last data filed at 11/19/2020 1100  Gross per 24 hour   Intake 10 ml   Output 350 ml   Net -340 ml       Gen: In NAD, answers questions appropriately  Eyes: sclera anicteric, EOMI  HENT: atraumatic, soft tissue mass intraorally   RRR: Warm and well perfused  Lungs: CTAB, no crackles or wheezes, no use of accessory muscles  Abdomen: Normoactive bowel sounds, soft, NTND, no rebound/guarding  Extremities: 2+ BLE edema bilaterally, no calf tenderness  Psych: Alert, oriented, appropriate mood and affect    Labs/Studies: Labs and Studies from the last 24hrs per EMR and Reviewed    Wonda Cerise, MD   [facility] Internal Medicine, PGY2  Pager: [phone]

## 2020-11-20 NOTE — Unmapped (Signed)
Care Management  Initial Transition Planning Assessment              General  Care Manager assessed the patient by : Medical record review,Discussion with Clinical Care team  Orientation Level: Oriented X4  Functional level prior to admission: Independent  Reason for referral: Discharge Planning    Contact/Decision Maker  Extended Emergency Contact Information  Primary Emergency Contact: Ivan Banks  Mobile Phone: [phone]  Relation: Domestic Partner  Secondary Emergency Contact: Ivan Banks, Ivan Banks  Mobile Phone: [phone]  Relation: Brother    Legal Next of Kin / Guardian / POA / Advance Directives     HCDM, First Alternate: Ivan Banks - Domestic Partner - [phone]    HCDM, Second Alternate: Ezrah, [place] - Brother [phone]    Advance Directive (Medical Treatment)  Does patient have an advance directive covering medical treatment?: Patient does not have advance directive covering medical treatment.  Reason patient does not have an advance directive covering medical treatment:: Patient needs follow-up to complete one.    Health Care Decision Maker [HCDM] (Medical & Mental Health Treatment)  Healthcare Decision Maker: HCDM documented in the HCDM/Contact Info section.  Information offered on HCDM, Medical & Mental Health advance directives:: Other (Comment) (Will do consult for Case Management)         Patient Information  Lives with: Spouse/significant other    Type of Residence: Private residence        Location/Detail: [address] [place], [place] [address]    Support Systems/Concerns: Significant Other,Family Members,Friends/Neighbors    Responsibilities/Dependents at home?: No    Home Care services in place prior to admission?: Yes  Type of Home Care services in place prior to admission: Home nursing visits,Other (Comment) [facility])  Current Home Care provider (Name/Phone #): Otho Ket with [facility] [phone]                    Currently receiving outpatient dialysis?: No       Financial Information       Need for financial assistance?: Other (Comment) (Financial Assistance approved  06/2019)       Social Determinants of Health  Social Determinants of Health were addressed in provider documentation.  Please refer to patient history.    Discharge Needs Assessment  Concerns to be Addressed: no discharge needs identified    Clinical Risk Factors: Principal Diagnosis: Cancer, Stroke, COPD, Heart Failure, AMI, Pneumonia, Joint Replacment         Prior overnight hospital stay or ED visit in last 90 days: Yes              Anticipated Changes Related to Illness: none    Equipment Needed After Discharge: none     Type of Residence: Mailing Address:  [address]  [place] [address]  Contacts: Password: Cat  Patient Phone Number: [phone] (Mobile)      Medical Provider(s): Suanne Marker, MD  Reason for Admission: Admitting Diagnosis:  Biliary Obstruction  Past Medical History:   has a past medical history of Anemia, Anxiety, Cancer (CMS-HCC), Chronic back pain, Diabetes mellitus (CMS-HCC) (08/08/2020), Hypertension, and Substance abuse (CMS-HCC).  Past Surgical History:   has a past surgical history that includes Knee arthroscopy (Left, 1990) and IR Insert Port Age Greater Than 5 Years (08/12/2020).   Previous admit date: 09/11/2020    Primary Insurance- Payor: [place] [employer] [place] / Plan: [place] [employer] OF [place] /  Product Type: *No Product type* /   Secondary Insurance ??? None  Prescription Coverage ??? YES  Preferred Pharmacy - [facility] [place], [place] - [address]  [facility] - [place],  - [address]    Transportation home: to be determined'            Discharge Facility/Level of Care Needs:      Readmission  Risk of Unplanned Readmission Score: UNPLANNED READMISSION SCORE: 29%  Predictive Model Details          29% (High)  Factor Value    Calculated 11/20/2020 12:04 17% Number of ED visits in last six months 4    [place] Risk of Unplanned Readmission Model 17% Number of active Rx orders 32     8% Diagnosis of cancer present     8% Charlson Comorbidity Index 9     7% ECG/EKG order present in last 6 months     6% Latest BUN high (29 mg/dL)     6% Diagnosis of electrolyte disorder present     5% Imaging order present in last 6 months     5% Latest hemoglobin low (8.7 g/dL)     5% Phosphorous result present     4% Number of hospitalizations in last year 1     4% Age 59     4% Diagnosis of deficiency anemia present     3% Active anticoagulant Rx order present     2% Future appointment scheduled     1% Current length of stay 1.772 days      Readmitted Within the Last 30 Days? (No if blank)   Patient at risk for readmission?: Yes    Discharge Plan  Screen findings are: Care Manager reviewed the plan of the patient's care with the Multidisciplinary Team. No discharge planning needs identified at this time. Care Manager will continue to manage plan and monitor patient's progress with the team.    Expected Discharge Date: 11/22/2020    Expected Transfer from Critical Care:      Quality data for continuing care services shared with patient and/or representative?: N/A  Patient and/or family were provided with choice of facilities / services that are available and appropriate to meet post hospital care needs?: N/A       Initial Assessment complete?: Yes

## 2020-11-20 NOTE — Unmapped (Signed)
Biliary/Advanced Endoscopy Consult Service  Initial Consultation    Reason for Consultation:   Pt is seen in consultation at the request of [name], MD (Oncology/Hematology (MDE)) for assistance in management of elevated bilirubin.    Assessment and Recommendations:   Ivan Banks is a 59 y.o. 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    Assessment:  In the setting of this patient's known metastatic disease, the recent elevation of his liver enzymes in cholestatic pattern with Tbili now 5.3 and recent imaging findings are certainly concerning for potential biliary obstruction. The most likely etiology of biliary obstruction would be progression of metastatic pancreatic implant as there seems to have been some growth between recent scans, but the differential for lab abnormalities includes progressive lymphadenopathy leading to obstruction and also malignant infiltration of liver parenchyma though that has not been seen on prior imaging. We had recommended an MRCP for further evaluation but patient was unable to tolerate this due to coughing and lying flat. Tbili further elevated today. Reviewed with oncoming biliary attending who recommends proceeding with ERCP for further evaluation and treatment. No plans for EUS as oncology does not need further tissue.     I had a thorough discussion with the patient today in person concerning the proposed procedure: ERCP. We discussed this procedure and the associated benefits, alternatives, and risks. Risks include but are not limited to pancreatitis, perforation, bleeding, infection, need for transfusion, need for surgery, and rarely death. These risks are acknowledged to be higher than routine endoscopic procedures. All questions were answered and the patient or authorized representative was willing to proceed with the planned procedure/s. Written consent will be obtained by MD in procedural unit.     Recommendations:  - Remain NPO for ERCP today, 11/20/20  - Hold heparin gtt as you are doing until further advised   - Daily LFTs    Thank you for involving [place] in the care of your patient. We will continue to follow along with you. This patient was discussed with Dr. Georges Mouse who is in agreement with the plan.     For questions, please contact the on-call fellow for the [facility] Endoscopy Consult Service at [phone].     Oswaldo Conroy, FNP-C  Division of Gastroenterology  Advanced Therapeutic Endoscopy/Biliary Team    Subjective:   Interval History:  Unable to get MRCP due to inability to lay flat and coughing. Abdomen hurting more this morning - epigastric.      Allergies:  Venom-honey bee, Aspirin, Gabapentin, and Morphine  Medications:   Prior to Admission medications    Medication Dose, Route, Frequency   EPINEPHrine (EPIPEN) 0.3 mg/0.3 mL injection 0.3 mg, Intramuscular, Once   fluoride, sodium, (CLINPRO 5000) 1.1 % Pste Dental, Daily (standard), Brush on teeth twice daily, do not rinse, spit out excess leave on teeth for at least one hr before eating or drinking   lisinopriL (PRINIVIL,ZESTRIL) 20 MG tablet 20 mg, Oral, Daily (standard)   metFORMIN (GLUCOPHAGE-XR) 500 MG 24 hr tablet 1,000 mg, Oral, Daily   polyethylene glycol (MIRALAX) 17 gram/dose powder 17 g, Oral, 2 times a day PRN   silver sulfaDIAZINE (SILVADENE, SSD) 1 % cream Apply to affected area daily   apixaban (ELIQUIS) 5 mg Tab 5 mg, Oral, 2 times a day (standard)   blood sugar diagnostic (GLUCOSE  BLOOD) Strp Use to check blood sugar as directed with insulin 3 times a day & for symptoms of high or low blood sugar.   blood-glucose meter kit Use as instructed   blood-glucose meter kit Use as instructed   cholecalciferol, vitamin D3-10 mcg, 400 unit,, 10 mcg (400 unit) cap 10 mcg, Daily before breakfast  Patient not taking: Reported on 11/18/2020   DULoxetine (CYMBALTA) 30 MG capsule 30 mg, Oral, Every morning  Patient not taking: Reported on 11/11/2020   insulin glargine (LANTUS SOLOSTAR U-100 INSULIN) 100 unit/mL (3 mL) injection pen 15 Units, Subcutaneous, Daily (standard)  Patient not taking: Reported on 11/18/2020   lancets (FREESTYLE) 28 gauge Misc Test daily before all meals/snacks and once before bedtime.   lancets (FREESTYLE) 28 gauge Misc Test daily before all meals/snacks and once before bedtime.   lancets Misc Use to check blood sugar as directed with insulin 3 times a day & for symptoms of high or low blood sugar.   naloxone (NARCAN) 4 mg nasal spray One spray in either nostril once for known/suspected opioid overdose. May repeat every 2-3 minutes in alternating nostril til EMS arrives   pen needle, diabetic 31 gauge x 1/4 (6 mm) Ndle Use for insulin administration daily   zinc gluconate 50 mg (7 mg elemental zinc) tablet 50 mg, Daily (standard)  Patient not taking: Reported on 11/18/2020     Medical History:  Past Medical History:   Diagnosis Date   ??? Anemia    ??? Anxiety    ??? Cancer (CMS-HCC)    ??? Chronic back pain    ??? Diabetes mellitus (CMS-HCC) 08/08/2020   ??? Hypertension    ??? Substance abuse (CMS-HCC)      Surgical History:  Past Surgical History:   Procedure Laterality Date   ??? IR INSERT PORT AGE GREATER THAN 5 YRS  08/12/2020    IR INSERT PORT AGE GREATER THAN 5 YRS 08/12/2020 Jobe Gibbon, MD IMG VIR [facility]   ??? KNEE ARTHROSCOPY Left 1990     Social History:  Social History     Tobacco Use   ??? Smoking status: Never Smoker   ??? Smokeless tobacco: Never Used   Vaping Use   ??? Vaping Use: Never used   Substance Use Topics   ??? Alcohol use: Not Currently     Alcohol/week: 84.0 standard drinks     Types: 84 Cans of beer per week   ??? Drug use: Yes     Frequency: 21.0 times per week     Types: Marijuana, Benzodiazepines     Comment: Smokes 2-3x a day     Family History:  Family History Problem Relation Age of Onset   ??? Cancer Mother    ??? Diabetes Father    ??? Kidney disease Father    ??? Diabetes Sister    ??? Lupus Sister    ??? No Known Problems Brother      Review of Systems:  10 systems were reviewed and are negative unless otherwise mentioned in the HPI    Objective:   Physical Exam:  Temp:  [36.4 ??C (97.5 ??F)-37 ??C (98.6 ??F)] 36.7 ??C (98.1 ??F)  Heart Rate:  [86-92] 87  Resp:  [17-18] 17  BP: (120-141)/(66-84) 128/74  SpO2:  [95 %-100 %] 97 %  Wt Readings from Last 3 Encounters:   11/20/20 70.2 kg (154 lb 12.2 oz)   11/18/20 72.6 kg (160 lb)   11/11/20 74.6 kg (164 lb 7.4 oz)  Gen: NAD, thin appearing black male   HEENT: Conjunctiva icteric  CV: RRR  Resp: Breathing comfortably  Abdomen: Soft/tender in RUQ and epigastric area/nd, no rebound/guarding  MSK/Ext: Warm, well perfused  Psych/Neuro: Alert & oriented.     Labs/Studies:  Labs, studies, and imaging from the last 24hrs per EMR and personally reviewed.    Prior GI Procedures / Endoscopy:  None in our system (colonoscopy in 2009, but he cannot recall results)    Abdominal imaging studies:  11/19/20 Liver Doppler  LIVER: The liver is normal in echogenicity. No focal hepatic lesions. No intrahepatic biliary ductal dilatation. Dilated common bile duct measuring up to 1.0 cm, seen on prior.  ??  GALLBLADDER: The gallbladder is distended with a mobile layering sludge. No definite cholelithiasis. Sonographic Eulah Pont sign is negative.   Trace pericholecystic fluid. Mild gallbladder wall thickening.  ??  PANCREAS: Dilated main pancreatic duct measuring up to 1.0 cm similar to prior CT dated 11/03/2020. The pancreatic neck metastatic implant is partially visualized measuring approximately 2.8 cm, better seen on prior CT.    IMPRESSION:  -Distended gallbladder with mild gallbladder wall thickening. No evidence of cholelithiasis. No sonographic Murphy sign.  -Dilated common bile duct and main pancreatic duct similar to prior CT dated 11/03/2020. The pancreatic neck mass is partially visualized and better seen on prior CT.   -Trace pericholecystic fluid or ascites.  -Multiple metastatic retroperitoneal implants, which are better characterized on prior CT.  -Bilateral pleural effusions and trace ascites, seen on prior CT.  -Hepatomegaly. Patent hepatic vasculature with normal flow direction.    11/03/20 CTAP  HEPATOBILIARY: No focal hepatic lesions. Hydropic without radiopaque gallstones, similar to prior. No biliary dilatation.    SPLEEN: Unremarkable.  PANCREAS: Dilatation of the main pancreatic duct up to 1.0 cm secondary to a 2.6 cm metastatic implant within the pancreatic neck (1:46), previously 2.1 cm, similar to prior.  Since 09/23/2020   -Multiple metastatic lesions throughout the abdomen and pelvis with associated lymphadenopathy overall increased compared to prior.  -Small-volume ascites, likely reactive versus malignant.  -Hydropic gallbladder without evidence of radiopaque gallstones, likely secondary to obstruction from metastatic soft tissue lesion in the pancreatic neck. However, if there is clinical concern for cholelithiasis, right upper quadrant ultrasound is recommended.  - Increased focal narrowing at the confluence of the superior mesenteric vein and portal vein and new focal occlusion of the confluence of the splenic vein and portal vein due to compression by adjacent metastatic lesions  -See separately dictated CT of the chest for findings above the diaphragm.    09/23/20 CTAP  PANCREAS: There is a similar appearance to prior study of main pancreatic ductal dilatation distal to metastatic implant in the pancreatic neck, which measures approximately 2.1 cm (5:52), previously 2.1 cm.    05/12/2020 CTAP    Pancreas: There is a heterogeneous ill-defined lesion in the  posterior pancreatic head measuring approximately 1.3 by 0.9 cm.  There is diffuse pancreatic ductal dilatation measuring up to 6 mm.  Also within the pancreatic tail there is an ill-defined hypodense  lesion measuring 2.4 by 1.3 cm.

## 2020-11-20 NOTE — Unmapped (Signed)
Pt unable to tolerate MRI last night d/t coughing and sob when laying flat. Pt reported upon returning he thought anxiety medication would help with the cough. IV dilaudid ordered for BTP, pt said this brought his pain down from a 10 to a 6. Pt sleeps in chair for comfort. NPO since MN and heparin gtt stopped at 0200 per orders for possible procedure today. VSS and afebrile. Will continue to monitor.

## 2020-11-20 NOTE — Unmapped (Signed)
Pt AxOx4, afebrile, VSS, and free of falls/injuries this shift.  Meds given per MAR.  Pt has been NPO since 1440 for MRI.  Heparin dose adjusted per order.  Heparin to be paused when pt is taken for MRI, per provider.  Urine culture collected and sent.  Pt had no other complaints this shift.  Continue to monitor.

## 2020-11-20 NOTE — Unmapped (Signed)
Adult Nutrition Assessment Note    Visit Type: RN Consult  Reason for Visit: Per Admission Nutrition Screen (Adult),Have you gained or lost 10 pounds in the past 3 months?,Have you had a decrease in food intake or appetite?      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??with elevated bilirubin concerning for malignant biliary obstruction    Anthropometric Data:  Height: 185.5 cm (6' 1.03)   Admission weight: 70.2 kg (154 lb 11.2 oz)  Last recorded weight: 70.2 kg (154 lb 12.2 oz)  IBW: 83.6 kg  Percent IBW: 83.97 %  BMI: Body mass index is 20.4 kg/m??.   Usual Body Weight: 185 lbs 1 year ago    Weight history prior to admission: Reports losing a significant amount of weight after stating Chemo therapy. Started at 185 lbs, lowest 142 lbs in September 2021 (21.6% weight loss). He has gained weight since September. Pt now weighs 154 lbs although he reports having some LE edema.    Wt Readings from Last 10 Encounters:   11/20/20 70.2 kg (154 lb 12.2 oz)   11/18/20 72.6 kg (160 lb)   11/11/20 74.6 kg (164 lb 7.4 oz)   11/11/20 74.6 kg (164 lb 7.4 oz)   11/04/20 76.6 kg (168 lb 14.4 oz)   11/04/20 76.1 kg (167 lb 11.2 oz)   10/14/20 72.3 kg (159 lb 6.3 oz)   10/14/20 72.6 kg (160 lb)   09/23/20 71.2 kg (157 lb)   09/23/20 71.6 kg (157 lb 12.8 oz)        Weight changes this admission:   Last 5 Recorded Weights    11/19/20 2238 11/20/20 0905   Weight: 70.2 kg (154 lb 11.2 oz) 70.2 kg (154 lb 12.2 oz)        Nutrition Focused Physical Exam:  Nutrition Focused Physical Exam:  Fat Areas Examined  Orbital: Severe loss      Muscle Areas Examined  Temple: Severe loss  Clavicle: Severe loss  Acromion: Severe loss  Scapular: Severe loss  Dorsal Hand: Severe loss              Nutrition Evaluation  Overall Impressions: Severe fat loss;Severe muscle loss (11/20/20 0906)  Nutrition Designation: Normal weight (BMI 18.50 - 24.99 kg/m2) (11/20/20 0906)      NUTRITIONALLY RELEVANT DATA     Medications:   Nutritionally pertinent medications reviewed and evaluated for potential food and/or medication interactions and include polyethylene glycol, lispro sliding scale insulin AC/HS and oxycodone     Labs:   Nutritionally pertinent labs reviewed and include Na: 128 mmol/L    Nutrition History:   November 20, 2020: Prior to admission: Patient with decreased appetite sporadically x1 year due to chemo therapy, appetite returned in September but patient reports lack of appetite x1 week pta. He was stil able to eat 5-6 (small frequent) meals per day. Reports eating things like cereal, Mac & Cheese, Burgers etc. He drinks 2-3 ensure plus per day.     Allergies, Intolerances, Sensitivities, and/or Cultural/Religious Dietary Restrictions: none identified at this time     Current Nutrition:  NPO       Nutrition Orders   (From admission, onward)             Start     Ordered    11/20/20 0001  NPO No Exceptions; Procedure/Test  Effective midnight  Question Answer Comment   NPO Except: No Exceptions    Reason: Procedure/Test        11/19/20 1205                   Nutritional Needs:   Healthy balance of carbohydrate, protein, and fat.       Malnutrition Assessment using AND/ASPEN Clinical Characteristics:    Severe Protein-Calorie Malnutrition in the context of chronic illness (11/20/20 1610)  Interpretation of Wt. Loss: > 20% x 1 year  Fat Loss: Severe  Muscle Loss: Severe  Malnutrition Score: 3      GOALS and EVALUATION     ??? Patient to meet 70% or greater of nutritional needs via combination of meals, snacks, and/or oral supplements within 7 days.  - New  ??? Patient to remain NPO and/or on a clear liquid diet less than 1-2 days before diet advancement. - New    Motivation, Barriers, and Compliance:  Evaluation of motivation, barriers, and compliance completed. No concerns identified at this time.     NUTRITION ASSESSMENT     ??? Current nutrition therapy is appropriate although not meeting nutritional needs at this time due to NPO status.   ??? Patient would benefit from start of oral supplement to better meet nutritional needs.       Discharge Planning:   Monitor for potential discharge needs with multi-disciplinary team.       NUTRITION INTERVENTIONS and RECOMMENDATION     1. Encourage po intake  Strategies to help maximize intake/sneak in calories:   -Encourage small frequent meals/snacks    -Offer/encourage liquids that contain calories instead of water    -Have supplements/shakes available at the bedside to sip on through out the day   -Give oral meds with supplements or shakes instead of water   2. Recommend ensure plus chocolate 4x/d  3. Please weigh weekly     Follow-Up Parameters:   1-2 times per week (and more frequent as indicated)    Jenny Reichmann, RD, LDN  [phone]

## 2020-11-21 DIAGNOSIS — Z7984 Long term (current) use of oral hypoglycemic drugs: Principal | ICD-10-CM

## 2020-11-21 DIAGNOSIS — Z7901 Long term (current) use of anticoagulants: Principal | ICD-10-CM

## 2020-11-21 DIAGNOSIS — R600 Localized edema: Principal | ICD-10-CM

## 2020-11-21 DIAGNOSIS — M549 Dorsalgia, unspecified: Principal | ICD-10-CM

## 2020-11-21 DIAGNOSIS — T451X5A Adverse effect of antineoplastic and immunosuppressive drugs, initial encounter: Principal | ICD-10-CM

## 2020-11-21 DIAGNOSIS — E871 Hypo-osmolality and hyponatremia: Principal | ICD-10-CM

## 2020-11-21 DIAGNOSIS — E43 Unspecified severe protein-calorie malnutrition: Principal | ICD-10-CM

## 2020-11-21 DIAGNOSIS — K7689 Other specified diseases of liver: Principal | ICD-10-CM

## 2020-11-21 DIAGNOSIS — K9189 Other postprocedural complications and disorders of digestive system: Principal | ICD-10-CM

## 2020-11-21 DIAGNOSIS — Z6820 Body mass index (BMI) 20.0-20.9, adult: Principal | ICD-10-CM

## 2020-11-21 DIAGNOSIS — C7989 Secondary malignant neoplasm of other specified sites: Principal | ICD-10-CM

## 2020-11-21 DIAGNOSIS — I82621 Acute embolism and thrombosis of deep veins of right upper extremity: Principal | ICD-10-CM

## 2020-11-21 DIAGNOSIS — E099 Drug or chemical induced diabetes mellitus without complications: Principal | ICD-10-CM

## 2020-11-21 DIAGNOSIS — K831 Obstruction of bile duct: Principal | ICD-10-CM

## 2020-11-21 DIAGNOSIS — C4912 Malignant neoplasm of connective and soft tissue of left upper limb, including shoulder: Principal | ICD-10-CM

## 2020-11-21 DIAGNOSIS — C4911 Malignant neoplasm of connective and soft tissue of right upper limb, including shoulder: Principal | ICD-10-CM

## 2020-11-21 DIAGNOSIS — G893 Neoplasm related pain (acute) (chronic): Principal | ICD-10-CM

## 2020-11-21 DIAGNOSIS — Z79899 Other long term (current) drug therapy: Principal | ICD-10-CM

## 2020-11-21 DIAGNOSIS — C7800 Secondary malignant neoplasm of unspecified lung: Principal | ICD-10-CM

## 2020-11-21 DIAGNOSIS — I110 Hypertensive heart disease with heart failure: Principal | ICD-10-CM

## 2020-11-21 DIAGNOSIS — I5022 Chronic systolic (congestive) heart failure: Principal | ICD-10-CM

## 2020-11-21 DIAGNOSIS — Z20822 Contact with and (suspected) exposure to covid-19: Principal | ICD-10-CM

## 2020-11-21 LAB — MAGNESIUM: MAGNESIUM: 1.7 mg/dL (ref 1.6–2.6)

## 2020-11-21 LAB — CBC W/ AUTO DIFF
BASOPHILS ABSOLUTE COUNT: 0 10*9/L (ref 0.0–0.1)
BASOPHILS RELATIVE PERCENT: 0.4 %
EOSINOPHILS ABSOLUTE COUNT: 0.1 10*9/L (ref 0.0–0.4)
EOSINOPHILS RELATIVE PERCENT: 1.2 %
HEMATOCRIT: 25.9 % — ABNORMAL LOW (ref 41.0–53.0)
HEMOGLOBIN: 8 g/dL — ABNORMAL LOW (ref 13.5–17.5)
LARGE UNSTAINED CELLS: 3 % (ref 0–4)
LYMPHOCYTES ABSOLUTE COUNT: 0.5 10*9/L — ABNORMAL LOW (ref 1.5–5.0)
LYMPHOCYTES RELATIVE PERCENT: 7.2 %
MEAN CORPUSCULAR HEMOGLOBIN CONC: 30.9 g/dL — ABNORMAL LOW (ref 31.0–37.0)
MEAN CORPUSCULAR HEMOGLOBIN: 33.1 pg (ref 26.0–34.0)
MEAN CORPUSCULAR VOLUME: 106.8 fL — ABNORMAL HIGH (ref 80.0–100.0)
MEAN PLATELET VOLUME: 9.9 fL (ref 7.0–10.0)
MONOCYTES ABSOLUTE COUNT: 0.5 10*9/L (ref 0.2–0.8)
MONOCYTES RELATIVE PERCENT: 6.7 %
NEUTROPHILS ABSOLUTE COUNT: 5.9 10*9/L (ref 2.0–7.5)
NEUTROPHILS RELATIVE PERCENT: 81.9 %
PLATELET COUNT: 530 10*9/L — ABNORMAL HIGH (ref 150–440)
RED BLOOD CELL COUNT: 2.42 10*12/L — ABNORMAL LOW (ref 4.50–5.90)
RED CELL DISTRIBUTION WIDTH: 15.7 % — ABNORMAL HIGH (ref 12.0–15.0)
WBC ADJUSTED: 7.2 10*9/L (ref 4.5–11.0)

## 2020-11-21 LAB — PHOSPHORUS: PHOSPHORUS: 4.9 mg/dL (ref 2.4–5.1)

## 2020-11-21 LAB — COMPREHENSIVE METABOLIC PANEL
ALBUMIN: 2.3 g/dL — ABNORMAL LOW (ref 3.4–5.0)
ALKALINE PHOSPHATASE: 484 U/L — ABNORMAL HIGH (ref 46–116)
ALT (SGPT): 33 U/L (ref 10–49)
ANION GAP: 7 mmol/L (ref 5–14)
AST (SGOT): 59 U/L — ABNORMAL HIGH (ref <=34)
BILIRUBIN TOTAL: 2.6 mg/dL — ABNORMAL HIGH (ref 0.3–1.2)
BLOOD UREA NITROGEN: 38 mg/dL — ABNORMAL HIGH (ref 9–23)
BUN / CREAT RATIO: 51
CALCIUM: 9.3 mg/dL (ref 8.7–10.4)
CHLORIDE: 101 mmol/L (ref 98–107)
CO2: 22 mmol/L (ref 20.0–31.0)
CREATININE: 0.74 mg/dL
EGFR CKD-EPI AA MALE: >90 mL/min/{1.73_m2} (ref >=60)
EGFR CKD-EPI NON-AA MALE: >90 mL/min/{1.73_m2} (ref >=60)
GLUCOSE RANDOM: 147 mg/dL (ref 70–179)
POTASSIUM: 4.8 mmol/L — ABNORMAL HIGH (ref 3.4–4.5)
PROTEIN TOTAL: 6.9 g/dL (ref 5.7–8.2)
SODIUM: 130 mmol/L — ABNORMAL LOW (ref 135–145)

## 2020-11-21 MED ADMIN — HYDROmorphone (PF) (DILAUDID) injection 1 mg: 1 mg | INTRAVENOUS | @ 01:00:00 | Stop: 2020-11-21

## 2020-11-21 MED ADMIN — lisinopriL (PRINIVIL,ZESTRIL) tablet 20 mg: 20 mg | ORAL | @ 02:00:00 | Stop: 2020-11-24

## 2020-11-21 MED ADMIN — oxyCODONE (ROXICODONE) immediate release tablet 10 mg: 10 mg | ORAL | Stop: 2020-11-23

## 2020-11-21 MED ADMIN — furosemide (LASIX) injection 20 mg: 20 mg | INTRAVENOUS | @ 15:00:00 | Stop: 2020-11-21

## 2020-11-21 MED ADMIN — oxyCODONE (OXYCONTIN) 12 hr crush resistant ER/CR tablet 20 mg: 20 mg | ORAL | @ 13:00:00 | Stop: 2020-11-24

## 2020-11-21 MED ADMIN — oxyCODONE (OXYCONTIN) 12 hr crush resistant ER/CR tablet 20 mg: 20 mg | ORAL | @ 02:00:00 | Stop: 2020-11-24

## 2020-11-21 MED ADMIN — insulin lispro (HumaLOG) injection 0-12 Units: 0-12 [IU] | SUBCUTANEOUS | @ 17:00:00 | Stop: 2020-11-24

## 2020-11-21 MED ADMIN — oxyCODONE (ROXICODONE) immediate release tablet 10 mg: 10 mg | ORAL | @ 13:00:00 | Stop: 2020-11-23

## 2020-11-21 MED ADMIN — sodium chloride (NS) 0.9 % flush 10 mL: 10 mL | INTRAVENOUS | @ 02:00:00 | Stop: 2020-11-24

## 2020-11-21 MED ADMIN — oxyCODONE (ROXICODONE) immediate release tablet 10 mg: 10 mg | ORAL | @ 06:00:00 | Stop: 2020-11-21

## 2020-11-21 MED ADMIN — HYDROmorphone (PF) (DILAUDID) injection 1 mg: 1 mg | INTRAVENOUS | @ 04:00:00 | Stop: 2020-11-21

## 2020-11-21 MED ADMIN — silver sulfaDIAZINE (SILVADENE, SSD) 1 % cream: TOPICAL | @ 15:00:00 | Stop: 2020-11-24

## 2020-11-21 MED ADMIN — oxyCODONE (ROXICODONE) immediate release tablet 10 mg: 10 mg | ORAL | @ 19:00:00 | Stop: 2020-11-23

## 2020-11-21 MED ADMIN — sodium chloride (NS) 0.9 % flush 10 mL: 10 mL | INTRAVENOUS | @ 15:00:00 | Stop: 2020-11-24

## 2020-11-21 NOTE — Unmapped (Signed)
Oncology (MEDO) Progress Note    Assessment & Plan:   Ivan Banks is a 59 y.o. 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    Principal Problem:    Elevated bilirubin  Active Problems:    Metastatic sarcoma (CMS-HCC)    Right arm pain    Acute deep vein thrombosis (DVT) of brachial vein of right upper extremity (CMS-HCC)    Hydrops of gallbladder    Bilateral lower extremity edema    Hyponatremia  Resolved Problems:    * No resolved hospital problems. *    Malignant biliary obstruction s/p biliary stent  Presented with 1-2 weeks of progressive malaise, decreased appetite, weakness, and abdominal fullness, noted to have up-trending bilirubin in outpatient setting to 5.1 (direct 4.20) in the setting of heavy metastatic disease burden in the abdomen and pelvis. No signs concerning for cholangitis. Abdominal U/S showed dilation of the common bile duct and main pancreatic duct which are additional findings concerning for obstruction. During ERCP, found to have biliary stricture in the lower third of the main bile duct. Biliary sphincterotomy was performed and metal stent was placed in the common bile duct.  -- Appreciate biliary team recommendations   -- Regular diet  -- Continue to hold heparin (f/u GI recs)  -- Daily CMP and billi  ??  Lower Extremity Edema:   Patient with impressive bilateral lower extremity edema that he states is worse in the last couple weeks. Differential includes hepatic/portal vein/IVC thrombus (on home [facility]), anasarca (hypoalbuminemic, although would expect more generalized edema), heart failure. Liver doppler on admission showed patent hepatic vasculature. Last echo 9/21 showed normal LVEF. Has some s/s of HF including elevated BNP, JVD, 2+ pitting edema, endorses orthopnea and PND. Plan to obtain echo to further evaluate and begin diuresis tomorrow.  -- F/u echo   -- Diurese tomorrow (waiting for him to settle after procedure today)    Hypervolemic Hyponatremia   Patient presenting with Na: 129. Serum Osm pending, but most likely hypotonic hyponatremia. He appears hypervolemic on exam with 2+ pitting edema and JVD a few centimeters above the clavicle. Again, HF is a possible explanation. No signs of cirrhosis on abdominal imaging.   -- Diurese tomorrow (waiting for him to settle after procedure today)  -- Trend Na    Metastatic Sarcoma: S/p palliative XRT, progression on olaparib, progression on doxorubicin second line, recently started on third line pembolizumab (C1D1 12/21). Follows with Dr. Dorna Mai. New intraoral lesion with CT imaging most consistent with metastatic disease as well as mets to the left upper neck and nasomaxillary junction.   ????  DVT of Right Mid Brachial Vein:   Diagnosed in September, 2021 on Eliquis, but also with extrinsic compression and luminal narrowing of right brachiocephalic vein.  Exam with continued tightness in RUE c/f mechanical obstruction.   -- Hold heparin   -- Restart home apixaban when able when safe from GI perspective  ??  Chemotherapy Induced Type I DM:   Last Hgb A1c 11.7, previously insulin-dependent, but with improving glycemic control and no longer on insulin.  -Hold home metformin  -Sliding scale insulin  ??  Chronic Pain: Follows with chronic pain.  -Continue oxycodone ER 20 mg BID in place of home Xtampza ER 18 mg twice daily.  -Oxycodone 10 mg every 8 hours as needed, oxycodone 10 mg??every 6 hours  as needed.  -Stop gabapentin-produce dizziness.  -Start Cymbalta 30 mg every morning.    Daily Checklist:  Diet: NPO at MN  DVT PPx: Heparin drip  Electrolytes: Replete Potassium to >/= 3.6 and Magnesium to >/= 1.8  Code Status: Full Code  Dispo: Admit to Med O    Team Contact Information:   Primary Team: Oncology (MEDO)  Primary Resident: Bernita Buffy, MD  Resident's Pager: [phone] (Hematology Senior Resident)    Interval History:   NOE. No complaints this morning. Planning for ERCP today. Was not able to get MRCP overnight due to shortness of breath.    Objective:     Temp:  [36.3 ??C-36.9 ??C] 36.4 ??C  Heart Rate:  [68-92] 92  SpO2 Pulse:  [69-88] 70  Resp:  [13-23] 22  BP: (111-142)/(59-84) 126/75  SpO2:  [93 %-100 %] 99 %,     Intake/Output Summary (Last 24 hours) at 11/20/2020 1824  Last data filed at 11/20/2020 1137  Gross per 24 hour   Intake 600 ml   Output 350 ml   Net 250 ml       Gen: In NAD, answers questions appropriately  Eyes: sclera anicteric, EOMI  HENT: atraumatic  CV: RRR, warm and well perfused  Lungs: CTAB, no crackles or wheezes, no use of accessory muscles  Abdomen: Soft, NTND, no rebound/guarding  Extremities: 2+ BLE edema bilaterally  Psych: Alert, oriented, appropriate mood and affect    Labs/Studies: Labs and Studies from the last 24hrs per EMR and Reviewed    Wonda Cerise, MD   [facility] Internal Medicine, PGY2  Pager: [phone]

## 2020-11-21 NOTE — Unmapped (Signed)
VS stable, no acute events. Patient required 2L [place] for about 2 hours after completing ERCP but is saturating well on room air by end of shift. Patient c/o pain in abdomen and arm throughout the day. PRNs given to manage pain. Wandra Feinstein, RN     Problem: Adult Inpatient Plan of Care  Goal: Plan of Care Review  Outcome: Progressing  Goal: Optimal Comfort and Wellbeing  Outcome: Progressing     Problem: Pain Acute  Goal: Acceptable Pain Control and Functional Ability  Outcome: Progressing

## 2020-11-21 NOTE — Unmapped (Signed)
VSS overnight. PRN pain medications given overnight for ongoing pain mgmt. Labs drawn.   Problem: Adult Inpatient Plan of Care  Goal: Plan of Care Review  Outcome: Ongoing - Unchanged  Goal: Patient-Specific Goal (Individualized)  Outcome: Ongoing - Unchanged  Goal: Absence of Hospital-Acquired Illness or Injury  Outcome: Ongoing - Unchanged  Intervention: Identify and Manage Fall Risk  Recent Flowsheet Documentation  Taken 11/20/2020 2039 by Lynda Rainwater, RN  Safety Interventions:   fall reduction program maintained   low bed   nonskid shoes/slippers when out of bed  Intervention: Prevent and Manage VTE (Venous Thromboembolism) Risk  Recent Flowsheet Documentation  Taken 11/20/2020 2039 by Lynda Rainwater, RN  Activity Management: activity adjusted per tolerance  Goal: Optimal Comfort and Wellbeing  Outcome: Ongoing - Unchanged  Goal: Readiness for Transition of Care  Outcome: Ongoing - Unchanged  Goal: Rounds/Family Conference  Outcome: Ongoing - Unchanged     Problem: Fall Injury Risk  Goal: Absence of Fall and Fall-Related Injury  Outcome: Ongoing - Unchanged  Intervention: Promote Injury-Free Environment  Recent Flowsheet Documentation  Taken 11/20/2020 2039 by Lynda Rainwater, RN  Safety Interventions:   fall reduction program maintained   low bed   nonskid shoes/slippers when out of bed     Problem: Self-Care Deficit  Goal: Improved Ability to Complete Activities of Daily Living  Outcome: Ongoing - Unchanged     Problem: Impaired Wound Healing  Goal: Optimal Wound Healing  Outcome: Ongoing - Unchanged  Intervention: Promote Wound Healing  Recent Flowsheet Documentation  Taken 11/20/2020 2039 by Lynda Rainwater, RN  Activity Management: activity adjusted per tolerance     Problem: Pain Acute  Goal: Acceptable Pain Control and Functional Ability  Outcome: Ongoing - Unchanged     Problem: Diabetes Comorbidity  Goal: Blood Glucose Level Within Targeted Range  Outcome: Ongoing - Unchanged     Problem: Pain Chronic (Persistent) (Comorbidity Management)  Goal: Acceptable Pain Control and Functional Ability  Outcome: Ongoing - Unchanged

## 2020-11-22 LAB — COMPREHENSIVE METABOLIC PANEL
ALBUMIN: 2.3 g/dL — ABNORMAL LOW (ref 3.4–5.0)
ALKALINE PHOSPHATASE: 891 U/L — ABNORMAL HIGH (ref 46–116)
ALT (SGPT): 132 U/L — ABNORMAL HIGH (ref 10–49)
ANION GAP: 8 mmol/L (ref 5–14)
AST (SGOT): 447 U/L — ABNORMAL HIGH (ref <=34)
BILIRUBIN TOTAL: 2.8 mg/dL — ABNORMAL HIGH (ref 0.3–1.2)
BLOOD UREA NITROGEN: 33 mg/dL — ABNORMAL HIGH (ref 9–23)
BUN / CREAT RATIO: 42
CALCIUM: 9.2 mg/dL (ref 8.7–10.4)
CHLORIDE: 101 mmol/L (ref 98–107)
CO2: 20 mmol/L (ref 20.0–31.0)
CREATININE: 0.79 mg/dL
EGFR CKD-EPI AA MALE: >90 mL/min/{1.73_m2} (ref >=60)
EGFR CKD-EPI NON-AA MALE: >90 mL/min/{1.73_m2} (ref >=60)
GLUCOSE RANDOM: 166 mg/dL (ref 70–179)
POTASSIUM: 4.4 mmol/L (ref 3.4–4.5)
PROTEIN TOTAL: 6.9 g/dL (ref 5.7–8.2)
SODIUM: 129 mmol/L — ABNORMAL LOW (ref 135–145)

## 2020-11-22 LAB — CK: CREATINE KINASE TOTAL: 73 U/L

## 2020-11-22 LAB — CBC W/ AUTO DIFF
BASOPHILS ABSOLUTE COUNT: 0 10*9/L (ref 0.0–0.1)
BASOPHILS RELATIVE PERCENT: 0.3 %
EOSINOPHILS ABSOLUTE COUNT: 0.1 10*9/L (ref 0.0–0.4)
EOSINOPHILS RELATIVE PERCENT: 1.1 %
HEMATOCRIT: 25.9 % — ABNORMAL LOW (ref 41.0–53.0)
HEMOGLOBIN: 8.1 g/dL — ABNORMAL LOW (ref 13.5–17.5)
LARGE UNSTAINED CELLS: 4 % (ref 0–4)
LYMPHOCYTES ABSOLUTE COUNT: 0.5 10*9/L — ABNORMAL LOW (ref 1.5–5.0)
LYMPHOCYTES RELATIVE PERCENT: 8.3 %
MEAN CORPUSCULAR HEMOGLOBIN CONC: 31.4 g/dL (ref 31.0–37.0)
MEAN CORPUSCULAR HEMOGLOBIN: 33.7 pg (ref 26.0–34.0)
MEAN CORPUSCULAR VOLUME: 107.1 fL — ABNORMAL HIGH (ref 80.0–100.0)
MEAN PLATELET VOLUME: 9.8 fL (ref 7.0–10.0)
MONOCYTES ABSOLUTE COUNT: 0.4 10*9/L (ref 0.2–0.8)
MONOCYTES RELATIVE PERCENT: 6.7 %
NEUTROPHILS ABSOLUTE COUNT: 4.8 10*9/L (ref 2.0–7.5)
NEUTROPHILS RELATIVE PERCENT: 80.1 %
PLATELET COUNT: 449 10*9/L — ABNORMAL HIGH (ref 150–440)
RED BLOOD CELL COUNT: 2.42 10*12/L — ABNORMAL LOW (ref 4.50–5.90)
RED CELL DISTRIBUTION WIDTH: 15.8 % — ABNORMAL HIGH (ref 12.0–15.0)
WBC ADJUSTED: 5.9 10*9/L (ref 4.5–11.0)

## 2020-11-22 LAB — GAMMA GT: GAMMA GLUTAMYL TRANSFERASE: 1980 U/L — ABNORMAL HIGH

## 2020-11-22 LAB — PHOSPHORUS: PHOSPHORUS: 3.8 mg/dL (ref 2.4–5.1)

## 2020-11-22 LAB — HEPATIC FUNCTION PANEL
ALBUMIN: 2.4 g/dL — ABNORMAL LOW (ref 3.4–5.0)
ALKALINE PHOSPHATASE: 960 U/L — ABNORMAL HIGH (ref 46–116)
ALT (SGPT): 155 U/L — ABNORMAL HIGH (ref 10–49)
AST (SGOT): 482 U/L — ABNORMAL HIGH (ref <=34)
BILIRUBIN DIRECT: 2.2 mg/dL — ABNORMAL HIGH (ref 0.00–0.30)
BILIRUBIN TOTAL: 2.7 mg/dL — ABNORMAL HIGH (ref 0.3–1.2)
PROTEIN TOTAL: 7.1 g/dL (ref 5.7–8.2)

## 2020-11-22 LAB — LIPASE: LIPASE: 72 U/L — ABNORMAL HIGH (ref 12–53)

## 2020-11-22 LAB — MAGNESIUM: MAGNESIUM: 1.6 mg/dL (ref 1.6–2.6)

## 2020-11-22 MED ADMIN — sodium chloride (NS) 0.9 % flush 10 mL: 10 mL | INTRAVENOUS | @ 13:00:00 | Stop: 2020-11-24

## 2020-11-22 MED ADMIN — guaiFENesin (ROBITUSSIN) oral syrup: 200 mg | ORAL | @ 14:00:00 | Stop: 2020-11-24

## 2020-11-22 MED ADMIN — oxyCODONE (ROXICODONE) immediate release tablet 10 mg: 10 mg | ORAL | @ 12:00:00 | Stop: 2020-11-23

## 2020-11-22 MED ADMIN — lidocaine (LIDODERM) 5 % patch 1 patch: 1 | TRANSDERMAL | @ 18:00:00 | Stop: 2020-11-24

## 2020-11-22 MED ADMIN — insulin lispro (HumaLOG) injection 0-12 Units: 0-12 [IU] | SUBCUTANEOUS | @ 18:00:00 | Stop: 2020-11-24

## 2020-11-22 MED ADMIN — HYDROmorphone (PF) (DILAUDID) injection 1 mg: 1 mg | INTRAVENOUS | @ 05:00:00 | Stop: 2020-11-22

## 2020-11-22 MED ADMIN — insulin lispro (HumaLOG) injection 0-12 Units: 0-12 [IU] | SUBCUTANEOUS | Stop: 2020-11-24

## 2020-11-22 MED ADMIN — silver sulfaDIAZINE (SILVADENE, SSD) 1 % cream: TOPICAL | @ 13:00:00 | Stop: 2020-11-24

## 2020-11-22 MED ADMIN — oxyCODONE (OXYCONTIN) 12 hr crush resistant ER/CR tablet 20 mg: 20 mg | ORAL | @ 01:00:00 | Stop: 2020-11-24

## 2020-11-22 MED ADMIN — oxyCODONE (OXYCONTIN) 12 hr crush resistant ER/CR tablet 20 mg: 20 mg | ORAL | @ 13:00:00 | Stop: 2020-11-24

## 2020-11-22 MED ADMIN — furosemide (LASIX) injection 20 mg: 20 mg | INTRAVENOUS | @ 18:00:00 | Stop: 2020-11-22

## 2020-11-22 MED ADMIN — apixaban (ELIQUIS) tablet 5 mg: 5 mg | ORAL | @ 13:00:00 | Stop: 2020-11-24

## 2020-11-22 MED ADMIN — sodium chloride (NS) 0.9 % flush 10 mL: 10 mL | INTRAVENOUS | @ 01:00:00 | Stop: 2020-11-24

## 2020-11-22 MED ADMIN — lisinopriL (PRINIVIL,ZESTRIL) tablet 20 mg: 20 mg | ORAL | @ 01:00:00 | Stop: 2020-11-24

## 2020-11-22 MED ADMIN — oxyCODONE (ROXICODONE) immediate release tablet 10 mg: 10 mg | ORAL | @ 17:00:00 | Stop: 2020-11-23

## 2020-11-22 MED ADMIN — oxyCODONE (ROXICODONE) immediate release tablet 10 mg: 10 mg | ORAL | Stop: 2020-11-23

## 2020-11-22 NOTE — Unmapped (Signed)
A+O, VSS. Continues with c/o pain to R arm, ribs, and abdomen rated 9 out of 10. Per patient not well controlled with oral pain regimen and requested IV pain meds. 1x IV dilaudid given on shift. Call bell within reach. Bed in lowest and locked position. WCTM.      Problem: Adult Inpatient Plan of Care  Goal: Plan of Care Review  Outcome: Ongoing - Unchanged  Goal: Patient-Specific Goal (Individualized)  Outcome: Ongoing - Unchanged  Goal: Optimal Comfort and Wellbeing  Outcome: Ongoing - Unchanged     Problem: Fall Injury Risk  Goal: Absence of Fall and Fall-Related Injury  Outcome: Ongoing - Unchanged  Intervention: Promote Injury-Free Environment  Recent Flowsheet Documentation  Taken 11/22/2020 0024 by Legrand Rams, RN  Safety Interventions:   bed alarm   fall reduction program maintained   lighting adjusted for tasks/safety   low bed   nonskid shoes/slippers when out of bed

## 2020-11-23 LAB — CBC W/ AUTO DIFF
BASOPHILS ABSOLUTE COUNT: 0 10*9/L (ref 0.0–0.1)
BASOPHILS RELATIVE PERCENT: 0.4 %
EOSINOPHILS ABSOLUTE COUNT: 0.4 10*9/L (ref 0.0–0.4)
EOSINOPHILS RELATIVE PERCENT: 6.8 %
HEMATOCRIT: 25.9 % — ABNORMAL LOW (ref 41.0–53.0)
HEMOGLOBIN: 7.7 g/dL — ABNORMAL LOW (ref 13.5–17.5)
LARGE UNSTAINED CELLS: 2 % (ref 0–4)
LYMPHOCYTES ABSOLUTE COUNT: 0.5 10*9/L — ABNORMAL LOW (ref 1.5–5.0)
LYMPHOCYTES RELATIVE PERCENT: 8.3 %
MEAN CORPUSCULAR HEMOGLOBIN CONC: 29.9 g/dL — ABNORMAL LOW (ref 31.0–37.0)
MEAN CORPUSCULAR HEMOGLOBIN: 32.7 pg (ref 26.0–34.0)
MEAN CORPUSCULAR VOLUME: 109.3 fL — ABNORMAL HIGH (ref 80.0–100.0)
MEAN PLATELET VOLUME: 10 fL (ref 7.0–10.0)
MONOCYTES ABSOLUTE COUNT: 0.3 10*9/L (ref 0.2–0.8)
MONOCYTES RELATIVE PERCENT: 4.6 %
NEUTROPHILS ABSOLUTE COUNT: 4.4 10*9/L (ref 2.0–7.5)
NEUTROPHILS RELATIVE PERCENT: 77.8 %
PLATELET COUNT: 386 10*9/L (ref 150–440)
RED BLOOD CELL COUNT: 2.37 10*12/L — ABNORMAL LOW (ref 4.50–5.90)
RED CELL DISTRIBUTION WIDTH: 16.2 % — ABNORMAL HIGH (ref 12.0–15.0)
WBC ADJUSTED: 5.6 10*9/L (ref 4.5–11.0)

## 2020-11-23 LAB — COMPREHENSIVE METABOLIC PANEL
ALBUMIN: 2.4 g/dL — ABNORMAL LOW (ref 3.4–5.0)
ALKALINE PHOSPHATASE: 915 U/L — ABNORMAL HIGH (ref 46–116)
ALT (SGPT): 156 U/L — ABNORMAL HIGH (ref 10–49)
ANION GAP: 6 mmol/L (ref 5–14)
AST (SGOT): 371 U/L — ABNORMAL HIGH (ref <=34)
BILIRUBIN TOTAL: 2.1 mg/dL — ABNORMAL HIGH (ref 0.3–1.2)
BLOOD UREA NITROGEN: 28 mg/dL — ABNORMAL HIGH (ref 9–23)
BUN / CREAT RATIO: 42
CALCIUM: 8.7 mg/dL (ref 8.7–10.4)
CHLORIDE: 102 mmol/L (ref 98–107)
CO2: 22 mmol/L (ref 20.0–31.0)
CREATININE: 0.66 mg/dL
EGFR CKD-EPI AA MALE: >90 mL/min/{1.73_m2} (ref >=60)
EGFR CKD-EPI NON-AA MALE: >90 mL/min/{1.73_m2} (ref >=60)
GLUCOSE RANDOM: 183 mg/dL — ABNORMAL HIGH (ref 70–179)
POTASSIUM: 4.4 mmol/L (ref 3.4–4.5)
PROTEIN TOTAL: 7 g/dL (ref 5.7–8.2)
SODIUM: 130 mmol/L — ABNORMAL LOW (ref 135–145)

## 2020-11-23 LAB — PHOSPHORUS: PHOSPHORUS: 2.7 mg/dL (ref 2.4–5.1)

## 2020-11-23 LAB — HEPATITIS B CORE ANTIBODY, TOTAL: HEPATITIS B CORE TOTAL ANTIBODY: NONREACTIVE

## 2020-11-23 LAB — HEPATITIS B SURFACE ANTIGEN: HEPATITIS B SURFACE ANTIGEN: NONREACTIVE

## 2020-11-23 LAB — HEPATITIS B SURFACE ANTIBODY
HEPATITIS B SURFACE ANTIBODY QUANT: <8 m[IU]/mL (ref <8.00)
HEPATITIS B SURFACE ANTIBODY: NONREACTIVE

## 2020-11-23 LAB — MAGNESIUM: MAGNESIUM: 1.6 mg/dL (ref 1.6–2.6)

## 2020-11-23 MED ADMIN — apixaban (ELIQUIS) tablet 5 mg: 5 mg | ORAL | @ 01:00:00 | Stop: 2020-11-24

## 2020-11-23 MED ADMIN — furosemide (LASIX) tablet 40 mg: 40 mg | ORAL | @ 15:00:00 | Stop: 2020-11-23

## 2020-11-23 MED ADMIN — oxyCODONE (ROXICODONE) immediate release tablet 10 mg: 10 mg | ORAL | @ 20:00:00 | Stop: 2020-11-24

## 2020-11-23 MED ADMIN — guaiFENesin (ROBITUSSIN) oral syrup: 200 mg | ORAL | @ 15:00:00 | Stop: 2020-11-24

## 2020-11-23 MED ADMIN — oxyCODONE (OXYCONTIN) 12 hr crush resistant ER/CR tablet 20 mg: 20 mg | ORAL | @ 13:00:00 | Stop: 2020-11-24

## 2020-11-23 MED ADMIN — insulin lispro (HumaLOG) injection 0-12 Units: 0-12 [IU] | SUBCUTANEOUS | @ 12:00:00 | Stop: 2020-11-24

## 2020-11-23 MED ADMIN — guaiFENesin (ROBITUSSIN) oral syrup: 200 mg | ORAL | @ 07:00:00 | Stop: 2020-11-24

## 2020-11-23 MED ADMIN — sodium chloride (NS) 0.9 % flush 10 mL: 10 mL | INTRAVENOUS | @ 13:00:00 | Stop: 2020-11-24

## 2020-11-23 MED ADMIN — silver sulfaDIAZINE (SILVADENE, SSD) 1 % cream: TOPICAL | @ 13:00:00 | Stop: 2020-11-24

## 2020-11-23 MED ADMIN — lidocaine (LIDODERM) 5 % patch 1 patch: 1 | TRANSDERMAL | @ 13:00:00 | Stop: 2020-11-24

## 2020-11-23 MED ADMIN — oxyCODONE (OXYCONTIN) 12 hr crush resistant ER/CR tablet 20 mg: 20 mg | ORAL | @ 01:00:00 | Stop: 2020-11-24

## 2020-11-23 MED ADMIN — lisinopriL (PRINIVIL,ZESTRIL) tablet 20 mg: 20 mg | ORAL | @ 01:00:00 | Stop: 2020-11-24

## 2020-11-23 MED ADMIN — oxyCODONE (ROXICODONE) immediate release tablet 10 mg: 10 mg | ORAL | @ 10:00:00 | Stop: 2020-11-23

## 2020-11-23 MED ADMIN — apixaban (ELIQUIS) tablet 5 mg: 5 mg | ORAL | @ 13:00:00 | Stop: 2020-11-24

## 2020-11-23 MED ADMIN — oxyCODONE (ROXICODONE) immediate release tablet 10 mg: 10 mg | ORAL | @ 06:00:00 | Stop: 2020-11-23

## 2020-11-23 MED ADMIN — sodium chloride (NS) 0.9 % flush 10 mL: 10 mL | INTRAVENOUS | @ 01:00:00 | Stop: 2020-11-24

## 2020-11-23 NOTE — Unmapped (Addendum)
Pt A&Ox4, VSS, pain somewhat controlled with current regimen, port patent with brisk blood return, IV patent, both with clean and intact dressings. Meds given, labs drawn, assessments done, no falls, family at bedside. Will report to PM RN.     Addendum: Dinnertime bg came back at 30 while I was talking with him - clearly inaccurate. Recheck was 170.     Problem: Adult Inpatient Plan of Care  Goal: Plan of Care Review  Outcome: Progressing  Goal: Patient-Specific Goal (Individualized)  Outcome: Progressing  Goal: Absence of Hospital-Acquired Illness or Injury  Outcome: Progressing  Intervention: Identify and Manage Fall Risk  Recent Flowsheet Documentation  Taken 11/22/2020 0900 by Carmelia Bake, RN  Safety Interventions:  ??? environmental modification  ??? fall reduction program maintained  ??? lighting adjusted for tasks/safety  ??? low bed  ??? nonskid shoes/slippers when out of bed  ??? no IV/BP/blood draw right arm  Goal: Optimal Comfort and Wellbeing  Outcome: Progressing  Goal: Readiness for Transition of Care  Outcome: Progressing  Goal: Rounds/Family Conference  Outcome: Progressing     Problem: Fall Injury Risk  Goal: Absence of Fall and Fall-Related Injury  Outcome: Progressing  Intervention: Promote Injury-Free Environment  Recent Flowsheet Documentation  Taken 11/22/2020 0900 by Carmelia Bake, RN  Safety Interventions:  ??? environmental modification  ??? fall reduction program maintained  ??? lighting adjusted for tasks/safety  ??? low bed  ??? nonskid shoes/slippers when out of bed  ??? no IV/BP/blood draw right arm     Problem: Self-Care Deficit  Goal: Improved Ability to Complete Activities of Daily Living  Outcome: Progressing     Problem: Impaired Wound Healing  Goal: Optimal Wound Healing  Outcome: Progressing     Problem: Pain Acute  Goal: Acceptable Pain Control and Functional Ability  Outcome: Progressing     Problem: Diabetes Comorbidity  Goal: Blood Glucose Level Within Targeted Range  Outcome: Progressing     Problem: Pain Chronic (Persistent) (Comorbidity Management)  Goal: Acceptable Pain Control and Functional Ability  Outcome: Progressing

## 2020-11-23 NOTE — Unmapped (Addendum)
A+O, VSS. Continues with c/o pain to R chest (MD notified), R ribs and BLE, PRN oxy given per [facility]. Pt up with assistance and walker, bed alarm activated. Call bell within reach. WCTM.    Problem: Adult Inpatient Plan of Care  Goal: Plan of Care Review  Outcome: Ongoing - Unchanged  Goal: Absence of Hospital-Acquired Illness or Injury  Outcome: Ongoing - Unchanged  Intervention: Identify and Manage Fall Risk  Recent Flowsheet Documentation  Taken 11/23/2020 0640 by Legrand Rams, RN  Safety Interventions:  ??? bed alarm  ??? fall reduction program maintained  ??? lighting adjusted for tasks/safety  ??? low bed  ??? nonskid shoes/slippers when out of bed  Intervention: Prevent and Manage VTE (Venous Thromboembolism) Risk  Recent Flowsheet Documentation  Taken 11/22/2020 2331 by Legrand Rams, RN  Activity Management: activity adjusted per tolerance  Goal: Optimal Comfort and Wellbeing  Outcome: Ongoing - Unchanged     Problem: Fall Injury Risk  Goal: Absence of Fall and Fall-Related Injury  Outcome: Ongoing - Unchanged  Intervention: Promote Injury-Free Environment  Recent Flowsheet Documentation  Taken 11/23/2020 0640 by Legrand Rams, RN  Safety Interventions:  ??? bed alarm  ??? fall reduction program maintained  ??? lighting adjusted for tasks/safety  ??? low bed  ??? nonskid shoes/slippers when out of bed

## 2020-11-24 LAB — COMPREHENSIVE METABOLIC PANEL
ALBUMIN: 2.5 g/dL — ABNORMAL LOW (ref 3.4–5.0)
ALKALINE PHOSPHATASE: 1050 U/L — ABNORMAL HIGH (ref 46–116)
ALT (SGPT): 181 U/L — ABNORMAL HIGH (ref 10–49)
ANION GAP: 5 mmol/L (ref 5–14)
AST (SGOT): 479 U/L — ABNORMAL HIGH (ref <=34)
BILIRUBIN TOTAL: 2.1 mg/dL — ABNORMAL HIGH (ref 0.3–1.2)
BLOOD UREA NITROGEN: 27 mg/dL — ABNORMAL HIGH (ref 9–23)
BUN / CREAT RATIO: 36
CALCIUM: 8.8 mg/dL (ref 8.7–10.4)
CHLORIDE: 102 mmol/L (ref 98–107)
CO2: 22 mmol/L (ref 20.0–31.0)
CREATININE: 0.74 mg/dL
EGFR CKD-EPI AA MALE: >90 mL/min/{1.73_m2} (ref >=60)
EGFR CKD-EPI NON-AA MALE: >90 mL/min/{1.73_m2} (ref >=60)
GLUCOSE RANDOM: 188 mg/dL — ABNORMAL HIGH (ref 70–179)
POTASSIUM: 4.4 mmol/L (ref 3.4–4.5)
PROTEIN TOTAL: 7.4 g/dL (ref 5.7–8.2)
SODIUM: 129 mmol/L — ABNORMAL LOW (ref 135–145)

## 2020-11-24 LAB — CMV DNA, QUANTITATIVE, PCR: CMV VIRAL LD: NOT DETECTED

## 2020-11-24 LAB — CBC W/ AUTO DIFF
BASOPHILS ABSOLUTE COUNT: 0.1 10*9/L (ref 0.0–0.1)
BASOPHILS RELATIVE PERCENT: 0.7 %
EOSINOPHILS ABSOLUTE COUNT: 0.4 10*9/L (ref 0.0–0.4)
EOSINOPHILS RELATIVE PERCENT: 5.3 %
HEMATOCRIT: 28.1 % — ABNORMAL LOW (ref 41.0–53.0)
HEMOGLOBIN: 8.7 g/dL — ABNORMAL LOW (ref 13.5–17.5)
LARGE UNSTAINED CELLS: 2 % (ref 0–4)
LYMPHOCYTES ABSOLUTE COUNT: 0.7 10*9/L — ABNORMAL LOW (ref 1.5–5.0)
LYMPHOCYTES RELATIVE PERCENT: 9.8 %
MEAN CORPUSCULAR HEMOGLOBIN CONC: 30.8 g/dL — ABNORMAL LOW (ref 31.0–37.0)
MEAN CORPUSCULAR HEMOGLOBIN: 32.9 pg (ref 26.0–34.0)
MEAN CORPUSCULAR VOLUME: 106.9 fL — ABNORMAL HIGH (ref 80.0–100.0)
MEAN PLATELET VOLUME: 9.9 fL (ref 7.0–10.0)
MONOCYTES ABSOLUTE COUNT: 0.4 10*9/L (ref 0.2–0.8)
MONOCYTES RELATIVE PERCENT: 5.9 %
NEUTROPHILS ABSOLUTE COUNT: 5.4 10*9/L (ref 2.0–7.5)
NEUTROPHILS RELATIVE PERCENT: 76 %
PLATELET COUNT: 480 10*9/L — ABNORMAL HIGH (ref 150–440)
RED BLOOD CELL COUNT: 2.63 10*12/L — ABNORMAL LOW (ref 4.50–5.90)
RED CELL DISTRIBUTION WIDTH: 15.9 % — ABNORMAL HIGH (ref 12.0–15.0)
WBC ADJUSTED: 7 10*9/L (ref 4.5–11.0)

## 2020-11-24 LAB — HEPATITIS C ANTIBODY: HEPATITIS C ANTIBODY: NONREACTIVE

## 2020-11-24 LAB — MAGNESIUM: MAGNESIUM: 1.6 mg/dL (ref 1.6–2.6)

## 2020-11-24 LAB — PHOSPHORUS: PHOSPHORUS: 3.1 mg/dL (ref 2.4–5.1)

## 2020-11-24 MED ORDER — MISCELLANEOUS MEDICAL SUPPLY MISC
0 refills | 0.00000 days | Status: CP
Start: 2020-11-24 — End: 2020-12-02

## 2020-11-24 MED ORDER — FUROSEMIDE 40 MG TABLET
ORAL_TABLET | 0 refills | 0 days | Status: CP
Start: 2020-11-24 — End: ?

## 2020-11-24 MED ORDER — OXYCODONE 10 MG TABLET
ORAL_TABLET | Freq: Four times a day (QID) | ORAL | 0 refills | 30.00000 days | Status: CP | PRN
Start: 2020-11-24 — End: 2020-12-04

## 2020-11-24 MED ORDER — XTAMPZA ER 18 MG CAPSULE SPRINKLE
Freq: Two times a day (BID) | ORAL | 0 refills | 30.00000 days | Status: CP
Start: 2020-11-24 — End: 2020-12-04

## 2020-11-24 MED ADMIN — oxyCODONE (ROXICODONE) immediate release tablet 10 mg: 10 mg | ORAL | @ 10:00:00 | Stop: 2020-11-24

## 2020-11-24 MED ADMIN — apixaban (ELIQUIS) tablet 5 mg: 5 mg | ORAL | @ 02:00:00 | Stop: 2020-11-24

## 2020-11-24 MED ADMIN — polyethylene glycol (MIRALAX) packet 17 g: 17 g | ORAL | @ 15:00:00 | Stop: 2020-11-24

## 2020-11-24 MED ADMIN — apixaban (ELIQUIS) tablet 5 mg: 5 mg | ORAL | @ 15:00:00 | Stop: 2020-11-24

## 2020-11-24 MED ADMIN — guaiFENesin (ROBITUSSIN) oral syrup: 200 mg | ORAL | @ 15:00:00 | Stop: 2020-11-24

## 2020-11-24 MED ADMIN — lisinopriL (PRINIVIL,ZESTRIL) tablet 20 mg: 20 mg | ORAL | @ 02:00:00 | Stop: 2020-11-24

## 2020-11-24 MED ADMIN — insulin lispro (HumaLOG) injection 0-12 Units: 0-12 [IU] | SUBCUTANEOUS | @ 19:00:00 | Stop: 2020-11-24

## 2020-11-24 MED ADMIN — insulin lispro (HumaLOG) injection 0-12 Units: 0-12 [IU] | SUBCUTANEOUS | @ 04:00:00 | Stop: 2020-11-24

## 2020-11-24 MED ADMIN — lidocaine (LIDODERM) 5 % patch 1 patch: 1 | TRANSDERMAL | @ 15:00:00 | Stop: 2020-11-24

## 2020-11-24 MED ADMIN — sodium chloride (NS) 0.9 % flush 10 mL: 10 mL | INTRAVENOUS | @ 02:00:00 | Stop: 2020-11-24

## 2020-11-24 MED ADMIN — sodium chloride (NS) 0.9 % flush 10 mL: 10 mL | INTRAVENOUS | @ 15:00:00 | Stop: 2020-11-24

## 2020-11-24 MED ADMIN — oxyCODONE (ROXICODONE) immediate release tablet 10 mg: 10 mg | ORAL | @ 02:00:00 | Stop: 2020-11-24

## 2020-11-24 MED ADMIN — oxyCODONE (OXYCONTIN) 12 hr crush resistant ER/CR tablet 20 mg: 20 mg | ORAL | @ 15:00:00 | Stop: 2020-11-24

## 2020-11-24 MED ADMIN — oxyCODONE (OXYCONTIN) 12 hr crush resistant ER/CR tablet 20 mg: 20 mg | ORAL | @ 02:00:00 | Stop: 2020-11-24

## 2020-11-24 MED ADMIN — insulin lispro (HumaLOG) injection 0-12 Units: 0-12 [IU] | SUBCUTANEOUS | @ 15:00:00 | Stop: 2020-11-24

## 2020-11-24 MED ADMIN — silver sulfaDIAZINE (SILVADENE, SSD) 1 % cream: TOPICAL | @ 15:00:00 | Stop: 2020-11-24

## 2020-11-24 MED ADMIN — oxyCODONE (ROXICODONE) immediate release tablet 10 mg: 10 mg | ORAL | @ 21:00:00 | Stop: 2020-11-24

## 2020-11-24 NOTE — Unmapped (Signed)
A+O, VSS. PRN pain meds per [facility] for continued c/o pain to R ribs and arm. AM labs drawn. No acute events. Bed alarm activated. Call bell within reach. WCTM.     Problem: Adult Inpatient Plan of Care  Goal: Plan of Care Review  Outcome: Ongoing - Unchanged  Goal: Absence of Hospital-Acquired Illness or Injury  Outcome: Ongoing - Unchanged  Intervention: Identify and Manage Fall Risk  Recent Flowsheet Documentation  Taken 11/24/2020 0300 by Legrand Rams, RN  Safety Interventions:   bed alarm   fall reduction program maintained   lighting adjusted for tasks/safety   low bed   nonskid shoes/slippers when out of bed  Intervention: Prevent and Manage VTE (Venous Thromboembolism) Risk  Recent Flowsheet Documentation  Taken 11/24/2020 0124 by Legrand Rams, RN  Activity Management: activity adjusted per tolerance  Goal: Optimal Comfort and Wellbeing  Outcome: Ongoing - Unchanged

## 2020-11-24 NOTE — Unmapped (Addendum)
Ivan Banks is a 60 y.o. 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 with elevated bilirubin concerning for malignant biliary obstruction now s/p biliary stent.  Hospital course by problem below:    Malignant biliary obstruction s/p biliary stent  Presented with 1-2 weeks of progressive malaise, decreased appetite, weakness, and abdominal fullness, noted to have up-trending bilirubin in outpatient setting to 5.1 (direct 4.20) in the setting of heavy metastatic disease burden in the abdomen and pelvis. No signs concerning for cholangitis. Abdominal U/S showed dilation of the common bile duct and main pancreatic duct.  Consulted biliary team who performed an ERCP. Patient was found to have biliary stricture in the lower third of the main bile duct. Biliary sphincterotomy was performed and metal stent was placed in the common bile duct.  There were no postprocedural complications.      Hepatocellular Liver Injury  Patient with signs of hepatocellular liver injury beginning 2 days following stent placement. Tbili and Alk phos relatively stable so low concern for issue to related to stent. Considerations included pembrolizumab-induced liver injury (less given LFT plateau), DILI (? Heparin), post-ERCP injury (most likely). CK normal. Hepatitis B labs not consistent with acute infection. Plan to continue to trend as an outpatient.     Metastatic Sarcoma:   S/p palliative XRT, progression on olaparib, progression on doxorubicin second line, recently started on third line pembolizumab (C1D1 12/21). Follows with Dr. Dorna Mai. New intraoral lesion with CT imaging most consistent with metastatic disease as well as mets to the left upper neck and nasomaxillary junction. Too early in treatment course of pembolizumab to consider this a treatment failure. Has outpatient appointment with Dr. Dorna Mai on 1/11.    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 Echo this admission showing mildly depressed EF 45-50%, most likely the result of chemotherapeutics (doxorubicin). Discharged home with 40 PO lasix as needed for weight gain.     Hypervolemic Hyponatremia   Patient presented with Na: 129. He appeared hypervolemic on exam with 2+ pitting edema and JVD a few centimeters above the clavicle.       DVT of Right Mid Brachial Vein:   Diagnosed in September, 2021 on Eliquis, but also with extrinsic compression and luminal narrowing of right brachiocephalic vein. Continued apixaban 5 mg BID     Chemotherapy Induced Type I DM:   Last Hgb A1c 11.7, previously insulin-dependent, but with improving glycemic control and no longer on insulin. Managed on SSI as inpatient.      Chronic Pain: Follows with chronic pain. Inpatient management included oxycodone ER 20 mg BID in place of home Xtampza ER 18 mg twice daily and oxycodone 10 mg every 4 hours as needed.

## 2020-11-24 NOTE — Unmapped (Signed)
Currently admitted into the hospital

## 2020-11-24 NOTE — Unmapped (Signed)
Physician Discharge Summary [facility]  4 [facility]  [address]  [place] [address]  Dept: [phone]  Loc: [phone]     Identifying Information:   Ivan Banks  [birth year]  [id number]    Primary Care Physician: Suanne Marker, MD     Referring Physician: [name]     Code Status: Full Code    Admit Date: 11/18/2020    Discharge Date: 11/24/2020     Discharge To: Home with Home Health and/or PT/OT    Discharge Service: [facility] - Oncology Floor Team (MEDO)     Discharge Attending Physician: Gwinda Passe, MD    Discharge Diagnoses:  Principal Problem:    Elevated bilirubin  Active Problems:    Metastatic sarcoma (CMS-HCC)    Right arm pain    Acute deep vein thrombosis (DVT) of brachial vein of right upper extremity (CMS-HCC)    Hydrops of gallbladder    Bilateral lower extremity edema    Hyponatremia  Resolved Problems:    * No resolved hospital problems. *      Outpatient Provider Follow Up Issues:   Follow-up Plan after discharge:  1. Issues related to hospitalization:   a. 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 Please assess LE edema and electrolytes at next visit.  b. Hepatocellular injury. Please recheck LFTs at next visit.  2. Follow-up appointment for lab draws: 11/11  3. Follow-up appointment with [facility] Oncology: 11/11    Patient's primary oncologist and/or nurse navigator:    Warm handoff via Epic message or direct conversation?: Yes. Discharge summary sent to Dr. Dorna Mai.    Hospital Course:   Ivan Banks is a 60 y.o. 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  Hospital course by problem below:    Malignant biliary obstruction s/p biliary stent  Presented with 1-2 weeks of progressive malaise, decreased appetite, weakness, and abdominal fullness, noted to have up-trending bilirubin in outpatient setting to 5.1 (direct 4.20) in the setting of heavy metastatic disease burden in the abdomen and pelvis. No signs concerning for cholangitis. Abdominal U/S showed dilation of the common bile duct and main pancreatic duct.  Consulted biliary team who performed an ERCP. Patient was found to have biliary stricture in the lower third of the main bile duct. Biliary sphincterotomy was performed and metal stent was placed in the common bile duct.  There were no postprocedural complications.      Hepatocellular Liver Injury  Patient with signs of hepatocellular liver injury beginning 2 days following stent placement. Tbili and Alk phos relatively stable so low concern for issue to related to stent. Considerations included pembrolizumab-induced liver injury (less given LFT plateau), DILI (? Heparin), post-ERCP injury (most likely). CK normal. Hepatitis B labs not consistent with acute infection. Plan to continue to trend as an outpatient.     Metastatic Sarcoma:   S/p palliative XRT, progression on olaparib, progression on doxorubicin second line, recently started on third line pembolizumab (C1D1 12/21). Follows with Dr. Dorna Mai. New intraoral lesion with CT imaging most consistent with metastatic disease as well as mets to the left upper neck and nasomaxillary junction. Too early in treatment course of pembolizumab to consider this a treatment failure. Has outpatient appointment with Dr. Dorna Mai on 1/11.    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 Echo this admission showing mildly depressed EF 45-50%, most likely the result of chemotherapeutics (doxorubicin). Discharged home with 40 PO lasix as needed for weight gain.     Hypervolemic Hyponatremia   Patient presented with Na: 129. He appeared hypervolemic on exam with 2+ pitting edema and JVD a few centimeters above the clavicle.       DVT of Right Mid Brachial Vein:   Diagnosed in September, 2021 on Eliquis, but also with extrinsic compression and luminal narrowing of right brachiocephalic vein. Continued apixaban 5 mg BID     Chemotherapy Induced Type I DM:   Last Hgb A1c 11.7, previously insulin-dependent, but with improving glycemic control and no longer on insulin. Managed on SSI as inpatient.      Chronic Pain: Follows with chronic pain. Inpatient management included oxycodone ER 20 mg BID in place of home Xtampza ER 18 mg twice daily and oxycodone 10 mg every 4 hours as needed.    Procedures:  ERCP with biliary stent placement  No admission procedures for hospital encounter.  ______________________________________________________________________  Discharge Medications:     Your Medication List      STOP taking these medications    blood-glucose meter kit     DULoxetine 30 MG capsule  Commonly known as: CYMBALTA     glucose blood Strp  Generic drug: blood sugar diagnostic     lancets 28 gauge Misc  Commonly known as: freestyle     lancets Misc     LANTUS SOLOSTAR U-100 INSULIN 100 unit/mL (3 mL) injection pen  Generic drug: insulin glargine     pen needle, diabetic 31 gauge x 1/4 (6 mm) Ndle        START taking these medications    furosemide 40 MG tablet  Commonly known as: LASIX  Take 1 tablet for weight gain of 2 lbs from the day prior.     miscellaneous medical supply Misc  1 body scale        CONTINUE taking these medications    apixaban 5 mg Tab  Commonly known as: ELIQUIS  Take 1 tablet (5 mg total) by mouth Two (2) times a day.     cholecalciferol (vitamin D3-10 mcg (400 unit)) 10 mcg (400 unit) Cap  Take 10 mcg by mouth every morning before breakfast.     EPINEPHrine 0.3 mg/0.3 mL injection  Commonly known as: EPIPEN  Inject 0.3 mL (0.3 mg total) into the muscle once for 1 dose.     fluoride (sodium) 1.1 % Pste  Commonly known as: CLINPRO 5000  Apply to teeth daily. Brush on teeth twice daily, do not rinse, spit out excess leave on teeth for at least one hr before eating or drinking     lisinopriL 20 MG tablet  Commonly known as: PRINIVIL,ZESTRIL  Take 1 tablet (20 mg total) by mouth daily.     metFORMIN 500 MG 24 hr tablet  Commonly known as: GLUCOPHAGE-XR  Take 2 tablets (1,000 mg total) by mouth daily with evening meal.     naloxone 4 mg/actuation nasal spray  Commonly known as: NARCAN  One spray in either nostril once for known/suspected opioid overdose. May repeat every 2-3 minutes in alternating nostril til EMS arrives     oxyCODONE 10 mg immediate release tablet  Commonly known as: ROXICODONE  Take 1 tablet (10 mg total) by mouth every six (6) hours as needed for pain. Max of 4 tabs/day     polyethylene glycol 17 gram/dose  powder  Commonly known as: MIRALAX  Take 17 g by mouth two (2) times a day as needed (constipation).     silver sulfaDIAZINE 1 % cream  Commonly known as: SILVADENE, SSD  Apply to affected area daily     XTAMPZA ER 18 mg Cspt  Generic drug: oxyCODONE myristate  Take 18 mg by mouth every twelve (12) hours.     zinc gluconate 50 mg (7 mg elemental zinc) tablet  Take 50 mg by mouth daily.            Allergies:  Venom-honey bee, Aspirin, Gabapentin, and Morphine  ______________________________________________________________________  Pending Test Results (if blank, then none):  Pending Labs     Order Current Status    Anti-Nuclear Antibody (ANA) In process    Anti-Smooth Muscle Antibody, IgG In process    CMV DNA, quantitative, PCR In process    EBV QUANTITATIVE PCR, BLOOD In process          Most Recent Labs:  All lab results last 24 hours -   Recent Results (from the past 24 hour(s))   POCT Glucose    Collection Time: 11/23/20  5:36 PM   Result Value Ref Range    Glucose, POC 109 70 - 179 mg/dL   POCT Glucose    Collection Time: 11/23/20 10:32 PM   Result Value Ref Range    Glucose, POC 229 (H) 70 - 179 mg/dL   Phosphorus Level    Collection Time: 11/24/20  1:20 AM   Result Value Ref Range    Phosphorus 3.1 2.4 - 5.1 mg/dL   Magnesium Level    Collection Time: 11/24/20  1:20 AM   Result Value Ref Range    Magnesium 1.6 1.6 - 2.6 mg/dL   Comprehensive Metabolic Panel    Collection Time: 11/24/20  1:20 AM   Result Value Ref Range    Sodium 129 (L) 135 - 145 mmol/L    Potassium 4.4 3.4 - 4.5 mmol/L    Chloride 102 98 - 107 mmol/L    Anion Gap 5 5 - 14 mmol/L    CO2 22.0 20.0 - 31.0 mmol/L    BUN 27 (H) 9 - 23 mg/dL    Creatinine 1.61 0.96 - 1.10 mg/dL    BUN/Creatinine Ratio 36     EGFR CKD-EPI Non-African American, Male >90 >=60 mL/min/1.54m2    EGFR CKD-EPI African American, Male >90 >=60 mL/min/1.68m2    Glucose 188 (H) 70 - 179 mg/dL    Calcium 8.8 8.7 - 04.5 mg/dL    Albumin 2.5 (L) 3.4 - 5.0 g/dL    Total Protein 7.4 5.7 - 8.2 g/dL    Total Bilirubin 2.1 (H) 0.3 - 1.2 mg/dL    AST 409 (H) <=81 U/L    ALT 181 (H) 10 - 49 U/L    Alkaline Phosphatase 1,050 (H) 46 - 116 U/L   Type and Screen    Collection Time: 11/24/20  1:20 AM   Result Value Ref Range    ABO Grouping O POS     Antibody Screen NEG    CBC w/ Differential    Collection Time: 11/24/20  1:20 AM   Result Value Ref Range    WBC 7.0 4.5 - 11.0 10*9/L    RBC 2.63 (L) 4.50 - 5.90 10*12/L    HGB 8.7 (L) 13.5 - 17.5 g/dL    HCT 19.1 (L) 47.8 - 53.0 %    MCV 106.9 (H) 80.0 - 100.0 fL  MCH 32.9 26.0 - 34.0 pg    MCHC 30.8 (L) 31.0 - 37.0 g/dL    RDW 40.9 (H) 81.1 - 15.0 %    MPV 9.9 7.0 - 10.0 fL    Platelet 480 (H) 150 - 440 10*9/L    Neutrophils % 76.0 %    Lymphocytes % 9.8 %    Monocytes % 5.9 %    Eosinophils % 5.3 %    Basophils % 0.7 %    Absolute Neutrophils 5.4 2.0 - 7.5 10*9/L    Absolute Lymphocytes 0.7 (L) 1.5 - 5.0 10*9/L    Absolute Monocytes 0.4 0.2 - 0.8 10*9/L    Absolute Eosinophils 0.4 0.0 - 0.4 10*9/L    Absolute Basophils 0.1 0.0 - 0.1 10*9/L    Large Unstained Cells 2 0 - 4 %    Macrocytosis Marked (A) Not Present    Hypochromasia Marked (A) Not Present   POCT Glucose    Collection Time: 11/24/20  7:34 AM   Result Value Ref Range    Glucose, POC 198 (H) 70 - 179 mg/dL   POCT Glucose    Collection Time: 11/24/20 11:23 AM Result Value Ref Range    Glucose, POC 167 70 - 179 mg/dL       Relevant Studies/Radiology (if blank, then none):  XR Chest Portable    Result Date: 11/19/2020  EXAM: XR CHEST PORTABLE DATE: 11/18/2020 10:16 PM ACCESSION: [id number] UN DICTATED: 11/19/2020 8:00 AM INTERPRETATION LOCATION: [facility] CLINICAL INDICATION: 60 years old Male with SHORTNESS OF BREATH  COMPARISON: Chest radiograph 07/31/2020 TECHNIQUE: Portable Chest Radiograph. FINDINGS: Right internal jugular approach Port-A-Cath with tip in the right atrium. Streaky bibasilar interstitial and airspace opacities right greater than left. Right lower lobe atelectasis. Slight tracheal deviation secondary to atelectasis. Probable small right pleural effusion. No pneumothorax. Unremarkable cardiomediastinal silhouette.     Heterogeneous bilateral airspace opacities. Infiltrate and/or pulmonary edema suspected. Correlate clinically. Right lower lobe atelectasis and small right effusion.    CT Maxillofacial W Contrast    Result Date: 11/19/2020  EXAM: Computed tomography, maxillofacial area with contrast material. DATE: 11/19/2020 5:26 PM ACCESSION: [id number] UN DICTATED: 11/19/2020 5:34 PM INTERPRETATION LOCATION: [facility] CLINICAL INDICATION: 60 years old Male with Evaluate right - sided intraoral lesion  COMPARISON: None TECHNIQUE: Axial CT images through the face with contrast. Coronal and sagittal reformatted images, bone and soft tissue algorithm are provided. FINDINGS: There is osseous erosion with a permeative appearance of the right neck of the mandible extending into the ramus with periosteal reaction (6:68). There is ill-defined hypodensity with effacement of the right parapharyngeal fat pad and enlargement of the right pterygoid musculature. The ostium of the right alveolar canal is expanded. Additionally, there is a mass involving the musculature and intervening fat planes of the left upper neck which measures approximately 4 x 3.4 cm in cross-sectional dimensions and 3.7 cm craniocaudally (2:38 and 4:55). No adjacent osseous erosion at this site. The visualized paranasal sinuses are pneumatized and free of fluid. Multiple dental caries. The mastoid air cells are clear. The globes are intact. No radiopaque foreign bodies are seen. There is rightward deviation of the nasal septum. Mildly expansile lucent lesion measuring 1.1 x 0.6 cm at the left nasomaxillary junction (7:162). Partially imaged spiculated left upper lobe nodule better characterized on prior chest CT. Partially imaged right IJ central venous catheter. Extensive degenerative changes in the visualized upper cervical spine.     --Poorly circumscribed mass lesion involving the right masticator space with  osseous erosion of the mandible and associated effacement of the right parapharyngeal fat pad. Findings concerning for metastatic lesion with possible perineural spread with involvement of the right alveolar canal. --Additional 4 mm deep soft tissue/intramuscular mass in the left upper neck also concerning for metastasis. --1.1 cm lucent osseous lesion in the left nasomaxillary junction is indeterminate and could represent an additional metastasis versus a benign fibro-osseous lesion.    US Liver Doppler    Result Date: 11/19/2020  EXAM: US LIVER DOPPLER DATE: 11/19/2020 7:40 AM ACCESSION: [id number] UN DICTATED: 11/19/2020 7:31 AM INTERPRETATION LOCATION: [facility] CLINICAL INDICATION: 60 years old Male with biliary obs  TECHNIQUE: Ultrasound views of the complete abdomen were obtained using gray scale and color and spectral Doppler imaging. COMPARISON: CT abdomen pelvis 11/03/2020 FINDINGS: LIVER: The liver is normal in echogenicity. No focal hepatic lesions. No intrahepatic biliary ductal dilatation. Dilated common bile duct measuring up to 1.0 cm, seen on prior. GALLBLADDER: The gallbladder is distended with a mobile layering sludge. No definite cholelithiasis. Sonographic Eulah Pont sign is negative.   Trace pericholecystic fluid. Mild gallbladder wall thickening. PANCREAS: Dilated main pancreatic duct measuring up to 1.0 cm similar to prior CT dated 11/03/2020. The pancreatic neck metastatic implant is partially visualized measuring approximately 2.8 cm, better seen on prior CT. SPLEEN: Normal in size and echotexture. KIDNEYS: Normal in size. Echogenic kidneys bilaterally. No calculi. No hydronephrosis. Heterogeneous and hyperechoic vascular retroperitoneal masses posterolateral to bilateral kidneys measuring 4.8 x 4.5 x 3.4 cm in the right, and 3.8 x 2.7 x 2.6 cm on the left, seen on prior. VESSELS - Portal vein: The main, left and right portal veins are patent with hepatopetal flow. Normal main portal vein velocity (0.20 m/s or greater) - Splenic vein: Patent, with hepatopetal flow. - Hepatic veins/IVC: The IVC, left, middle and right hepatic veins are patent with bi/triphasic waveforms. - Hepatic artery: Patent -Partially visualized proximal aorta:  unremarkable OTHER: Trace ascites.Bilateral pleural effusions. Prominent peripancreatic lymph nodes again noted.     -Distended gallbladder with mild gallbladder wall thickening. No evidence of cholelithiasis. No sonographic Murphy sign. -Dilated common bile duct and main pancreatic duct similar to prior CT dated 11/03/2020. The pancreatic neck mass is partially visualized and better seen on prior CT. -Trace pericholecystic fluid or ascites. -Multiple metastatic retroperitoneal implants, which are better characterized on prior CT. -Bilateral pleural effusions and trace ascites, seen on prior CT. -Hepatomegaly. Patent hepatic vasculature with normal flow direction. Please see below for data measurements: Liver: 17.4 cm Common bile duct: 0.85-0.99 cm Gallbladder wall: 0.34 cm Sonographic Murphy's Sign: negative Pericholecystic fluid visualized: yes Right kidney: 11.7 cm Left kidney: 12.8 cm Main portal vein diameter: 1.64 cm Main portal vein velocity: 0.44 m/s Anterior right portal vein velocity: 0.28,0.29 m/s Posterior right portal vein velocity: -0.29 m/s Left portal vein velocity: 0.26 m/s Main portal vein flow: hepatopetal Right portal vein flow: hepatopetal Left portal vein flow: hepatopetal Common hepatic artery: Patent Left hepatic vein flow: bi-tri Middle hepatic vein flow: bi-tri Right hepatic vein flow: bi-tri Inferior vena cava flow: bi-tri Splenic vein midline: hepatopetal Splenic vein proximal: hepatopetal Aorta: partially visualized Inferior vena cava: Partially visualized Spleen: 10.4 cm Abdominal free fluid visualized: yes     Echocardiogram W Colorflow Spectral Doppler    Result Date: 11/21/2020  Patient Info Name:     KHAIR CHASTEEN Age:     59 years DOB:     Feb 15, 1961 Gender:     Male MRN:  [id number] Accession #:     [id number] UN Ht:     185 cm Wt:     70 kg BSA:     1.89 m2 HR:     88 bpm BP:     120 /     71 mmHg Heart Rhythm:     Sinus Rhythm Technical Quality:     Good Exam Date:     11/21/2020 8:21 AM Site Location:     [facility] Exam Location:     [facility] Admit Date:     11/18/2020 Exam Type:     ECHOCARDIOGRAM W COLORFLOW SPECTRAL DOPPLER Study Info Indications      - extremity edema,  s/p chemo Complete two-dimensional, color flow and Doppler transthoracic echocardiogram is performed. Staff Referring Physician:     [name]  ; Reading Fellow:     Cheryll Dessert MD Sonographer:     Aida Raider, RDCS Ordering Physician:     Risa Grill I Account #:     [id number] Summary   1. The left ventricle is mildly dilated in size with upper normal wall thickness.   2. The left ventricular systolic function is moderately decreased, LVEF is visually estimated at 40%.   3. There is grade II diastolic dysfunction (elevated filling pressure).   4. There is moderate mitral valve regurgitation.   5. The left atrium is moderately dilated in size.   6. The aortic valve is trileaflet with mildly thickened leaflets with normal excursion.   7. The right ventricle is mildly dilated in size, with normal systolic function.   8. There is mild to moderate tricuspid regurgitation.   9. There is moderate-severe pulmonary hypertension, estimated pulmonary artery systolic pressure is 69 mmHg.   10. IVC size and inspiratory change suggest mildly elevated right atrial pressure. (5-10 mmHg). Left Ventricle   The left ventricle is mildly dilated in size with upper normal wall thickness.   The left ventricular systolic function is moderately decreased, LVEF is visually estimated at 40%.   There is grade II diastolic dysfunction (elevated filling pressure). Right Ventricle   The right ventricle is mildly dilated in size, with normal systolic function. Ventricular Septum   Abnormal ventricular septal motion consistent with RV volume overload (diastolic flattening). Left Atrium   The left atrium is moderately dilated in size. Right Atrium   The right atrium is upper normal  in size.   There is a catheter present. Aortic Valve   The aortic valve is trileaflet with mildly thickened leaflets with normal excursion.   There is no significant aortic regurgitation.   There is no evidence of a significant transvalvular gradient. Pulmonic Valve   The pulmonic valve is poorly visualized, but probably normal.   There is trivial pulmonic regurgitation.   There is no evidence of a significant transvalvular gradient. Mitral Valve   The mitral valve leaflets are mildly thickened with normal leaflet mobility.   There is moderate mitral valve regurgitation. Tricuspid Valve   The tricuspid valve leaflets are normal, with normal leaflet mobility.   There is mild to moderate tricuspid regurgitation.   There is moderate-severe pulmonary hypertension, estimated pulmonary artery systolic pressure is 69 mmHg. Pulmonary Arteries   The pulmonary artery is not well visualized. Pericardium/Pleural   Large left and right pleural effusions.   There is no pericardial effusion.   There is a left pleural effusion and a right pleural effusion present. Inferior Vena Cava   IVC size and inspiratory change suggest mildly  elevated right atrial pressure. (5-10 mmHg). Aorta   The aorta is normal in size in the visualized segments. Left Ventricular Outflow Tract ---------------------------------------------------------------------- Name                                 Value        Normal ---------------------------------------------------------------------- LVOT 2D ---------------------------------------------------------------------- LVOT Diameter                       2.0 cm               LVOT Area                          3.1 cm2               LVOT Doppler ---------------------------------------------------------------------- LVOT Peak Velocity                 1.2 m/s               LVOT VTI                             20 cm               LVOT Stroke Volume                   64 ml               LVOT CO                          5.7 l/min               LVOT CI                        3.0 L/min/m2 Pulmonic Valve ---------------------------------------------------------------------- Name                                 Value        Normal ---------------------------------------------------------------------- PV Doppler ---------------------------------------------------------------------- PV Peak Velocity                   0.9 m/s Mitral Valve ---------------------------------------------------------------------- Name                                 Value        Normal ---------------------------------------------------------------------- MV Diastolic Function ---------------------------------------------------------------------- MV E Peak Velocity                132 cm/s               MV A Peak Velocity                113 cm/s               MV E/A                                 1.2               MV Annular TDI ---------------------------------------------------------------------- MV Septal e' Velocity  7.1 cm/s         >=8.0 MV Lateral e' Velocity           10.3 cm/s        >=10.0 MV e' Average                          8.7               MV E/e' (Average)                     15.8 Tricuspid Valve ---------------------------------------------------------------------- Name                                 Value        Normal ---------------------------------------------------------------------- TV Regurgitation Doppler ---------------------------------------------------------------------- TR Peak Velocity                     4 m/s               Estimated PAP/RSVP ---------------------------------------------------------------------- RA Pressure                         8 mmHg           <=5 RV Systolic Pressure               69 mmHg           <36 Aorta ---------------------------------------------------------------------- Name                                 Value        Normal ---------------------------------------------------------------------- Ascending Aorta ---------------------------------------------------------------------- Ao Root Diameter (2D)               2.9 cm               Ao Root Diam Index (2D)         15.4 cm/m2 Venous ---------------------------------------------------------------------- Name                                 Value        Normal ---------------------------------------------------------------------- IVC/SVC ---------------------------------------------------------------------- IVC Diameter (Exp 2D)               1.7 cm         <=2.1 Aortic Valve ---------------------------------------------------------------------- Name                                 Value        Normal ---------------------------------------------------------------------- AV Doppler ---------------------------------------------------------------------- AV Peak Velocity                   1.1 m/s               AV Area (Cont Eq Vel)              3.4 cm2               AV Area Index (Cont Eq Vel)     1.8 cm2/m2               AV V1/V2 Ratio  1.09 Ventricles ---------------------------------------------------------------------- Name                                 Value        Normal ---------------------------------------------------------------------- LV Dimensions 2D/MM ---------------------------------------------------------------------- IVS Diastolic Thickness (2D)        1.0 cm       0.6-1.0 LVID Diastole (2D)                  5.7 cm       4.2-5.8 LVPW Diastolic Thickness (2D)                                1.0 cm       0.6-1.0 LVID Systole (2D)                   4.0 cm       2.5-4.0 LVOT Diameter                       2.0 cm               RV Dimensions 2D/MM ---------------------------------------------------------------------- RV Basal Diastolic Dimension        4.4 cm       2.5-4.1 TAPSE                               1.8 cm         >=1.7 Atria ---------------------------------------------------------------------- Name                                 Value        Normal ---------------------------------------------------------------------- LA Dimensions ---------------------------------------------------------------------- LA Dimension (2D)                   4.3 cm       3.0-4.1 LA Volume (BP MOD)                   83 ml               LA Volume Index (BP MOD)       42.90 ml/m2   16.00-34.00 RA Dimensions ---------------------------------------------------------------------- RA Area (4C)                      15.3 cm2        <=18.0 RA Area (4C) Index              8.1 cm2/m2 Report Signatures Finalized by Kennis Carina  MD on 11/21/2020 01:41 PM Resident Cheryll Dessert  MD on 11/21/2020 10:12 AM    [facility] (No Interpretation)    Result Date: 11/20/2020  This order was placed for internal RIS purposes.  This order does not require a diagnostic report. lpb    ______________________________________________________________________  Discharge Instructions:     Activity Instructions     Activity as tolerated                Other Instructions     Call MD for:  difficulty breathing, headache or visual disturbances      Call MD for:  persistent nausea or vomiting      Call MD for:  severe uncontrolled pain  Call MD for:  temperature >38.5 Celsius      Discharge instructions      You were admitted to the hospital for elevated liver enzymes that suggested you had an obstruction near your liver.  You had a procedure called an ERCP to relieve this obstruction.  You are now ready to be discharged home    MEDICATION CHANGES:  **We are discharging you on a medication called Lasix.  Please follow the instructions below in order to dose Lasix appropriately**  - Please obtain a scale and weigh yourself tomorrow morning after urinating  - Write down this weight (this is your dry weight)  - Then weigh yourself every morning after urinating and write down your weight  - If you gain two pounds from the day prior take 1 tablet lasix    --I have refilled your OxyContin and as needed oxycodone.  Further refills can be prescribed by Dr. Dorna Mai.    FOLLOW-UP:  - We will arrange follow-up for you with Dr. Dorna Mai within the next week to evaluate your liver enzymes and right-sided pain  -- Please attend your appointment in lymphedema clinic on 1/19    Please return to the hospital if you experience worsening abdominal pain, chest pain, shortness of breath, or any other concerning symptoms.     It was a pleasure taking care of you and we wish you all the best!          Follow Up instructions and Outpatient Referrals     Ambulatory referral to Home Health      Is this a [facility] or [facility] Patient?: Yes    Do you want to initiate remote patient monitoring?: No    Physician to follow patient's care: Other: (please enter in comments)   Comment - Dr. Jeneen Montgomery    Disciplines requested:  Nursing  Physical Therapy       Nursing requested: Other: (please enter in comments) Comment - disease   and med management and education    Physical Therapy requested: Evaluate and treat    Call MD for:  difficulty breathing, headache or visual disturbances      Call MD for:  persistent nausea or vomiting      Call MD for:  severe uncontrolled pain      Call MD for:  temperature >38.5 Celsius      Discharge instructions          Appointments which have been scheduled for you    Dec 02, 2020 12:00 PM  (Arrive by 11:30 AM)  RETURN ACTIVE Gregory with [name], MD  [place] [facility]) [address]  [place] [address]  [phone]      Dec 02, 2020 12:30 PM  (Arrive by 12:00 PM)  RETURN PALLIATIVE CARE with Burnis Kingfisher, FNP  [facility]) [address]  [place] [address]  [phone]      Jan 12, 2021  3:30 PM  (Arrive by 3:15 PM)  NEW  DIABETES with Tarri Abernethy, FNP  [facility] [place] [facility]) [address]  [place] [address]  [phone]           ______________________________________________________________________  Discharge Day Services:  BP 129/67  - Pulse 95  - Temp 36.8 ??C (Oral)  - Resp 16  - Ht 185.4  cm (6' 1)  - Wt 69.9 kg (154 lb)  - SpO2 99%  - BMI 20.32 kg/m??   Pt seen on the day of discharge and determined appropriate for discharge.    Condition at Discharge: stable    Length of Discharge: I spent greater than 30 mins in the discharge of this patient.

## 2020-11-24 NOTE — Unmapped (Signed)
Pt alert and oriented x4, VSS afebrile; breathing even and unlabored on RA; pt OOB with standby assist and walker, no falls this shift; pt with LE edema, TED hose placed, R arm with lymphedema and XRT burn, silverdene applied; bed in lowest position, call bell within reach

## 2020-11-25 LAB — ANTI-SMOOTH MUSCLE ANTIBODY, IGG: SMOOTH MUSCLE ANTIBODY: NEGATIVE

## 2020-11-25 NOTE — Unmapped (Signed)
This patient has been disenrolled from the [facility] specialty pharmacy services due to Mr. Baack no longer on Udenyca at this time.Kermit Balo  [facility] Specialty Pharmacist

## 2020-11-25 NOTE — Unmapped (Signed)
Ivan Banks was discharged. Instructions, medications, and follow up appointment reviewed with patient, with stated understanding. R chest power port de-accessed and heparin locked per protocol and PIV removed. Pt discharged with all belongings.

## 2020-11-25 NOTE — Unmapped (Signed)
No longer taking Udenyca he's on pembro now.

## 2020-12-02 ENCOUNTER — Ambulatory Visit: Admit: 2020-12-02 | Discharge: 2020-12-02 | Payer: PRIVATE HEALTH INSURANCE

## 2020-12-02 ENCOUNTER — Ambulatory Visit
Admit: 2020-12-02 | Discharge: 2020-12-02 | Payer: PRIVATE HEALTH INSURANCE | Attending: Student in an Organized Health Care Education/Training Program | Primary: Student in an Organized Health Care Education/Training Program

## 2020-12-02 ENCOUNTER — Other Ambulatory Visit: Admit: 2020-12-02 | Discharge: 2020-12-02 | Payer: PRIVATE HEALTH INSURANCE

## 2020-12-02 DIAGNOSIS — Z5112 Encounter for antineoplastic immunotherapy: Principal | ICD-10-CM

## 2020-12-02 DIAGNOSIS — C4911 Malignant neoplasm of connective and soft tissue of right upper limb, including shoulder: Principal | ICD-10-CM

## 2020-12-02 DIAGNOSIS — G893 Neoplasm related pain (acute) (chronic): Principal | ICD-10-CM

## 2020-12-02 DIAGNOSIS — R10819 Abdominal tenderness, unspecified site: Principal | ICD-10-CM

## 2020-12-02 DIAGNOSIS — C7800 Secondary malignant neoplasm of unspecified lung: Principal | ICD-10-CM

## 2020-12-02 DIAGNOSIS — Z923 Personal history of irradiation: Principal | ICD-10-CM

## 2020-12-02 DIAGNOSIS — M545 Low back pain, unspecified: Principal | ICD-10-CM

## 2020-12-02 DIAGNOSIS — Z809 Family history of malignant neoplasm, unspecified: Principal | ICD-10-CM

## 2020-12-02 DIAGNOSIS — R1011 Right upper quadrant pain: Principal | ICD-10-CM

## 2020-12-02 DIAGNOSIS — Z85831 Personal history of malignant neoplasm of soft tissue: Principal | ICD-10-CM

## 2020-12-02 DIAGNOSIS — M79621 Pain in right upper arm: Principal | ICD-10-CM

## 2020-12-02 DIAGNOSIS — Z885 Allergy status to narcotic agent status: Principal | ICD-10-CM

## 2020-12-02 DIAGNOSIS — S2231XD Fracture of one rib, right side, subsequent encounter for fracture with routine healing: Principal | ICD-10-CM

## 2020-12-02 DIAGNOSIS — R140 Abdominal distension (gaseous): Principal | ICD-10-CM

## 2020-12-02 DIAGNOSIS — C7989 Secondary malignant neoplasm of other specified sites: Principal | ICD-10-CM

## 2020-12-02 DIAGNOSIS — Z888 Allergy status to other drugs, medicaments and biological substances status: Principal | ICD-10-CM

## 2020-12-02 DIAGNOSIS — F1910 Other psychoactive substance abuse, uncomplicated: Principal | ICD-10-CM

## 2020-12-02 DIAGNOSIS — I10 Essential (primary) hypertension: Principal | ICD-10-CM

## 2020-12-02 DIAGNOSIS — R630 Anorexia: Principal | ICD-10-CM

## 2020-12-02 DIAGNOSIS — Z79899 Other long term (current) drug therapy: Principal | ICD-10-CM

## 2020-12-02 DIAGNOSIS — E1040 Type 1 diabetes mellitus with diabetic neuropathy, unspecified: Principal | ICD-10-CM

## 2020-12-02 DIAGNOSIS — Z7901 Long term (current) use of anticoagulants: Principal | ICD-10-CM

## 2020-12-02 DIAGNOSIS — G8929 Other chronic pain: Principal | ICD-10-CM

## 2020-12-02 DIAGNOSIS — F17200 Nicotine dependence, unspecified, uncomplicated: Principal | ICD-10-CM

## 2020-12-02 DIAGNOSIS — Z7984 Long term (current) use of oral hypoglycemic drugs: Principal | ICD-10-CM

## 2020-12-02 DIAGNOSIS — C499 Malignant neoplasm of connective and soft tissue, unspecified: Principal | ICD-10-CM

## 2020-12-02 DIAGNOSIS — M79601 Pain in right arm: Principal | ICD-10-CM

## 2020-12-02 DIAGNOSIS — Z515 Encounter for palliative care: Principal | ICD-10-CM

## 2020-12-02 DIAGNOSIS — G629 Polyneuropathy, unspecified: Principal | ICD-10-CM

## 2020-12-02 MED ORDER — PREGABALIN 25 MG CAPSULE
ORAL_CAPSULE | ORAL | 0 refills | 0.00000 days | Status: SS
Start: 2020-12-02 — End: 2020-12-15

## 2020-12-04 MED ORDER — OXYCODONE 10 MG TABLET
ORAL_TABLET | Freq: Four times a day (QID) | ORAL | 0 refills | 30.00000 days | Status: CP | PRN
Start: 2020-12-04 — End: 2020-12-23

## 2020-12-04 MED ORDER — XTAMPZA ER 18 MG CAPSULE SPRINKLE
Freq: Two times a day (BID) | ORAL | 0 refills | 30.00000 days | Status: CP
Start: 2020-12-04 — End: 2020-12-23

## 2020-12-09 ENCOUNTER — Ambulatory Visit
Admit: 2020-12-09 | Discharge: 2020-12-10 | Disposition: A | Discharge status: Other Acute Inpatient Hospital | Payer: PRIVATE HEALTH INSURANCE | Admitting: Student in an Organized Health Care Education/Training Program

## 2020-12-09 DIAGNOSIS — B998 Other infectious disease: Principal | ICD-10-CM

## 2020-12-09 DIAGNOSIS — K922 Gastrointestinal hemorrhage, unspecified: Principal | ICD-10-CM

## 2020-12-09 DIAGNOSIS — D649 Anemia, unspecified: Principal | ICD-10-CM

## 2020-12-09 DIAGNOSIS — R109 Unspecified abdominal pain: Principal | ICD-10-CM

## 2020-12-09 DIAGNOSIS — Z7901 Long term (current) use of anticoagulants: Principal | ICD-10-CM

## 2020-12-09 DIAGNOSIS — T85898A Other specified complication of other internal prosthetic devices, implants and grafts, initial encounter: Principal | ICD-10-CM

## 2020-12-09 DIAGNOSIS — Z7984 Long term (current) use of oral hypoglycemic drugs: Principal | ICD-10-CM

## 2020-12-09 DIAGNOSIS — C499 Malignant neoplasm of connective and soft tissue, unspecified: Principal | ICD-10-CM

## 2020-12-09 DIAGNOSIS — I10 Essential (primary) hypertension: Principal | ICD-10-CM

## 2020-12-09 DIAGNOSIS — A419 Sepsis, unspecified organism: Principal | ICD-10-CM

## 2020-12-09 DIAGNOSIS — R578 Other shock: Principal | ICD-10-CM

## 2020-12-09 DIAGNOSIS — G893 Neoplasm related pain (acute) (chronic): Principal | ICD-10-CM

## 2020-12-09 DIAGNOSIS — E119 Type 2 diabetes mellitus without complications: Principal | ICD-10-CM

## 2020-12-09 DIAGNOSIS — C799 Secondary malignant neoplasm of unspecified site: Principal | ICD-10-CM

## 2020-12-09 DIAGNOSIS — I82621 Acute embolism and thrombosis of deep veins of right upper extremity: Principal | ICD-10-CM

## 2020-12-09 DIAGNOSIS — Z20822 Contact with and (suspected) exposure to covid-19: Principal | ICD-10-CM

## 2020-12-10 ENCOUNTER — Telehealth (HOSPITAL_COMMUNITY): Payer: Self-pay | Admitting: Specialist

## 2020-12-10 ENCOUNTER — Ambulatory Visit (HOSPITAL_COMMUNITY): Payer: Medicaid Other | Attending: Vascular Surgery | Admitting: Physical Therapy

## 2020-12-10 ENCOUNTER — Ambulatory Visit
Admit: 2020-12-10 | Discharge: 2020-12-17 | Disposition: A | Discharge status: Home / Self Care | Payer: PRIVATE HEALTH INSURANCE | Source: Other Acute Inpatient Hospital | Admitting: Hematology & Oncology

## 2020-12-10 ENCOUNTER — Encounter
Admit: 2020-12-10 | Discharge: 2020-12-17 | Disposition: A | Discharge status: Home / Self Care | Payer: PRIVATE HEALTH INSURANCE | Source: Other Acute Inpatient Hospital | Attending: Anesthesiology | Admitting: Hematology & Oncology

## 2020-12-10 DIAGNOSIS — I82621 Acute embolism and thrombosis of deep veins of right upper extremity: Principal | ICD-10-CM

## 2020-12-10 DIAGNOSIS — I5020 Unspecified systolic (congestive) heart failure: Principal | ICD-10-CM

## 2020-12-10 DIAGNOSIS — Z20822 Contact with and (suspected) exposure to covid-19: Principal | ICD-10-CM

## 2020-12-10 DIAGNOSIS — T8579XA Infection and inflammatory reaction due to other internal prosthetic devices, implants and grafts, initial encounter: Principal | ICD-10-CM

## 2020-12-10 DIAGNOSIS — E43 Unspecified severe protein-calorie malnutrition: Principal | ICD-10-CM

## 2020-12-10 DIAGNOSIS — E875 Hyperkalemia: Principal | ICD-10-CM

## 2020-12-10 DIAGNOSIS — D62 Acute posthemorrhagic anemia: Principal | ICD-10-CM

## 2020-12-10 DIAGNOSIS — T85858A Stenosis due to other internal prosthetic devices, implants and grafts, initial encounter: Principal | ICD-10-CM

## 2020-12-10 DIAGNOSIS — F17210 Nicotine dependence, cigarettes, uncomplicated: Principal | ICD-10-CM

## 2020-12-10 DIAGNOSIS — Z794 Long term (current) use of insulin: Principal | ICD-10-CM

## 2020-12-10 DIAGNOSIS — K922 Gastrointestinal hemorrhage, unspecified: Principal | ICD-10-CM

## 2020-12-10 DIAGNOSIS — K838 Other specified diseases of biliary tract: Principal | ICD-10-CM

## 2020-12-10 DIAGNOSIS — B998 Other infectious disease: Principal | ICD-10-CM

## 2020-12-10 DIAGNOSIS — Z923 Personal history of irradiation: Principal | ICD-10-CM

## 2020-12-10 DIAGNOSIS — K921 Melena: Principal | ICD-10-CM

## 2020-12-10 DIAGNOSIS — G893 Neoplasm related pain (acute) (chronic): Principal | ICD-10-CM

## 2020-12-10 DIAGNOSIS — E871 Hypo-osmolality and hyponatremia: Principal | ICD-10-CM

## 2020-12-10 DIAGNOSIS — I248 Other forms of acute ischemic heart disease: Principal | ICD-10-CM

## 2020-12-10 DIAGNOSIS — I110 Hypertensive heart disease with heart failure: Principal | ICD-10-CM

## 2020-12-10 DIAGNOSIS — K831 Obstruction of bile duct: Principal | ICD-10-CM

## 2020-12-10 DIAGNOSIS — E119 Type 2 diabetes mellitus without complications: Principal | ICD-10-CM

## 2020-12-10 DIAGNOSIS — R109 Unspecified abdominal pain: Principal | ICD-10-CM

## 2020-12-10 DIAGNOSIS — Z6822 Body mass index (BMI) 22.0-22.9, adult: Principal | ICD-10-CM

## 2020-12-10 DIAGNOSIS — R578 Other shock: Principal | ICD-10-CM

## 2020-12-10 DIAGNOSIS — J910 Malignant pleural effusion: Principal | ICD-10-CM

## 2020-12-10 DIAGNOSIS — Z7901 Long term (current) use of anticoagulants: Principal | ICD-10-CM

## 2020-12-10 DIAGNOSIS — C799 Secondary malignant neoplasm of unspecified site: Principal | ICD-10-CM

## 2020-12-10 DIAGNOSIS — J9601 Acute respiratory failure with hypoxia: Principal | ICD-10-CM

## 2020-12-10 DIAGNOSIS — I10 Essential (primary) hypertension: Principal | ICD-10-CM

## 2020-12-10 DIAGNOSIS — Z7984 Long term (current) use of oral hypoglycemic drugs: Principal | ICD-10-CM

## 2020-12-10 DIAGNOSIS — N170 Acute kidney failure with tubular necrosis: Principal | ICD-10-CM

## 2020-12-10 DIAGNOSIS — T451X5A Adverse effect of antineoplastic and immunosuppressive drugs, initial encounter: Principal | ICD-10-CM

## 2020-12-10 DIAGNOSIS — T85898A Other specified complication of other internal prosthetic devices, implants and grafts, initial encounter: Principal | ICD-10-CM

## 2020-12-10 DIAGNOSIS — A419 Sepsis, unspecified organism: Principal | ICD-10-CM

## 2020-12-10 DIAGNOSIS — D649 Anemia, unspecified: Principal | ICD-10-CM

## 2020-12-10 DIAGNOSIS — C499 Malignant neoplasm of connective and soft tissue, unspecified: Principal | ICD-10-CM

## 2020-12-10 DIAGNOSIS — R5381 Other malaise: Principal | ICD-10-CM

## 2020-12-10 NOTE — Telephone Encounter (Signed)
no show for 1/19 eval, called and left vm to call and r/s for week of 1/25 - if he calls back offer 1/25 at 10 am Vangie Bicker, [place], OTR/L [phone]

## 2020-12-11 ENCOUNTER — Ambulatory Visit (HOSPITAL_COMMUNITY): Payer: Medicaid Other

## 2020-12-16 ENCOUNTER — Ambulatory Visit (HOSPITAL_COMMUNITY): Payer: Medicaid Other | Admitting: Physical Therapy

## 2020-12-18 ENCOUNTER — Encounter (HOSPITAL_COMMUNITY): Payer: Medicaid Other | Admitting: Physical Therapy

## 2020-12-19 ENCOUNTER — Encounter (HOSPITAL_COMMUNITY): Payer: Medicaid Other

## 2020-12-22 ENCOUNTER — Encounter (HOSPITAL_COMMUNITY): Payer: Medicaid Other | Admitting: Physical Therapy

## 2020-12-23 ENCOUNTER — Ambulatory Visit: Admit: 2020-12-23 | Discharge: 2020-12-24 | Payer: PRIVATE HEALTH INSURANCE

## 2020-12-23 ENCOUNTER — Other Ambulatory Visit: Admit: 2020-12-23 | Discharge: 2020-12-24 | Payer: PRIVATE HEALTH INSURANCE

## 2020-12-23 ENCOUNTER — Ambulatory Visit
Admit: 2020-12-23 | Discharge: 2020-12-24 | Payer: PRIVATE HEALTH INSURANCE | Attending: Student in an Organized Health Care Education/Training Program | Primary: Student in an Organized Health Care Education/Training Program

## 2020-12-23 DIAGNOSIS — C779 Secondary and unspecified malignant neoplasm of lymph node, unspecified: Principal | ICD-10-CM

## 2020-12-23 DIAGNOSIS — Z888 Allergy status to other drugs, medicaments and biological substances status: Principal | ICD-10-CM

## 2020-12-23 DIAGNOSIS — Z923 Personal history of irradiation: Principal | ICD-10-CM

## 2020-12-23 DIAGNOSIS — Z9221 Personal history of antineoplastic chemotherapy: Principal | ICD-10-CM

## 2020-12-23 DIAGNOSIS — E1040 Type 1 diabetes mellitus with diabetic neuropathy, unspecified: Principal | ICD-10-CM

## 2020-12-23 DIAGNOSIS — G629 Polyneuropathy, unspecified: Principal | ICD-10-CM

## 2020-12-23 DIAGNOSIS — Z85831 Personal history of malignant neoplasm of soft tissue: Principal | ICD-10-CM

## 2020-12-23 DIAGNOSIS — C7800 Secondary malignant neoplasm of unspecified lung: Principal | ICD-10-CM

## 2020-12-23 DIAGNOSIS — G893 Neoplasm related pain (acute) (chronic): Principal | ICD-10-CM

## 2020-12-23 DIAGNOSIS — Z9181 History of falling: Principal | ICD-10-CM

## 2020-12-23 DIAGNOSIS — M545 Low back pain, unspecified: Principal | ICD-10-CM

## 2020-12-23 DIAGNOSIS — Z794 Long term (current) use of insulin: Principal | ICD-10-CM

## 2020-12-23 DIAGNOSIS — C7989 Secondary malignant neoplasm of other specified sites: Principal | ICD-10-CM

## 2020-12-23 DIAGNOSIS — Z886 Allergy status to analgesic agent status: Principal | ICD-10-CM

## 2020-12-23 DIAGNOSIS — Z7984 Long term (current) use of oral hypoglycemic drugs: Principal | ICD-10-CM

## 2020-12-23 DIAGNOSIS — I5022 Chronic systolic (congestive) heart failure: Principal | ICD-10-CM

## 2020-12-23 DIAGNOSIS — Z885 Allergy status to narcotic agent status: Principal | ICD-10-CM

## 2020-12-23 DIAGNOSIS — M79621 Pain in right upper arm: Principal | ICD-10-CM

## 2020-12-23 DIAGNOSIS — R5381 Other malaise: Principal | ICD-10-CM

## 2020-12-23 DIAGNOSIS — C499 Malignant neoplasm of connective and soft tissue, unspecified: Principal | ICD-10-CM

## 2020-12-23 DIAGNOSIS — Z79899 Other long term (current) drug therapy: Principal | ICD-10-CM

## 2020-12-23 DIAGNOSIS — C493 Malignant neoplasm of connective and soft tissue of thorax: Principal | ICD-10-CM

## 2020-12-23 DIAGNOSIS — I110 Hypertensive heart disease with heart failure: Principal | ICD-10-CM

## 2020-12-23 MED ORDER — PREGABALIN 25 MG CAPSULE
ORAL_CAPSULE | Freq: Every evening | ORAL | 0 refills | 60 days | Status: CP
Start: 2020-12-23 — End: 2021-01-22

## 2020-12-23 MED ORDER — XTAMPZA ER 18 MG CAPSULE SPRINKLE
Freq: Two times a day (BID) | ORAL | 0 refills | 30 days | Status: CP
Start: 2020-12-23 — End: 2021-01-22

## 2020-12-23 MED ORDER — OXYCODONE 10 MG TABLET
ORAL_TABLET | Freq: Four times a day (QID) | ORAL | 0 refills | 30.00000 days | Status: CP | PRN
Start: 2020-12-23 — End: 2021-01-22

## 2020-12-24 ENCOUNTER — Encounter (HOSPITAL_COMMUNITY): Payer: Medicaid Other

## 2020-12-26 ENCOUNTER — Encounter (HOSPITAL_COMMUNITY): Payer: Medicaid Other

## 2020-12-29 ENCOUNTER — Encounter (HOSPITAL_COMMUNITY): Payer: Medicaid Other | Admitting: Physical Therapy

## 2020-12-30 ENCOUNTER — Ambulatory Visit
Admit: 2020-12-30 | Discharge: 2020-12-31 | Payer: PRIVATE HEALTH INSURANCE | Attending: Student in an Organized Health Care Education/Training Program | Primary: Student in an Organized Health Care Education/Training Program

## 2020-12-30 ENCOUNTER — Ambulatory Visit: Admit: 2020-12-30 | Discharge: 2020-12-31 | Payer: PRIVATE HEALTH INSURANCE

## 2020-12-30 ENCOUNTER — Other Ambulatory Visit: Admit: 2020-12-30 | Discharge: 2020-12-31 | Payer: PRIVATE HEALTH INSURANCE

## 2020-12-30 DIAGNOSIS — E119 Type 2 diabetes mellitus without complications: Principal | ICD-10-CM

## 2020-12-30 DIAGNOSIS — Z5111 Encounter for antineoplastic chemotherapy: Principal | ICD-10-CM

## 2020-12-30 DIAGNOSIS — G8918 Other acute postprocedural pain: Principal | ICD-10-CM

## 2020-12-30 DIAGNOSIS — J90 Pleural effusion, not elsewhere classified: Principal | ICD-10-CM

## 2020-12-30 DIAGNOSIS — Z7984 Long term (current) use of oral hypoglycemic drugs: Principal | ICD-10-CM

## 2020-12-30 DIAGNOSIS — Z85831 Personal history of malignant neoplasm of soft tissue: Principal | ICD-10-CM

## 2020-12-30 DIAGNOSIS — C7989 Secondary malignant neoplasm of other specified sites: Principal | ICD-10-CM

## 2020-12-30 DIAGNOSIS — Z79899 Other long term (current) drug therapy: Principal | ICD-10-CM

## 2020-12-30 DIAGNOSIS — C499 Malignant neoplasm of connective and soft tissue, unspecified: Principal | ICD-10-CM

## 2020-12-30 DIAGNOSIS — G8929 Other chronic pain: Principal | ICD-10-CM

## 2020-12-30 DIAGNOSIS — I10 Essential (primary) hypertension: Principal | ICD-10-CM

## 2020-12-30 DIAGNOSIS — Z79891 Long term (current) use of opiate analgesic: Principal | ICD-10-CM

## 2020-12-30 DIAGNOSIS — M549 Dorsalgia, unspecified: Principal | ICD-10-CM

## 2020-12-31 ENCOUNTER — Encounter (HOSPITAL_COMMUNITY): Payer: Medicaid Other

## 2021-01-02 ENCOUNTER — Encounter (HOSPITAL_COMMUNITY): Payer: Medicaid Other

## 2021-01-02 ENCOUNTER — Ambulatory Visit: Admit: 2021-01-02 | Discharge: 2021-01-02 | Payer: PRIVATE HEALTH INSURANCE

## 2021-01-02 DIAGNOSIS — M549 Dorsalgia, unspecified: Principal | ICD-10-CM

## 2021-01-02 DIAGNOSIS — J90 Pleural effusion, not elsewhere classified: Principal | ICD-10-CM

## 2021-01-02 DIAGNOSIS — Z79891 Long term (current) use of opiate analgesic: Principal | ICD-10-CM

## 2021-01-02 DIAGNOSIS — G8918 Other acute postprocedural pain: Principal | ICD-10-CM

## 2021-01-02 DIAGNOSIS — G8929 Other chronic pain: Principal | ICD-10-CM

## 2021-01-02 DIAGNOSIS — I10 Essential (primary) hypertension: Principal | ICD-10-CM

## 2021-01-02 DIAGNOSIS — Z79899 Other long term (current) drug therapy: Principal | ICD-10-CM

## 2021-01-02 DIAGNOSIS — Z7984 Long term (current) use of oral hypoglycemic drugs: Principal | ICD-10-CM

## 2021-01-02 DIAGNOSIS — E119 Type 2 diabetes mellitus without complications: Principal | ICD-10-CM

## 2021-01-05 ENCOUNTER — Encounter (HOSPITAL_COMMUNITY): Payer: Medicaid Other | Admitting: Physical Therapy

## 2021-01-07 ENCOUNTER — Encounter (HOSPITAL_COMMUNITY): Payer: Medicaid Other

## 2021-01-09 ENCOUNTER — Encounter (HOSPITAL_COMMUNITY): Payer: Medicaid Other

## 2021-01-12 ENCOUNTER — Encounter (HOSPITAL_COMMUNITY): Payer: Medicaid Other | Admitting: Physical Therapy

## 2021-01-12 ENCOUNTER — Ambulatory Visit: Admit: 2021-01-12 | Discharge: 2021-01-13 | Payer: PRIVATE HEALTH INSURANCE

## 2021-01-12 DIAGNOSIS — E0965 Drug or chemical induced diabetes mellitus with hyperglycemia: Principal | ICD-10-CM

## 2021-01-12 DIAGNOSIS — Z794 Long term (current) use of insulin: Principal | ICD-10-CM

## 2021-01-12 DIAGNOSIS — E119 Type 2 diabetes mellitus without complications: Principal | ICD-10-CM

## 2021-01-12 MED ORDER — METFORMIN ER 500 MG TABLET,EXTENDED RELEASE 24 HR
ORAL_TABLET | Freq: Two times a day (BID) | ORAL | 3 refills | 90.00000 days | Status: CP
Start: 2021-01-12 — End: ?

## 2021-01-14 ENCOUNTER — Encounter (HOSPITAL_COMMUNITY): Payer: Medicaid Other

## 2021-01-16 ENCOUNTER — Encounter (HOSPITAL_COMMUNITY): Payer: Medicaid Other

## 2021-01-19 ENCOUNTER — Encounter (HOSPITAL_COMMUNITY): Payer: Medicaid Other | Admitting: Physical Therapy

## 2021-01-21 ENCOUNTER — Encounter (HOSPITAL_COMMUNITY): Payer: Medicaid Other

## 2021-01-21 ENCOUNTER — Ambulatory Visit
Admit: 2021-01-21 | Discharge: 2021-01-25 | Disposition: A | Discharge status: Home Health | Payer: PRIVATE HEALTH INSURANCE | Admitting: Hematology & Oncology

## 2021-01-21 ENCOUNTER — Ambulatory Visit: Admit: 2021-01-21 | Discharge: 2021-01-21 | Payer: PRIVATE HEALTH INSURANCE

## 2021-01-21 ENCOUNTER — Encounter
Admit: 2021-01-21 | Discharge: 2021-01-25 | Disposition: A | Discharge status: Home Health | Payer: PRIVATE HEALTH INSURANCE | Attending: Anesthesiology | Admitting: Hematology & Oncology

## 2021-01-21 ENCOUNTER — Encounter
Admit: 2021-01-21 | Discharge: 2021-01-25 | Disposition: A | Discharge status: Home Health | Payer: PRIVATE HEALTH INSURANCE | Admitting: Hematology & Oncology

## 2021-01-23 ENCOUNTER — Encounter (HOSPITAL_COMMUNITY): Payer: Medicaid Other

## 2021-01-25 MED ORDER — OXYCODONE 10 MG TABLET
ORAL_TABLET | Freq: Four times a day (QID) | ORAL | 0 refills | 30 days | Status: CP | PRN
Start: 2021-01-25 — End: 2021-01-30

## 2021-01-25 MED ORDER — PANTOPRAZOLE 40 MG TABLET,DELAYED RELEASE
ORAL_TABLET | Freq: Two times a day (BID) | ORAL | 0 refills | 30 days | Status: CP
Start: 2021-01-25 — End: 2021-02-24

## 2021-01-25 MED ORDER — PREGABALIN 25 MG CAPSULE
ORAL_CAPSULE | Freq: Every evening | ORAL | 0 refills | 60 days | Status: CP
Start: 2021-01-25 — End: 2021-02-24

## 2021-01-25 MED ORDER — OXYCODONE ER 20 MG TABLET,CRUSH RESISTANT,EXTENDED RELEASE 12 HR
ORAL_TABLET | Freq: Two times a day (BID) | ORAL | 0 refills | 5 days | Status: CN
Start: 2021-01-25 — End: 2021-01-30

## 2021-01-25 MED ORDER — XTAMPZA ER 18 MG CAPSULE SPRINKLE
Freq: Two times a day (BID) | ORAL | 0 refills | 14.00000 days | Status: CP
Start: 2021-01-25 — End: 2021-02-08

## 2021-01-27 ENCOUNTER — Emergency Department: Admit: 2021-01-27 | Discharge: 2021-02-20 | Disposition: E | Payer: PRIVATE HEALTH INSURANCE

## 2021-01-27 ENCOUNTER — Ambulatory Visit: Admit: 2021-01-27 | Discharge: 2021-02-20 | Disposition: E | Payer: PRIVATE HEALTH INSURANCE

## 2021-01-27 DIAGNOSIS — R58 Hemorrhage, not elsewhere classified: Principal | ICD-10-CM

## 2021-01-27 DIAGNOSIS — K922 Gastrointestinal hemorrhage, unspecified: Principal | ICD-10-CM

## 2021-01-27 DIAGNOSIS — R4182 Altered mental status, unspecified: Principal | ICD-10-CM

## 2021-01-27 DIAGNOSIS — I9589 Other hypotension: Principal | ICD-10-CM

## 2021-02-20 DEATH — deceased
